# Patient Record
Sex: Female | Born: 1940 | Race: Black or African American | Hispanic: No | Smoking: Former smoker
Health system: Southern US, Community
[De-identification: ages and names within clinical notes are randomized; demographics above are authoritative.]

## PROBLEM LIST (undated history)

## (undated) DIAGNOSIS — I10 Essential (primary) hypertension: Secondary | ICD-10-CM

## (undated) DIAGNOSIS — E785 Hyperlipidemia, unspecified: Secondary | ICD-10-CM

## (undated) DIAGNOSIS — I951 Orthostatic hypotension: Secondary | ICD-10-CM

## (undated) DIAGNOSIS — F0391 Unspecified dementia with behavioral disturbance: Secondary | ICD-10-CM

## (undated) DIAGNOSIS — R634 Abnormal weight loss: Secondary | ICD-10-CM

## (undated) DIAGNOSIS — E049 Nontoxic goiter, unspecified: Secondary | ICD-10-CM

## (undated) DIAGNOSIS — M719 Bursopathy, unspecified: Secondary | ICD-10-CM

## (undated) DIAGNOSIS — E039 Hypothyroidism, unspecified: Secondary | ICD-10-CM

## (undated) DIAGNOSIS — R5381 Other malaise: Secondary | ICD-10-CM

## (undated) DIAGNOSIS — N3941 Urge incontinence: Secondary | ICD-10-CM

## (undated) DIAGNOSIS — E538 Deficiency of other specified B group vitamins: Secondary | ICD-10-CM

## (undated) DIAGNOSIS — M67919 Unspecified disorder of synovium and tendon, unspecified shoulder: Secondary | ICD-10-CM

## (undated) DIAGNOSIS — L602 Onychogryphosis: Secondary | ICD-10-CM

## (undated) DIAGNOSIS — K5710 Diverticulosis of small intestine without perforation or abscess without bleeding: Secondary | ICD-10-CM

## (undated) DIAGNOSIS — I700 Atherosclerosis of aorta: Secondary | ICD-10-CM

## (undated) DIAGNOSIS — L989 Disorder of the skin and subcutaneous tissue, unspecified: Secondary | ICD-10-CM

## (undated) DIAGNOSIS — H5200 Hypermetropia, unspecified eye: Secondary | ICD-10-CM

## (undated) DIAGNOSIS — R413 Other amnesia: Secondary | ICD-10-CM

## (undated) DIAGNOSIS — D500 Iron deficiency anemia secondary to blood loss (chronic): Secondary | ICD-10-CM

## (undated) DIAGNOSIS — K219 Gastro-esophageal reflux disease without esophagitis: Secondary | ICD-10-CM

## (undated) DIAGNOSIS — Z8719 Personal history of other diseases of the digestive system: Secondary | ICD-10-CM

## (undated) HISTORY — DX: Onychogryphosis: L60.2

## (undated) HISTORY — DX: Atherosclerosis of aorta: I70.0

## (undated) HISTORY — DX: Personal history of other diseases of the digestive system: Z87.19

## (undated) HISTORY — DX: Other malaise: R53.81

## (undated) HISTORY — DX: Abnormal weight loss: R63.4

## (undated) HISTORY — DX: Hyperlipidemia, unspecified: E78.5

## (undated) HISTORY — DX: Bursopathy, unspecified: M71.9

## (undated) HISTORY — DX: Hypothyroidism, unspecified: E03.9

## (undated) HISTORY — DX: Essential (primary) hypertension: I10

## (undated) HISTORY — DX: Orthostatic hypotension: I95.1

## (undated) HISTORY — DX: Gastro-esophageal reflux disease without esophagitis: K21.9

## (undated) HISTORY — DX: Urge incontinence: N39.41

## (undated) HISTORY — DX: Unspecified disorder of synovium and tendon, unspecified shoulder: M67.919

## (undated) HISTORY — DX: Deficiency of other specified B group vitamins: E53.8

## (undated) HISTORY — DX: Other amnesia: R41.3

## (undated) HISTORY — DX: Nontoxic goiter, unspecified: E04.9

## (undated) HISTORY — DX: Hypermetropia, unspecified eye: H52.00

## (undated) HISTORY — DX: Iron deficiency anemia secondary to blood loss (chronic): D50.0

## (undated) HISTORY — DX: Unspecified dementia with behavioral disturbance: F03.91

## (undated) HISTORY — DX: Disorder of the skin and subcutaneous tissue, unspecified: L98.9

---

## 1998-10-03 ENCOUNTER — Emergency Department (HOSPITAL_COMMUNITY): Admission: EM | Admit: 1998-10-03 | Discharge: 1998-10-03 | Payer: Self-pay | Admitting: Emergency Medicine

## 1998-10-04 ENCOUNTER — Encounter: Payer: Self-pay | Admitting: Emergency Medicine

## 1998-10-06 ENCOUNTER — Encounter: Admission: RE | Admit: 1998-10-06 | Discharge: 1998-10-06 | Payer: Self-pay | Admitting: Sports Medicine

## 1998-10-16 ENCOUNTER — Encounter: Admission: RE | Admit: 1998-10-16 | Discharge: 1999-01-14 | Payer: Self-pay | Admitting: Cardiology

## 1998-10-23 ENCOUNTER — Encounter: Admission: RE | Admit: 1998-10-23 | Discharge: 1998-10-23 | Payer: Self-pay | Admitting: Family Medicine

## 1998-10-26 ENCOUNTER — Encounter: Admission: RE | Admit: 1998-10-26 | Discharge: 1998-10-26 | Payer: Self-pay | Admitting: Family Medicine

## 1998-10-28 ENCOUNTER — Encounter: Admission: RE | Admit: 1998-10-28 | Discharge: 1998-10-28 | Payer: Self-pay | Admitting: Family Medicine

## 1998-11-11 ENCOUNTER — Encounter: Admission: RE | Admit: 1998-11-11 | Discharge: 1998-11-11 | Payer: Self-pay | Admitting: Sports Medicine

## 1998-11-24 ENCOUNTER — Encounter: Admission: RE | Admit: 1998-11-24 | Discharge: 1998-11-24 | Payer: Self-pay | Admitting: Sports Medicine

## 1998-12-08 ENCOUNTER — Encounter: Admission: RE | Admit: 1998-12-08 | Discharge: 1998-12-08 | Payer: Self-pay | Admitting: Sports Medicine

## 1998-12-15 ENCOUNTER — Encounter: Admission: RE | Admit: 1998-12-15 | Discharge: 1998-12-15 | Payer: Self-pay | Admitting: Family Medicine

## 1999-02-12 ENCOUNTER — Encounter: Admission: RE | Admit: 1999-02-12 | Discharge: 1999-02-12 | Payer: Self-pay | Admitting: Family Medicine

## 1999-03-03 ENCOUNTER — Encounter: Admission: RE | Admit: 1999-03-03 | Discharge: 1999-03-03 | Payer: Self-pay | Admitting: Family Medicine

## 1999-03-08 ENCOUNTER — Inpatient Hospital Stay (HOSPITAL_COMMUNITY): Admission: AD | Admit: 1999-03-08 | Discharge: 1999-03-09 | Payer: Self-pay | Admitting: Family Medicine

## 1999-03-08 ENCOUNTER — Encounter: Admission: RE | Admit: 1999-03-08 | Discharge: 1999-03-08 | Payer: Self-pay | Admitting: Family Medicine

## 1999-03-22 ENCOUNTER — Encounter: Admission: RE | Admit: 1999-03-22 | Discharge: 1999-03-22 | Payer: Self-pay | Admitting: Family Medicine

## 1999-04-14 ENCOUNTER — Encounter: Admission: RE | Admit: 1999-04-14 | Discharge: 1999-04-14 | Payer: Self-pay | Admitting: Family Medicine

## 1999-04-23 ENCOUNTER — Encounter: Admission: RE | Admit: 1999-04-23 | Discharge: 1999-04-23 | Payer: Self-pay | Admitting: Family Medicine

## 1999-06-28 ENCOUNTER — Encounter: Admission: RE | Admit: 1999-06-28 | Discharge: 1999-06-28 | Payer: Self-pay | Admitting: Family Medicine

## 1999-07-06 ENCOUNTER — Encounter: Admission: RE | Admit: 1999-07-06 | Discharge: 1999-07-06 | Payer: Self-pay | Admitting: Sports Medicine

## 1999-07-07 ENCOUNTER — Inpatient Hospital Stay (HOSPITAL_COMMUNITY): Admission: EM | Admit: 1999-07-07 | Discharge: 1999-07-19 | Payer: Self-pay | Admitting: Emergency Medicine

## 1999-07-07 ENCOUNTER — Encounter: Payer: Self-pay | Admitting: Emergency Medicine

## 1999-07-08 ENCOUNTER — Encounter: Payer: Self-pay | Admitting: *Deleted

## 1999-07-09 ENCOUNTER — Encounter: Payer: Self-pay | Admitting: Family Medicine

## 1999-07-11 ENCOUNTER — Encounter: Payer: Self-pay | Admitting: Family Medicine

## 1999-07-12 ENCOUNTER — Encounter: Payer: Self-pay | Admitting: Family Medicine

## 1999-07-17 ENCOUNTER — Encounter: Payer: Self-pay | Admitting: Family Medicine

## 1999-07-28 ENCOUNTER — Encounter: Admission: RE | Admit: 1999-07-28 | Discharge: 1999-07-28 | Payer: Self-pay | Admitting: Family Medicine

## 1999-09-08 ENCOUNTER — Encounter: Admission: RE | Admit: 1999-09-08 | Discharge: 1999-09-08 | Payer: Self-pay | Admitting: Family Medicine

## 1999-11-23 ENCOUNTER — Encounter: Admission: RE | Admit: 1999-11-23 | Discharge: 1999-11-23 | Payer: Self-pay | Admitting: Sports Medicine

## 2000-02-04 ENCOUNTER — Encounter: Admission: RE | Admit: 2000-02-04 | Discharge: 2000-02-04 | Payer: Self-pay | Admitting: Family Medicine

## 2000-02-04 ENCOUNTER — Encounter: Admission: RE | Admit: 2000-02-04 | Discharge: 2000-02-04 | Payer: Self-pay | Admitting: *Deleted

## 2000-03-03 ENCOUNTER — Encounter: Admission: RE | Admit: 2000-03-03 | Discharge: 2000-03-03 | Payer: Self-pay | Admitting: Family Medicine

## 2000-05-16 HISTORY — PX: COLOSTOMY TAKEDOWN: SHX5258

## 2000-09-11 ENCOUNTER — Emergency Department (HOSPITAL_COMMUNITY): Admission: EM | Admit: 2000-09-11 | Discharge: 2000-09-12 | Payer: Self-pay | Admitting: Emergency Medicine

## 2000-09-15 ENCOUNTER — Encounter: Admission: RE | Admit: 2000-09-15 | Discharge: 2000-09-15 | Payer: Self-pay | Admitting: Family Medicine

## 2000-09-20 ENCOUNTER — Encounter: Admission: RE | Admit: 2000-09-20 | Discharge: 2000-09-20 | Payer: Self-pay | Admitting: Family Medicine

## 2000-09-20 ENCOUNTER — Encounter: Admission: RE | Admit: 2000-09-20 | Discharge: 2000-09-20 | Payer: Self-pay | Admitting: Sports Medicine

## 2000-09-20 ENCOUNTER — Encounter: Payer: Self-pay | Admitting: Sports Medicine

## 2000-09-22 HISTORY — PX: APPENDECTOMY: SHX54

## 2000-09-22 HISTORY — PX: COLON SURGERY: SHX602

## 2000-09-24 ENCOUNTER — Encounter (INDEPENDENT_AMBULATORY_CARE_PROVIDER_SITE_OTHER): Payer: Self-pay | Admitting: Specialist

## 2000-09-24 ENCOUNTER — Encounter: Payer: Self-pay | Admitting: Emergency Medicine

## 2000-09-24 ENCOUNTER — Inpatient Hospital Stay (HOSPITAL_COMMUNITY): Admission: EM | Admit: 2000-09-24 | Discharge: 2000-10-11 | Payer: Self-pay | Admitting: Emergency Medicine

## 2000-09-25 ENCOUNTER — Encounter: Payer: Self-pay | Admitting: Family Medicine

## 2000-09-26 ENCOUNTER — Encounter: Payer: Self-pay | Admitting: Family Medicine

## 2000-09-27 ENCOUNTER — Encounter: Payer: Self-pay | Admitting: Family Medicine

## 2000-09-29 ENCOUNTER — Encounter: Payer: Self-pay | Admitting: Family Medicine

## 2000-12-22 ENCOUNTER — Ambulatory Visit (HOSPITAL_COMMUNITY): Admission: RE | Admit: 2000-12-22 | Discharge: 2000-12-22 | Payer: Self-pay | Admitting: Surgery

## 2000-12-22 ENCOUNTER — Encounter: Payer: Self-pay | Admitting: Surgery

## 2001-01-11 ENCOUNTER — Inpatient Hospital Stay (HOSPITAL_COMMUNITY): Admission: RE | Admit: 2001-01-11 | Discharge: 2001-01-18 | Payer: Self-pay | Admitting: Surgery

## 2001-01-11 ENCOUNTER — Encounter (INDEPENDENT_AMBULATORY_CARE_PROVIDER_SITE_OTHER): Payer: Self-pay | Admitting: Specialist

## 2001-02-13 ENCOUNTER — Encounter: Admission: RE | Admit: 2001-02-13 | Discharge: 2001-02-13 | Payer: Self-pay | Admitting: Sports Medicine

## 2001-02-16 ENCOUNTER — Encounter: Payer: Self-pay | Admitting: Sports Medicine

## 2001-02-16 ENCOUNTER — Encounter: Admission: RE | Admit: 2001-02-16 | Discharge: 2001-02-16 | Payer: Self-pay | Admitting: Sports Medicine

## 2001-06-27 ENCOUNTER — Encounter: Admission: RE | Admit: 2001-06-27 | Discharge: 2001-06-27 | Payer: Self-pay | Admitting: Family Medicine

## 2001-07-10 ENCOUNTER — Encounter: Admission: RE | Admit: 2001-07-10 | Discharge: 2001-07-10 | Payer: Self-pay | Admitting: Sports Medicine

## 2002-03-05 ENCOUNTER — Encounter: Admission: RE | Admit: 2002-03-05 | Discharge: 2002-03-05 | Payer: Self-pay | Admitting: Family Medicine

## 2002-11-08 ENCOUNTER — Emergency Department (HOSPITAL_COMMUNITY): Admission: EM | Admit: 2002-11-08 | Discharge: 2002-11-08 | Payer: Self-pay

## 2002-11-26 ENCOUNTER — Encounter: Admission: RE | Admit: 2002-11-26 | Discharge: 2002-11-26 | Payer: Self-pay | Admitting: Family Medicine

## 2002-12-09 ENCOUNTER — Encounter: Admission: RE | Admit: 2002-12-09 | Discharge: 2002-12-09 | Payer: Self-pay | Admitting: Orthopedic Surgery

## 2002-12-09 ENCOUNTER — Encounter: Payer: Self-pay | Admitting: Orthopedic Surgery

## 2002-12-23 ENCOUNTER — Encounter: Admission: RE | Admit: 2002-12-23 | Discharge: 2002-12-23 | Payer: Self-pay | Admitting: Sports Medicine

## 2002-12-30 ENCOUNTER — Encounter: Admission: RE | Admit: 2002-12-30 | Discharge: 2002-12-30 | Payer: Self-pay | Admitting: Family Medicine

## 2003-01-15 ENCOUNTER — Encounter: Payer: Self-pay | Admitting: *Deleted

## 2003-01-15 ENCOUNTER — Encounter: Admission: RE | Admit: 2003-01-15 | Discharge: 2003-01-15 | Payer: Self-pay | Admitting: *Deleted

## 2003-02-05 ENCOUNTER — Encounter: Admission: RE | Admit: 2003-02-05 | Discharge: 2003-02-05 | Payer: Self-pay | Admitting: Family Medicine

## 2003-03-07 ENCOUNTER — Encounter: Admission: RE | Admit: 2003-03-07 | Discharge: 2003-03-07 | Payer: Self-pay | Admitting: Family Medicine

## 2003-04-03 ENCOUNTER — Encounter: Admission: RE | Admit: 2003-04-03 | Discharge: 2003-04-03 | Payer: Self-pay | Admitting: Family Medicine

## 2003-05-26 ENCOUNTER — Encounter: Admission: RE | Admit: 2003-05-26 | Discharge: 2003-05-26 | Payer: Self-pay | Admitting: Sports Medicine

## 2003-06-27 ENCOUNTER — Encounter: Admission: RE | Admit: 2003-06-27 | Discharge: 2003-06-27 | Payer: Self-pay | Admitting: Family Medicine

## 2003-07-28 ENCOUNTER — Encounter: Admission: RE | Admit: 2003-07-28 | Discharge: 2003-07-28 | Payer: Self-pay | Admitting: Family Medicine

## 2003-10-06 ENCOUNTER — Encounter: Admission: RE | Admit: 2003-10-06 | Discharge: 2003-10-06 | Payer: Self-pay | Admitting: Family Medicine

## 2003-10-27 ENCOUNTER — Encounter: Admission: RE | Admit: 2003-10-27 | Discharge: 2003-10-27 | Payer: Self-pay | Admitting: Family Medicine

## 2004-05-05 ENCOUNTER — Ambulatory Visit: Payer: Self-pay | Admitting: Sports Medicine

## 2004-05-19 ENCOUNTER — Ambulatory Visit: Payer: Self-pay | Admitting: Sports Medicine

## 2004-06-09 ENCOUNTER — Ambulatory Visit: Payer: Self-pay | Admitting: Family Medicine

## 2004-07-12 ENCOUNTER — Ambulatory Visit: Payer: Self-pay | Admitting: Family Medicine

## 2004-08-12 ENCOUNTER — Ambulatory Visit: Payer: Self-pay | Admitting: Family Medicine

## 2004-08-27 ENCOUNTER — Ambulatory Visit: Payer: Self-pay | Admitting: Family Medicine

## 2004-09-01 ENCOUNTER — Encounter: Admission: RE | Admit: 2004-09-01 | Discharge: 2004-09-01 | Payer: Self-pay | Admitting: Sports Medicine

## 2004-09-02 ENCOUNTER — Encounter: Admission: RE | Admit: 2004-09-02 | Discharge: 2004-09-02 | Payer: Self-pay | Admitting: Sports Medicine

## 2004-12-14 ENCOUNTER — Ambulatory Visit: Payer: Self-pay | Admitting: Sports Medicine

## 2004-12-20 ENCOUNTER — Ambulatory Visit: Payer: Self-pay | Admitting: Family Medicine

## 2004-12-29 ENCOUNTER — Ambulatory Visit: Payer: Self-pay | Admitting: Family Medicine

## 2005-01-12 ENCOUNTER — Ambulatory Visit: Payer: Self-pay | Admitting: Family Medicine

## 2005-02-10 ENCOUNTER — Ambulatory Visit: Payer: Self-pay | Admitting: Family Medicine

## 2005-03-09 ENCOUNTER — Ambulatory Visit: Payer: Self-pay | Admitting: Family Medicine

## 2005-04-14 ENCOUNTER — Ambulatory Visit: Payer: Self-pay | Admitting: Sports Medicine

## 2005-05-13 ENCOUNTER — Ambulatory Visit: Payer: Self-pay | Admitting: Family Medicine

## 2005-08-09 ENCOUNTER — Ambulatory Visit: Payer: Self-pay | Admitting: Sports Medicine

## 2005-08-10 ENCOUNTER — Ambulatory Visit: Admission: RE | Admit: 2005-08-10 | Discharge: 2005-08-10 | Payer: Self-pay | Admitting: Family Medicine

## 2006-04-15 ENCOUNTER — Encounter (INDEPENDENT_AMBULATORY_CARE_PROVIDER_SITE_OTHER): Payer: Self-pay | Admitting: *Deleted

## 2006-04-15 LAB — CONVERTED CEMR LAB

## 2006-04-19 ENCOUNTER — Ambulatory Visit: Payer: Self-pay | Admitting: Family Medicine

## 2006-04-28 ENCOUNTER — Ambulatory Visit: Payer: Self-pay | Admitting: Sports Medicine

## 2006-05-03 ENCOUNTER — Ambulatory Visit (HOSPITAL_COMMUNITY): Admission: RE | Admit: 2006-05-03 | Discharge: 2006-05-03 | Payer: Self-pay | Admitting: Family Medicine

## 2006-05-03 ENCOUNTER — Ambulatory Visit: Payer: Self-pay | Admitting: Family Medicine

## 2006-07-13 DIAGNOSIS — E039 Hypothyroidism, unspecified: Secondary | ICD-10-CM | POA: Insufficient documentation

## 2006-07-13 DIAGNOSIS — E785 Hyperlipidemia, unspecified: Secondary | ICD-10-CM

## 2006-07-13 DIAGNOSIS — I10 Essential (primary) hypertension: Secondary | ICD-10-CM

## 2006-07-13 HISTORY — DX: Hyperlipidemia, unspecified: E78.5

## 2006-07-13 HISTORY — DX: Hypothyroidism, unspecified: E03.9

## 2006-07-13 HISTORY — DX: Essential (primary) hypertension: I10

## 2006-07-14 ENCOUNTER — Encounter (INDEPENDENT_AMBULATORY_CARE_PROVIDER_SITE_OTHER): Payer: Self-pay | Admitting: *Deleted

## 2006-10-02 ENCOUNTER — Ambulatory Visit: Payer: Self-pay | Admitting: Family Medicine

## 2006-10-02 ENCOUNTER — Encounter (INDEPENDENT_AMBULATORY_CARE_PROVIDER_SITE_OTHER): Payer: Self-pay | Admitting: Family Medicine

## 2006-10-06 ENCOUNTER — Encounter (INDEPENDENT_AMBULATORY_CARE_PROVIDER_SITE_OTHER): Payer: Self-pay | Admitting: Family Medicine

## 2006-10-06 LAB — CONVERTED CEMR LAB
ALT: 10 units/L (ref 0–35)
AST: 16 units/L (ref 0–37)
Alkaline Phosphatase: 42 units/L (ref 39–117)
Calcium: 9.3 mg/dL (ref 8.4–10.5)
Creatinine, Ser: 1.07 mg/dL (ref 0.40–1.20)
Potassium: 4.3 meq/L (ref 3.5–5.3)
Sodium: 143 meq/L (ref 135–145)
TSH: 15.379 microintl units/mL — ABNORMAL HIGH (ref 0.350–5.50)
Total Protein: 7.2 g/dL (ref 6.0–8.3)

## 2006-12-01 ENCOUNTER — Telehealth (INDEPENDENT_AMBULATORY_CARE_PROVIDER_SITE_OTHER): Payer: Self-pay | Admitting: Family Medicine

## 2007-02-14 ENCOUNTER — Ambulatory Visit: Payer: Self-pay | Admitting: Family Medicine

## 2007-02-14 ENCOUNTER — Encounter (INDEPENDENT_AMBULATORY_CARE_PROVIDER_SITE_OTHER): Payer: Self-pay | Admitting: Family Medicine

## 2007-02-14 ENCOUNTER — Telehealth (INDEPENDENT_AMBULATORY_CARE_PROVIDER_SITE_OTHER): Payer: Self-pay | Admitting: Family Medicine

## 2007-02-16 ENCOUNTER — Telehealth (INDEPENDENT_AMBULATORY_CARE_PROVIDER_SITE_OTHER): Payer: Self-pay | Admitting: Family Medicine

## 2007-02-16 DIAGNOSIS — N19 Unspecified kidney failure: Secondary | ICD-10-CM | POA: Insufficient documentation

## 2007-02-16 LAB — CONVERTED CEMR LAB
Chloride: 107 meq/L (ref 96–112)
Cholesterol: 247 mg/dL — ABNORMAL HIGH (ref 0–200)
Free T4: 0.9 ng/dL (ref 0.89–1.80)
Glucose, Bld: 83 mg/dL (ref 70–99)
HDL: 63 mg/dL (ref >39)
Potassium: 4.6 meq/L (ref 3.5–5.3)
TSH: 11.795 microintl units/mL — ABNORMAL HIGH (ref 0.350–5.50)

## 2007-02-20 ENCOUNTER — Ambulatory Visit (HOSPITAL_COMMUNITY): Admission: RE | Admit: 2007-02-20 | Discharge: 2007-02-20 | Payer: Self-pay | Admitting: Family Medicine

## 2007-02-20 ENCOUNTER — Encounter: Payer: Self-pay | Admitting: Family Medicine

## 2007-02-21 ENCOUNTER — Ambulatory Visit: Payer: Self-pay | Admitting: Family Medicine

## 2007-02-21 ENCOUNTER — Encounter (INDEPENDENT_AMBULATORY_CARE_PROVIDER_SITE_OTHER): Payer: Self-pay | Admitting: Family Medicine

## 2007-02-21 LAB — CONVERTED CEMR LAB
Albumin, U: DETECTED %
Alpha-1-Globulin: 4.1 % (ref 2.9–4.9)
BUN: 19 mg/dL (ref 6–23)
CO2: 26 meq/L (ref 19–32)
Calcium: 9.7 mg/dL (ref 8.4–10.5)
Chloride: 107 meq/L (ref 96–112)
Creatinine, Ser: 1.1 mg/dL (ref 0.40–1.20)
Free Lambda Lt Chains,Ur: 0.85 mg/dL (ref 0.08–1.01)
Gamma Globulin, Urine: DETECTED % — AB
Sodium: 141 meq/L (ref 135–145)
Total Protein, Serum Electrophoresis: 7.5 g/dL (ref 6.0–8.3)

## 2007-02-22 ENCOUNTER — Encounter (INDEPENDENT_AMBULATORY_CARE_PROVIDER_SITE_OTHER): Payer: Self-pay | Admitting: Family Medicine

## 2007-02-23 ENCOUNTER — Ambulatory Visit: Payer: Self-pay | Admitting: Family Medicine

## 2007-02-23 ENCOUNTER — Encounter (INDEPENDENT_AMBULATORY_CARE_PROVIDER_SITE_OTHER): Payer: Self-pay | Admitting: Family Medicine

## 2007-02-23 LAB — CONVERTED CEMR LAB
Collection Interval-CRCL: 24 hr
Creatinine 24 HR UR: 836 mg/24hr (ref 700–1800)
Creatinine Clearance: 53 mL/min — ABNORMAL LOW (ref 75–115)
Creatinine, Urine: 55.8 mg/dL

## 2007-03-20 ENCOUNTER — Encounter: Admission: RE | Admit: 2007-03-20 | Discharge: 2007-03-20 | Payer: Self-pay | Admitting: Family Medicine

## 2007-03-21 ENCOUNTER — Encounter (INDEPENDENT_AMBULATORY_CARE_PROVIDER_SITE_OTHER): Payer: Self-pay | Admitting: Family Medicine

## 2007-03-28 ENCOUNTER — Encounter (INDEPENDENT_AMBULATORY_CARE_PROVIDER_SITE_OTHER): Payer: Self-pay | Admitting: Family Medicine

## 2007-03-28 ENCOUNTER — Ambulatory Visit: Payer: Self-pay | Admitting: Family Medicine

## 2007-03-28 LAB — CONVERTED CEMR LAB
CO2: 28 meq/L (ref 19–32)
Creatinine, Ser: 1.42 mg/dL — ABNORMAL HIGH (ref 0.40–1.20)
Glucose, Bld: 62 mg/dL — ABNORMAL LOW (ref 70–99)
Potassium: 4.3 meq/L (ref 3.5–5.3)

## 2007-03-29 ENCOUNTER — Encounter (INDEPENDENT_AMBULATORY_CARE_PROVIDER_SITE_OTHER): Payer: Self-pay | Admitting: Family Medicine

## 2007-04-03 ENCOUNTER — Encounter: Payer: Self-pay | Admitting: Sports Medicine

## 2007-04-26 ENCOUNTER — Encounter (INDEPENDENT_AMBULATORY_CARE_PROVIDER_SITE_OTHER): Payer: Self-pay | Admitting: Family Medicine

## 2007-04-27 ENCOUNTER — Telehealth: Payer: Self-pay | Admitting: *Deleted

## 2007-04-28 ENCOUNTER — Emergency Department (HOSPITAL_COMMUNITY): Admission: EM | Admit: 2007-04-28 | Discharge: 2007-04-28 | Payer: Self-pay | Admitting: Emergency Medicine

## 2007-04-28 ENCOUNTER — Emergency Department (HOSPITAL_COMMUNITY): Admission: EM | Admit: 2007-04-28 | Discharge: 2007-04-28 | Payer: Self-pay | Admitting: *Deleted

## 2007-05-04 ENCOUNTER — Ambulatory Visit: Payer: Self-pay | Admitting: Family Medicine

## 2007-05-04 ENCOUNTER — Encounter (INDEPENDENT_AMBULATORY_CARE_PROVIDER_SITE_OTHER): Payer: Self-pay | Admitting: Family Medicine

## 2007-05-07 LAB — CONVERTED CEMR LAB
MCHC: 32.2 g/dL (ref 30.0–36.0)
Platelets: 346 10*3/uL (ref 150–400)
RBC: 2.81 M/uL — ABNORMAL LOW (ref 3.87–5.11)
RDW: 14.9 % (ref 11.5–15.5)
WBC: 6.3 10*3/uL (ref 4.0–10.5)

## 2007-05-16 ENCOUNTER — Ambulatory Visit: Payer: Self-pay | Admitting: Family Medicine

## 2007-05-16 ENCOUNTER — Encounter: Admission: RE | Admit: 2007-05-16 | Discharge: 2007-05-16 | Payer: Self-pay | Admitting: Family Medicine

## 2007-05-16 ENCOUNTER — Encounter (INDEPENDENT_AMBULATORY_CARE_PROVIDER_SITE_OTHER): Payer: Self-pay | Admitting: Family Medicine

## 2007-05-16 DIAGNOSIS — D649 Anemia, unspecified: Secondary | ICD-10-CM

## 2007-05-16 LAB — CONVERTED CEMR LAB
CO2: 24 meq/L (ref 19–32)
Calcium: 9.7 mg/dL (ref 8.4–10.5)
Chloride: 104 meq/L (ref 96–112)
T3, Free: 2.1 pg/mL — ABNORMAL LOW (ref 2.3–4.2)
TSH: 5.108 microintl units/mL (ref 0.350–5.50)

## 2007-05-21 ENCOUNTER — Encounter (INDEPENDENT_AMBULATORY_CARE_PROVIDER_SITE_OTHER): Payer: Self-pay | Admitting: Family Medicine

## 2007-06-06 ENCOUNTER — Ambulatory Visit: Payer: Self-pay | Admitting: Family Medicine

## 2007-09-12 ENCOUNTER — Encounter (INDEPENDENT_AMBULATORY_CARE_PROVIDER_SITE_OTHER): Payer: Self-pay | Admitting: Family Medicine

## 2007-09-12 ENCOUNTER — Ambulatory Visit: Payer: Self-pay | Admitting: Family Medicine

## 2007-09-19 LAB — CONVERTED CEMR LAB
AST: 16 units/L (ref 0–37)
Alkaline Phosphatase: 42 units/L (ref 39–117)
BUN: 28 mg/dL — ABNORMAL HIGH (ref 6–23)
Calcium: 9.6 mg/dL (ref 8.4–10.5)
Direct LDL: 157 mg/dL — ABNORMAL HIGH
Sodium: 140 meq/L (ref 135–145)
TSH: 17.717 microintl units/mL — ABNORMAL HIGH (ref 0.350–5.50)
Total Protein: 7.6 g/dL (ref 6.0–8.3)

## 2007-10-31 ENCOUNTER — Encounter (INDEPENDENT_AMBULATORY_CARE_PROVIDER_SITE_OTHER): Payer: Self-pay | Admitting: Family Medicine

## 2007-10-31 ENCOUNTER — Ambulatory Visit: Payer: Self-pay | Admitting: Family Medicine

## 2007-11-21 ENCOUNTER — Ambulatory Visit: Payer: Self-pay | Admitting: Family Medicine

## 2007-11-22 ENCOUNTER — Encounter: Payer: Self-pay | Admitting: Family Medicine

## 2007-11-22 ENCOUNTER — Ambulatory Visit: Payer: Self-pay | Admitting: Family Medicine

## 2007-11-22 LAB — CONVERTED CEMR LAB
Platelets: 311 10*3/uL (ref 150–400)
RBC: 3.86 M/uL — ABNORMAL LOW (ref 3.87–5.11)

## 2007-11-23 ENCOUNTER — Encounter: Payer: Self-pay | Admitting: Family Medicine

## 2008-01-30 ENCOUNTER — Encounter: Payer: Self-pay | Admitting: *Deleted

## 2008-02-08 ENCOUNTER — Telehealth: Payer: Self-pay | Admitting: Family Medicine

## 2008-02-08 ENCOUNTER — Ambulatory Visit: Payer: Self-pay | Admitting: Family Medicine

## 2008-02-08 ENCOUNTER — Encounter: Payer: Self-pay | Admitting: Family Medicine

## 2008-02-08 LAB — CONVERTED CEMR LAB
Glucose, Urine, Semiquant: NEGATIVE
Pap Smear: NORMAL
Pap Smear: NORMAL
Protein, U semiquant: NEGATIVE
Urobilinogen, UA: 0.2
pH: 5.5

## 2008-02-09 ENCOUNTER — Encounter: Payer: Self-pay | Admitting: Family Medicine

## 2008-02-14 ENCOUNTER — Encounter: Payer: Self-pay | Admitting: Family Medicine

## 2008-03-20 ENCOUNTER — Encounter: Admission: RE | Admit: 2008-03-20 | Discharge: 2008-03-20 | Payer: Self-pay | Admitting: Family Medicine

## 2008-03-24 ENCOUNTER — Telehealth: Payer: Self-pay | Admitting: *Deleted

## 2008-04-24 ENCOUNTER — Ambulatory Visit: Payer: Self-pay | Admitting: Family Medicine

## 2008-04-24 ENCOUNTER — Telehealth: Payer: Self-pay | Admitting: *Deleted

## 2008-04-24 DIAGNOSIS — M25519 Pain in unspecified shoulder: Secondary | ICD-10-CM | POA: Insufficient documentation

## 2008-06-04 ENCOUNTER — Encounter: Payer: Self-pay | Admitting: Family Medicine

## 2008-07-25 ENCOUNTER — Emergency Department (HOSPITAL_COMMUNITY): Admission: EM | Admit: 2008-07-25 | Discharge: 2008-07-25 | Payer: Self-pay | Admitting: Emergency Medicine

## 2008-08-18 ENCOUNTER — Ambulatory Visit: Payer: Self-pay | Admitting: Family Medicine

## 2008-11-20 ENCOUNTER — Telehealth: Payer: Self-pay | Admitting: Family Medicine

## 2008-11-21 ENCOUNTER — Ambulatory Visit: Payer: Self-pay | Admitting: Family Medicine

## 2009-02-25 ENCOUNTER — Encounter: Payer: Self-pay | Admitting: Family Medicine

## 2009-03-04 ENCOUNTER — Ambulatory Visit: Payer: Self-pay | Admitting: Family Medicine

## 2009-03-04 ENCOUNTER — Encounter: Payer: Self-pay | Admitting: Family Medicine

## 2009-03-04 DIAGNOSIS — K219 Gastro-esophageal reflux disease without esophagitis: Secondary | ICD-10-CM

## 2009-03-04 HISTORY — DX: Gastro-esophageal reflux disease without esophagitis: K21.9

## 2009-03-05 ENCOUNTER — Encounter: Payer: Self-pay | Admitting: Family Medicine

## 2009-03-05 ENCOUNTER — Telehealth: Payer: Self-pay | Admitting: Family Medicine

## 2009-03-05 LAB — CONVERTED CEMR LAB
BUN: 29 mg/dL — ABNORMAL HIGH (ref 6–23)
Chloride: 105 meq/L (ref 96–112)
Cholesterol: 229 mg/dL — ABNORMAL HIGH (ref 0–200)
Creatinine, Ser: 1.11 mg/dL (ref 0.40–1.20)
LDL Cholesterol: 135 mg/dL — ABNORMAL HIGH (ref 0–99)
TSH: 12.913 microintl units/mL — ABNORMAL HIGH (ref 0.350–4.500)
Total CHOL/HDL Ratio: 3.2
Triglycerides: 112 mg/dL (ref <150)
VLDL: 22 mg/dL (ref 0–40)

## 2009-03-23 ENCOUNTER — Telehealth: Payer: Self-pay | Admitting: Family Medicine

## 2009-03-23 ENCOUNTER — Ambulatory Visit: Payer: Self-pay | Admitting: Family Medicine

## 2009-03-23 DIAGNOSIS — H53429 Scotoma of blind spot area, unspecified eye: Secondary | ICD-10-CM

## 2009-03-31 ENCOUNTER — Encounter: Admission: RE | Admit: 2009-03-31 | Discharge: 2009-03-31 | Payer: Self-pay | Admitting: Family Medicine

## 2009-04-07 ENCOUNTER — Encounter: Payer: Self-pay | Admitting: Family Medicine

## 2009-04-21 ENCOUNTER — Encounter: Payer: Self-pay | Admitting: Family Medicine

## 2009-04-21 ENCOUNTER — Ambulatory Visit: Payer: Self-pay | Admitting: Family Medicine

## 2009-04-22 ENCOUNTER — Encounter: Payer: Self-pay | Admitting: Family Medicine

## 2009-09-21 ENCOUNTER — Telehealth: Payer: Self-pay | Admitting: Family Medicine

## 2009-09-22 ENCOUNTER — Encounter: Payer: Self-pay | Admitting: Family Medicine

## 2009-09-22 ENCOUNTER — Ambulatory Visit: Payer: Self-pay | Admitting: Family Medicine

## 2009-10-16 ENCOUNTER — Ambulatory Visit: Payer: Self-pay | Admitting: Family Medicine

## 2009-10-16 DIAGNOSIS — M719 Bursopathy, unspecified: Secondary | ICD-10-CM

## 2009-10-16 DIAGNOSIS — M67919 Unspecified disorder of synovium and tendon, unspecified shoulder: Secondary | ICD-10-CM

## 2009-10-16 HISTORY — DX: Unspecified disorder of synovium and tendon, unspecified shoulder: M67.919

## 2009-12-07 ENCOUNTER — Ambulatory Visit: Payer: Self-pay | Admitting: Family Medicine

## 2009-12-07 ENCOUNTER — Encounter: Payer: Self-pay | Admitting: Family Medicine

## 2009-12-07 DIAGNOSIS — R1032 Left lower quadrant pain: Secondary | ICD-10-CM

## 2009-12-07 LAB — CONVERTED CEMR LAB
ALT: 12 units/L (ref 0–35)
AST: 19 units/L (ref 0–37)
Alkaline Phosphatase: 42 units/L (ref 39–117)
BUN: 25 mg/dL — ABNORMAL HIGH (ref 6–23)
Calcium: 9.8 mg/dL (ref 8.4–10.5)
Chloride: 106 meq/L (ref 96–112)
Creatinine, Ser: 1.3 mg/dL — ABNORMAL HIGH (ref 0.40–1.20)
Eosinophils Absolute: 0.1 10*3/uL (ref 0.0–0.7)
Eosinophils Relative: 3 % (ref 0–5)
HCT: 35.3 % — ABNORMAL LOW (ref 36.0–46.0)
Lymphocytes Relative: 34 % (ref 12–46)
Lymphs Abs: 1.6 10*3/uL (ref 0.7–4.0)
Neutro Abs: 2.3 10*3/uL (ref 1.7–7.7)
Neutrophils Relative %: 50 % (ref 43–77)
RBC: 3.74 M/uL — ABNORMAL LOW (ref 3.87–5.11)
RDW: 16.1 % — ABNORMAL HIGH (ref 11.5–15.5)
Total Protein: 6.9 g/dL (ref 6.0–8.3)

## 2009-12-11 ENCOUNTER — Telehealth: Payer: Self-pay | Admitting: Family Medicine

## 2010-05-13 ENCOUNTER — Ambulatory Visit: Payer: Self-pay | Admitting: Family Medicine

## 2010-06-10 ENCOUNTER — Encounter (INDEPENDENT_AMBULATORY_CARE_PROVIDER_SITE_OTHER): Payer: Self-pay | Admitting: *Deleted
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
## 2010-06-15 NOTE — Assessment & Plan Note (Signed)
Summary: Beth Harper   Vital Signs:  Patient profile:   70 year old female BP sitting:   132 / 89  Vitals Entered By: Lillia Pauls CMA (October 16, 2009 9:49 AM)  Primary Care Provider:  Romero Belling MD   History of Present Illness: Pt presents with bilateral shoulder pain that has been present for the past two months but getting worse over the past 2-3 weeks. She has had shoulder problems for years and has had steroid injections in the past which have bene helpful. She is right handed and has more pain in her right shoulder. She has tried both Naproxen and tramadol. The tramadol was not as helpful and the Naproxen made her drowsy per her report. She has used a muscle rub which has been helpful. She has difficulty doing anything over her head and behind her back. No recent injuries.   Allergies: 1)  ! * Seafood  Physical Exam  General:  alert and well-developed.   Head:  normocephalic and atraumatic.   Ears:  Normal hearing Mouth:  MMM Neck:  supple.   Lungs:  normal respiratory effort.   Msk:  Bilateral Shoulders: Normal inspection without bruising or edema Forward flexion to 115 degrees on the left actively but to 130 passively with resistance Abduction to 95 degrees actively bilaterally and passively to 125 degrees + Neer's, Empty Can, Hawkin's, Cross over and Speed's bilaterally 4+/5 strength with resisted internal and external rotation + TTP over bilateral biceps tendon and some over her right AC joint Able to put her hands behind her back to L4 bilaterally   Impression & Recommendations:  Problem # 1:  SHOULDER PAIN, BILATERAL (ICD-719.41)  Consistent with frozen shoulders bilaterally and rotator cuff tendinitis 1. Consented patient for bilateral shoulder injections.  Consent obtained and verified. Sterile betadine prep. Furthur cleansed with alcohol. Topical analgesic spray: Ethyl chloride. Joint: Bilateral Shoulders Approached in typical fashion with:  Posteriorly bilaterally Completed without difficulty Meds: In each injection: 40mg  of Kenalog and 6 cc of 1% Lidocaine Needle: 25 guage Aftercare instructions and Red flags advised.  2. Patient would prefer to stop Naproxen  The following medications were removed from the medication list:    Naproxen 500 Mg Tabs (Naproxen) .Marland Kitchen... 1 by mouth two times a day as needed pain. Her updated medication list for this problem includes:    Ecotrin Low Strength 81 Mg Tbec (Aspirin) .Marland Kitchen... Take 1 tablet by mouth every morning    Ultram 50 Mg Tabs (Tramadol hcl) .Marland Kitchen... 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 Given a handout with simple ROM exercises including Codman's and wall walking 2. Consider theraband exercises at next office visit 3. Return in 4-6 weeks for follow-up  Orders: Joint Aspirate / Injection, Large (20610) Triamcinolone (Kenalog) 10mg  (J3301)  Complete Medication List: 1)  Ecotrin Low Strength 81 Mg Tbec (Aspirin) .... Take 1 tablet by mouth every morning 2)  Hydrochlorothiazide 25 Mg Tabs (Hydrochlorothiazide) .... Take 1 tab by mouth every morning 3)  Simvastatin 40 Mg Tabs (Simvastatin) .Marland Kitchen.. 1 tab by mouth daily for cholesterol 4)  Lisinopril 40 Mg Tabs (Lisinopril) .Marland Kitchen.. 1 by mouth once daily 5)  Ultram 50 Mg Tabs (Tramadol hcl) .... Take 1-2 tabs by mouth at night for pain 6)  Ranitidine Hcl 150 Mg Tabs (Ranitidine hcl) .Marland Kitchen.. 1 tab by mouth two times a day as needed  for gerd 7)  Levothyroxine Sodium 50 Mcg Tabs (Levothyroxine sodium) .Marland Kitchen.. 1 by mouth once daily for thyroid.
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
## 2010-06-15 NOTE — Progress Notes (Signed)
Summary: Rx Req  Phone Note Refill Request Call back at Home Phone [phone] Message from:  Patient  Refills Requested: Medication #1:  LISINOPRIL 40 MG TABS 1 by mouth once daily  Medication #2:  ULTRAM 50 MG TABS take 1-2 tabs by mouth at night for pain  Medication #3:  SIMVASTATIN 40 MG TABS 1 tab by mouth daily for cholesterol  Medication #4:  RANITIDINE HCL 150 MG TABS 1 tab by mouth two times a day as needed for [facility]  Initial call taken by: Clydell Hakim,  Sep 21, 2009 9:39 AM  Follow-up for Phone Call        to pcp Follow-up by: Theresia Lo RN,  Sep 21, 2009 10:26 AM    Prescriptions: RANITIDINE HCL 150 MG TABS (RANITIDINE HCL) 1 tab by mouth two times a day as needed for GERD  #60 x 0   Entered by:   Asher Muir MD   Authorized by:   Romero Belling MD   Signed by:   Asher Muir MD on 09/22/2009   Method used:   Electronically to        [facility] [address] #[id number]* (retail)       [address] [place], [place]  [address]       Ph: [phone]       Fax: [phone]   RxID:   [id number] ULTRAM 50 MG TABS (TRAMADOL HCL) take 1-2 tabs by mouth at night for pain  #30 x 1   Entered by:   Asher Muir MD   Authorized by:   Romero Belling MD   Signed by:   Asher Muir MD on 09/22/2009   Method used:   Electronically to        [facility] #[id number]* (retail)       [address] [place], [place]  [address]       Ph: [phone]       Fax: [phone]   RxID:   [id number] LISINOPRIL 40 MG TABS (LISINOPRIL) 1 by mouth once daily  #30 x 1   Entered by:   Asher Muir MD   Authorized by:   Romero Belling MD   Signed by:   Asher Muir MD on 09/22/2009   Method used:   Electronically to        [facility] #[id number]* (retail)       [address] [place], [place]  [address]       Ph: [phone]       Fax: [phone]   RxID:   [id number] SIMVASTATIN 40 MG TABS  (SIMVASTATIN) 1 tab by mouth daily for cholesterol  #30 x 1   Entered by:   Asher Muir MD   Authorized by:   Romero Belling MD   Signed by:   Asher Muir MD on 09/22/2009   Method used:   Electronically to        [facility] #[id number]* (retail)       [address] [place], [place]  [address]       Ph: [phone]       Fax: [phone]   RxID:   [id number]

## 2010-06-15 NOTE — Progress Notes (Signed)
Summary: results  Phone Note Call from Patient Call back at Home Phone [phone]   Caller: Patient Summary of Call: wants results of labs - still in pain Initial call taken by: De Nurse,  December 11, 2009 9:41 AM  Follow-up for Phone Call        told her there were a few numbers that were over the limit but nothing excessive.   states she still hurts. told her to take meds as ordered & it will get better with time. has appt at [facility] for f/u. she agreed with plan Follow-up by: Golden Circle RN,  December 11, 2009 9:43 AM

## 2010-06-15 NOTE — Letter (Signed)
Summary: Generic Letter  [facility]  [address]   [place], [place] [address]   Phone: [phone]  Fax: [phone]    03/29/2007  NICOLENA SCHURMAN [address] [place], [place]  [address]  Dear Ms. Sigmund,  Your kidney function is where I expected it to be.  Continue on the current dosage of LISINOPRIL 20mg  plus LISINOPRIL 20/HCTZ 25.  Also, remember to take your THYROID (levothyroxine) medication.  We will see you back in Early January 09 to follow up labwork.  Below is a copy of your results.    Sodium                    142 mEq/L                   135-145   Potassium                 4.3 mEq/L                   3.5-5.3   Chloride                  103 mEq/L                   96-112   CO2                       28 mEq/L                    19-32   Glucose                   62 mg/dL                    57-84   BUN                       25 mg/dL                    6-96   **Creatinine           [H]  1.42 mg/dL                  0.40-1.20   Calcium                   10.2 mg/dL                  2.9-52.8  **this is where I expected your kidney function to raise a little bit, but it is in the exceptable range.  I expect this number to come back down over a few months to about 1.1 or 1.2.     Sincerely,   Shahidah Nesbitt  MD [facility]  Appended Document: Generic Letter MAILED LETTER TO PT

## 2010-06-17 NOTE — Assessment & Plan Note (Signed)
Summary: f/u,df   Vital Signs:  Patient profile:   70 year old female Height:      62 inches Weight:      133.1 pounds BMI:     24.43 Temp:     98.3 degrees F oral Pulse rate:   80 / minute BP sitting:   147 / 80  (left arm) Cuff size:   regular  Vitals Entered By: Garen Grams LPN (May 13, 2010 3:01 PM) CC: f/u bp, shoulderpain, refills Is Patient Diabetic? No Pain Assessment Patient in pain? yes     Location: shoulders   Primary Beth Harper:  Beth Phi MD  CC:  f/u bp, shoulderpain, and refills.  History of Present Illness: Pt here to discuss shoulder pain and to obtain refills on medications.   Shoulder pain- pt has been diagnosed with rotator cuff syndrome. She has had injections in the past that have helped the pain for a short amount of time. She states that  she has persistent shoulder pain, dull pain,  b/l, but worse in L shoulder,  worse at night, and worse when lying on her L side. The pain does limit functionality. She denies fever, chills, weightloss, rashes.   HTN- Pt BP well controlled currently. She denies CP, SOB, cough, She takes her medicatiosn as prescribed.   Screening./Prevention- last Colonoscopy in 2008-nml. Mammogram 2010-nml. Last pneumovax 2001 @ age 4. Flu shot @ [facility].   Allergies: 1)  ! * Seafood  Social History: Lives and has custody of with granddaughter [name] Powel).  Nonsmoker, stopped in 2004 after 30 years smoking.  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  Review of Systems       As per HPI  Physical Exam  General:  Vital signs reviewed.  alert and well-developed.   Lungs:  normal respiratory effort.   Heart:  Normal rate and regular rhythm. S1 and S2 normal without gallop, murmur, click, rub or other extra sounds.   Shoulder/Elbow Exam  General:    Well-developed, well-nourished, normal body habitus; no deformities, normal grooming.    Skin:    Intact, no scars,  lesions, rashes, cafe au lait spots or bruising.    Inspection:    Inspection is normal.    Palpation:    Non-tender to palpation bilaterally.   Limited passive ROM to 90 degrees.   Vascular:    Radial, ulnar, brachial, and axillary pulses 2+ and symmetric; capillary refill less than 2 seconds; no evidence of ischemia, clubbing, or cyanosis.     Impression & Recommendations:  Problem # 1:  SHOULDER PAIN, LEFT (ICD-719.41) Assessment Unchanged She pt instructions for treatment plan.  The following medications were removed from the medication list:    Naproxen 500 Mg Tabs (Naproxen) .Marland Kitchen... 1 by mouth two times a day for 2 weeks then as needed for shoulder pain Her updated medication list for this problem includes:    Ecotrin Low Strength 81 Mg Tbec (Aspirin) .Marland Kitchen... Take 1 tablet by mouth every morning    Ultram 50 Mg Tabs (Tramadol hcl) .Marland Kitchen... Take 1-2 tabs by mouth at night for pain    Aminofen 500 Mg Tabs (Acetaminophen) ..... One tab every 6 hrs  Orders: [facility]- Est  Level 4 ([address])  Problem # 2:  HYPERTENSION, BENIGN SYSTEMIC (ICD-401.1) Assessment: Improved Well controlled. Check  CMET for Cr. Her updated medication list for this problem includes:    Hydrochlorothiazide 25 Mg Tabs (Hydrochlorothiazide) .Marland Kitchen... Take 1 tab  by mouth every morning    Lisinopril 40 Mg Tabs (Lisinopril) .Marland Kitchen... 1 by mouth once daily  Orders: [facility]- Est  Level 4 ([address])Future Orders: Comp Met-[facility] ([id number]) ... 04/16/2011  Problem # 3:  HYPOTHYROIDISM, UNSPECIFIED (ICD-244.9) Assessment: Comment Only Check TSH Her updated medication list for this problem includes:    Levothyroxine Sodium 50 Mcg Tabs (Levothyroxine sodium) .Marland Kitchen... 1 by mouth once daily for thyroid.  Orders: [facility]- Est  Level 4 ([address])Future Orders: TSH-[facility] ([id number]) ... 04/16/2011  Problem # 4:  HYPERLIPIDEMIA (ICD-272.4) Check FLP Her updated medication list for this problem includes:    Simvastatin 40 Mg Tabs (Simvastatin)  .Marland Kitchen... 1 tab by mouth daily for cholesterol  Orders: [facility]- Est  Level 4 ([address])Future Orders: Lipid-[facility] ([id number]) ... 04/16/2011  Problem # 5:  Preventive Health Care (ICD-V70.0) Mammogram referral Pneumovax give today H. Zoster script/info given today  Complete Medication List: 1)  Ecotrin Low Strength 81 Mg Tbec (Aspirin) .... Take 1 tablet by mouth every morning 2)  Hydrochlorothiazide 25 Mg Tabs (Hydrochlorothiazide) .... Take 1 tab by mouth every morning 3)  Simvastatin 40 Mg Tabs (Simvastatin) .Marland Kitchen.. 1 tab by mouth daily for cholesterol 4)  Lisinopril 40 Mg Tabs (Lisinopril) .Marland Kitchen.. 1 by mouth once daily 5)  Ultram 50 Mg Tabs (Tramadol hcl) .... Take 1-2 tabs by mouth at night for pain 6)  Ranitidine Hcl 150 Mg Tabs (Ranitidine hcl) .Marland Kitchen.. 1 tab by mouth two times a day as needed for gerd 7)  Levothyroxine Sodium 50 Mcg Tabs (Levothyroxine sodium) .Marland Kitchen.. 1 by mouth once daily for thyroid. 8)  Zostavax 57846 Unt/0.3ml Solr (Zoster vaccine live) .... One injection im 9)  Aminofen 500 Mg Tabs (Acetaminophen) .... One tab every 6 hrs  Other Orders: Mammogram (Screening) (Mammo) Pneumococcal Vaccine (96295) Admin 1st Vaccine (28413)  Patient Instructions: 1)  Beth Harper, 2)  It was a pleasure to meet you. 3)  I will send a referral for a mammogram. 4)  For your shoulder- take the tylenol every 6 hrs schedule. Continue tramadol at night. Continue the topical ointments. Continue your exercise.  5)  Please come to the lab one day when you are fasting for your blood work.  6)  Take the prescription for the H. Zoster vaccine to the pharmacies listed.  7)  f/u in 2-3  weeks for lab work and R shoulder injection.  8)  I will let you know how your lab work turns out.  9)  -Dr. Armen Pickup  Prescriptions: ZOSTAVAX 24401 UNT/0.65ML SOLR (ZOSTER VACCINE LIVE) one injection IM  #1 x 0   Entered and Authorized by:   Beth Phi MD   Signed by:   Beth Phi MD on 05/13/2010   Method used:    Print then Give to Patient   RxID:   [id number] AMINOFEN 500 MG TABS (ACETAMINOPHEN) one tab every 6 hrs  #90 x 3   Entered and Authorized by:   Beth Phi MD   Signed by:   Beth Phi MD on 05/13/2010   Method used:   Electronically to        [facility] [address] [id number]* (retail)       [address] [place], [place]  [address]       Ph: [phone]       Fax: [phone]   RxID:   [id number] LEVOTHYROXINE SODIUM 50 MCG TABS (LEVOTHYROXINE SODIUM) 1 by mouth once daily for thyroid.  #30 x 3  Entered and Authorized by:   Beth Phi MD   Signed by:   Beth Phi MD on 05/13/2010   Method used:   Electronically to        [id number]* (retail)       [address] [place], [place]  [address]       Ph: [phone]       Fax: [phone]   RxID:   [id number] RANITIDINE HCL 150 MG TABS (RANITIDINE HCL) 1 tab by mouth two times a day as needed for GERD  #60 x 0   Entered and Authorized by:   Beth Phi MD   Signed by:   Beth Phi MD on 05/13/2010   Method used:   Electronically to        [id number]* (retail)       [address] [place], [place]  [address]       Ph: [phone]       Fax: [phone]   RxID:   [id number] ULTRAM 50 MG TABS (TRAMADOL HCL) take 1-2 tabs by mouth at night for pain  #30 x 1   Entered and Authorized by:   Beth Phi MD   Signed by:   Beth Phi MD on 05/13/2010   Method used:   Electronically to        [facility]* (retail)       [address] [place], [place]  [address]       Ph: [phone]       Fax: [phone]   RxID:   [id number] LISINOPRIL 40 MG TABS (LISINOPRIL) 1 by mouth once daily  #30 x 1   Entered and Authorized by:   Beth Phi MD   Signed by:   Beth Phi MD on 05/13/2010   Method used:   Electronically to        [id number]* (retail)       [address] [place], [place]  [address]       Ph: [phone]       Fax: [phone]   RxID:   [id number] SIMVASTATIN 40 MG TABS (SIMVASTATIN) 1 tab by mouth daily for cholesterol  #30 x 1   Entered and Authorized by:   Beth Phi MD   Signed by:   Beth Phi MD on 05/13/2010   Method used:   Electronically to        [facility] #[id number]* (retail)       [address] [place], [place]  [address]       Ph: [phone]       Fax: [phone]   RxID:   [id number] HYDROCHLOROTHIAZIDE 25 MG  TABS (HYDROCHLOROTHIAZIDE) Take 1 tab by mouth every morning  #30 x 11   Entered and Authorized by:   Beth Phi MD   Signed by:   Beth Phi MD on 05/13/2010   Method used:   Electronically to        [facility]* (retail)       [address] [place], [place]  [address]       Ph: [phone]       Fax: [phone]   RxID:   [id number] ECOTRIN LOW  STRENGTH 81 MG TBEC (ASPIRIN) Take 1 tablet by mouth every morning  #90 x 3   Entered and Authorized by:   Beth Phi MD   Signed by:   Beth Phi MD on 05/13/2010   Method used:   Electronically to        [facility]* (retail)       [address] [place], [place]  [address]       Ph: [phone]       Fax: [phone]   RxID:   [id number] AMINOFEN 500 MG TABS (ACETAMINOPHEN) one tab every 6 hrs  #90 x 3   Entered and Authorized by:   Beth Phi MD   Signed by:   Beth Phi MD on 05/13/2010   Method used:   Print then Give to Patient   RxID:   [id number] ZOSTAVAX 19400 UNT/0.65ML SOLR (ZOSTER VACCINE LIVE) one injection IM  #1 x 0   Entered and Authorized by:   Beth Phi MD   Signed by:   Beth Phi MD on 05/13/2010   Method used:   Print then Give to Patient   RxID:   [id number]    Orders Added: 1)  Mammogram (Screening) [Mammo] 2)  Pneumococcal Vaccine [90732] 3)  Admin 1st Vaccine [90471] 4)  [facility]- Est  Level 4  [[address]] 5)  Lipid-[facility] [[address]61-[address]30] 6)  Comp Met-[facility] [29518-84166] 7)  TSH-[facility] [[address]-60109]   Immunizations Administered:  Pneumonia Vaccine:    Vaccine Type: Pneumovax    Site: right deltoid    Mfr: Merck    Dose: 0.5 ml    Route: IM    Given by: Loralee Pacas CMA    Exp. Date: 10/08/2011    Lot #: [id number]    VIS given: 04/20/09 version given May 13, 2010.   Immunizations Administered:  Pneumonia Vaccine:    Vaccine Type: Pneumovax    Site: right deltoid    Mfr: Merck    Dose: 0.5 ml    Route: IM    Given by: Loralee Pacas CMA    Exp. Date: 10/08/2011    Lot #: [id number]    VIS given: 04/20/09 version given May 13, 2010.   Prevention & Chronic Care Immunizations   Influenza vaccine: Fluvax 3+  (03/04/2009)   Influenza vaccine due: 02/07/2009    Tetanus booster: 03/04/2009: Td   Tetanus booster due: 03/16/2009    Pneumococcal vaccine: Pneumovax  (05/13/2010)   Pneumococcal vaccine due: None    H. zoster vaccine: Not documented  Colorectal Screening   Hemoccult: colonoscopy  (02/14/2008)   Hemoccult due: 02/13/2009    Colonoscopy: normal  (04/03/2007)   Colonoscopy due: 04/02/2017  Other Screening   Pap smear: normal  (02/08/2008)   Pap smear due: 02/07/2009    Mammogram: ASSESSMENT: Negative - BI-RADS 1^MM DIGITAL SCREENING  (03/31/2009)   Mammogram due: 03/20/2009    DXA bone density scan: Done.  (08/19/2004)   DXA scan due: None    Smoking status: never  (12/07/2009)  Lipids   Total Cholesterol: 229  (03/04/2009)   LDL: 135  (03/04/2009)   LDL Direct: 157  (09/12/2007)   HDL: 72  (03/04/2009)   Triglycerides: 112  (03/04/2009)    SGOT (AST): 19  (12/07/2009)   SGPT (ALT): 12  (12/07/2009) CMP ordered    Alkaline phosphatase: 42  (12/07/2009)   Total bilirubin: 0.7  (12/07/2009)  Hypertension   Last Blood Pressure: 147 / 80  (05/13/2010)  Serum creatinine: 1.30  (12/07/2009)   Serum potassium 3.9  (12/07/2009) CMP ordered    Self-Management Support :    Hypertension self-management support: Not documented    Lipid self-management support: Not documented

## 2010-06-17 NOTE — Letter (Signed)
Summary: Generic Letter  [facility]  [address]   [place], [place] [address]   Phone: [phone]  Fax: [phone]    06/10/2010  [address] [place], [place]  [address]  Dear Ms. Roskelley,  We are happy to let you know that since you are covered under Medicare you are able to have a FREE visit at the [facility] to discuss your HEALTH. This is a new benefit for Medicare.  There will be no co-payment.  At this visit you will meet with Arlys John an expert in wellness and the health coach at our clinic.  At this visit we will discuss ways to keep you healthy and feeling well.  This visit will not replace your regular doctor visit and we cannot refill medications.     You will need to plan to be here at least one hour to talk about your medical history, your current status, review all of your medications, and discuss your future plans for your health.  This information will be entered into your record for your doctor to have and review.  If you are interested in staying healthy, this type of visit can help.  Please call the office at: [phone], to schedule a "Medicare Wellness Visit".  The day of the visit you should bring in all of your medications, including any vitamins, herbs, over the counter products you take.  Make a list of all the other doctors that you see, so we know who they are. If you have any other health documents please bring them.  We look forward to helping you stay healthy.  Sincerely,   [facility]  iAWV

## 2010-07-08 ENCOUNTER — Telehealth: Payer: Self-pay | Admitting: Family Medicine

## 2010-07-08 NOTE — Telephone Encounter (Signed)
Pt would like to know what she can take otc for a cold

## 2010-07-08 NOTE — Telephone Encounter (Signed)
Spoke with patient and the symptoms she has are chest congestion  with some green sputum. occasional cough. Hoarseness.  States she has no fever. Consulted with Dr.Breen .  Advised she may take Mucinex OTC.  Tylenol for discomfort. If symptoms not improving or worsening or fever advised to call back.

## 2010-07-19 ENCOUNTER — Ambulatory Visit (INDEPENDENT_AMBULATORY_CARE_PROVIDER_SITE_OTHER): Payer: BC Managed Care – PPO | Admitting: Family Medicine

## 2010-07-19 ENCOUNTER — Encounter: Payer: Self-pay | Admitting: Family Medicine

## 2010-07-19 DIAGNOSIS — H6123 Impacted cerumen, bilateral: Secondary | ICD-10-CM

## 2010-07-19 DIAGNOSIS — I10 Essential (primary) hypertension: Secondary | ICD-10-CM

## 2010-07-19 DIAGNOSIS — D649 Anemia, unspecified: Secondary | ICD-10-CM

## 2010-07-19 DIAGNOSIS — E039 Hypothyroidism, unspecified: Secondary | ICD-10-CM

## 2010-07-19 DIAGNOSIS — H6120 Impacted cerumen, unspecified ear: Secondary | ICD-10-CM

## 2010-07-19 DIAGNOSIS — E785 Hyperlipidemia, unspecified: Secondary | ICD-10-CM

## 2010-07-19 NOTE — Assessment & Plan Note (Signed)
Pt is due for Lipid panel.  Taking zocor.  Has improved diet/exercise so expect improvement

## 2010-07-19 NOTE — Patient Instructions (Signed)
It was very nice to meet you today Please make a morning lab appt for your blood work No food or drink except your coffee after midnight. Thanks for coming in today!

## 2010-07-19 NOTE — Assessment & Plan Note (Signed)
Pt taking medication, due for recheck of TSH, so will order for future labs

## 2010-07-19 NOTE — Progress Notes (Signed)
  Subjective:    Patient ID: Beth Harper, female    DOB: 02/10/1941, 70 y.o.   MRN: [id number]  HPI  HTN/HL- does not check at home.  Has been taking cough/cold medications for cold x 1 week.  No HA, palpiations, CP Avoiding fried foods and salt.  Walking several times per week without leg pain or CP.    Thyroid- not sure why she si taking medication, does not feel any fatigue, has had some weight loss, denies constipation, diarrhea.  Review of Systems    see above Objective:   Physical Exam    Vital signs reviewed General appearance - alert, well appearing, and in no distress and oriented to person, place, and time Heart - normal rate, regular rhythm, normal S1, S2, no murmurs, rubs, clicks or gallops Eyes - pupils equal and reactive, extraocular eye movements intact, sclera anicteric Ears - bilateral TM's and external ear canals normal, however both ears with significant wax buildup Nose - normal and patent, no erythema, discharge or polyps Chest - clear to auscultation, no wheezes, rales or rhonchi, symmetric air entry, no tachypnea, retractions or cyanosis     Assessment & Plan:

## 2010-07-19 NOTE — Assessment & Plan Note (Signed)
Taking meds as perscribed.  Elevated today BP: 148/88 mmHg  Will continue current regime, check CMET at future labs

## 2010-07-20 ENCOUNTER — Other Ambulatory Visit: Payer: Medicare Other

## 2010-07-20 DIAGNOSIS — E785 Hyperlipidemia, unspecified: Secondary | ICD-10-CM

## 2010-07-20 DIAGNOSIS — I10 Essential (primary) hypertension: Secondary | ICD-10-CM

## 2010-07-20 DIAGNOSIS — E039 Hypothyroidism, unspecified: Secondary | ICD-10-CM

## 2010-07-20 DIAGNOSIS — D649 Anemia, unspecified: Secondary | ICD-10-CM

## 2010-07-20 LAB — CONVERTED CEMR LAB
AST: 15 units/L (ref 0–37)
Alkaline Phosphatase: 41 units/L (ref 39–117)
BUN: 24 mg/dL — ABNORMAL HIGH (ref 6–23)
Creatinine, Ser: 1.14 mg/dL (ref 0.40–1.20)
HCT: 31.9 % — ABNORMAL LOW (ref 36.0–46.0)
HDL: 64 mg/dL (ref >39)
Hemoglobin: 10.3 g/dL — ABNORMAL LOW (ref 12.0–15.0)
LDL Cholesterol: 131 mg/dL — ABNORMAL HIGH (ref 0–99)
MCHC: 32.3 g/dL (ref 30.0–36.0)
Potassium: 4.4 meq/L (ref 3.5–5.3)
RDW: 16.2 % — ABNORMAL HIGH (ref 11.5–15.5)
TSH: 23.259 microintl units/mL — ABNORMAL HIGH (ref 0.350–4.500)
Total Bilirubin: 0.4 mg/dL (ref 0.3–1.2)
Total CHOL/HDL Ratio: 3.3
VLDL: 18 mg/dL (ref 0–40)

## 2010-07-20 LAB — LIPID PANEL
HDL: 64 mg/dL (ref >39)
LDL Cholesterol: 131 mg/dL — ABNORMAL HIGH (ref 0–99)
Total CHOL/HDL Ratio: 3.3 Ratio
Triglycerides: 90 mg/dL (ref <150)
VLDL: 18 mg/dL (ref 0–40)

## 2010-07-20 LAB — COMPREHENSIVE METABOLIC PANEL
AST: 15 U/L (ref 0–37)
Albumin: 4.1 g/dL (ref 3.5–5.2)
Alkaline Phosphatase: 41 U/L (ref 39–117)
BUN: 24 mg/dL — ABNORMAL HIGH (ref 6–23)
Potassium: 4.4 mEq/L (ref 3.5–5.3)

## 2010-07-20 LAB — CBC
HCT: 31.9 % — ABNORMAL LOW (ref 36.0–46.0)
MCHC: 32.3 g/dL (ref 30.0–36.0)
RDW: 16.2 % — ABNORMAL HIGH (ref 11.5–15.5)

## 2010-07-22 ENCOUNTER — Telehealth: Payer: Self-pay | Admitting: Family Medicine

## 2010-07-22 MED ORDER — LEVOTHYROXINE SODIUM 100 MCG PO TABS: 100.0000 ug | ORAL_TABLET | Freq: Every day | ORAL | Status: DC

## 2010-07-22 NOTE — Telephone Encounter (Signed)
All three numbers do not reach pt.  Will send letter.  Pt should increase synthroid to 100 and recheck TSH in 6 weeks.
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
## 2010-10-01 NOTE — Discharge Summary (Signed)
. [facility]  Patient:    Beth Harper, Beth Harper                       MRN: [id number] Adm. Date:  [id number] Disc. Date: [id number] Attending:  Abigail Miyamoto A                           Discharge Summary  HISTORY OF PRESENT ILLNESS:  Beth Harper is a 70 year old female who presented with two to three months of intermittent abdominal pain with worsening pain over the last three days.  She complained of fullness and inability to have bowel movements.  PAST MEDICAL HISTORY:  Significant for hospitalization for diverticulitis with an ileus approximately one year prior to this admission.  HOSPITAL COURSE:  She was admitted to the medical teaching service and x-rays showed possibility of a small bowel obstruction versus ileus.  At this point the patient was admitted and made NPO with NG suctioning and IV hydration. Surgical consultation followed this.  At this point it became obvious that the obstruction was in the patients colon and could be possibly secondary to diverticular disease versus cancer.  CAT scan of the abdomen and pelvis showed the area of concern, but again could not rule out cancer.  Because of her persistent distention and inability to perform bowel prep, the decision was made after several days of unsuccessful bowel rest with NG suctioning to take the patient to the operating room.  During the hospitalization, the patient was followed by the teaching service for her renal standpoint as well as her cardiovascular standpoint.  The patient was taken to the operating room on May 17, where she underwent a left hemicolectomy with colostomy and Hartmans pouch as well as takedown of the splenic flexure.  She also underwent an incidental appendectomy.  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  Postoperatively, the patient was inoculated with the appropriate vaccinations after the splenectomy.  Postoperatively  she was taken back to the regular surgical floor.  On postoperative day #1, her hemoglobin was 10.2 with a potassium of 6.4.  This was felt to be secondary to volume depletion and her IV potassium was changed.  Over the next several days, she slowly improved and her NG tube was removed on postoperative day #3. Her hemoglobin still continued to slowly drift and was 8.9 by postoperative day #3.  Final pathology on the colon specimen was available by postoperative day #4 and revealed diverticulitis with extensive fibrosis, but no carcinoma identified.  She began tolerating liquids on postoperative day #4 and her diet was slowly advanced.  Her hemoglobin, however, continued to drift and by postoperative day #5 was 7.9, so the patient was transfused two units of packed red blood cells.  Again the anemia was thought to be secondary to blood loss from surgery.  Her potassium became lower and was hypokalemic at 3.1, therefore, potassium was replaced as well.  After this, the patient began to slowly improve.  She did have some drainage from her lower incision which was opened and minimal purulence was identified.  Wound care was thus begun.  She began ambulating well and began having bowel movements after this.  During the hospitalization, the ostomy wound care nurses followed Ms. Pal well and she began to be quite adequate with care of her colostomy.  By postoperative day #10 her incision continued to remain clean and the decision  was made on postoperative day #10 to discharge Ms. Pal to home.  DISCHARGE DIAGNOSIS:  Diverticulitis with stricture and colon obstruction. She is status post left hemicolectomy with colostomy and Hartmans pouch, takedown of splenic flexure with splenectomy and appendectomy.  DIET:  Regular.  ACTIVITY:  She is to do no heavy lifting.  She will be undergoing wet to dry dressing changes to her abdominal incision b.i.d. as well as ostomy training by Home Health  Nursing.  DISCHARGE MEDICATIONS:  She will take Vicodin for pain.  She will resume her home medications.  FOLLOW-UP:  Being arranged with her medical follow-up postoperative as well. She will follow up with my office in one week postdischarge. DD:  11/01/00 TD:  11/01/00 Job: [id number]

## 2010-10-01 NOTE — Op Note (Signed)
[facility]  Patient:    Beth Harper Visit Number: [id number] MRN: [id number]          Service Type: SUR Location: 4W 0452 01 Attending Physician:  Shelly Rubenstein Proc. Date: 01/11/01 Admit Date:  01/11/2001                             Operative Report  PREOPERATIVE DIAGNOSIS:  Colostomy.  POSTOPERATIVE DIAGNOSIS:  Colostomy.  PROCEDURES: 1. Laparoscopic converted to open colostomy take-down and colon reanastomosis. 2. Small bowel resection. 3. Repair of enterotomy.  SURGEON:  Douglas A. Magnus Ivan, M.D.  ASSISTANT:  Catalina Lunger, M.D.  ANESTHESIA: General endotracheal.  INDICATIONS:  Beth Harper is a 70 year old female, who had a complete large bowel obstruction from a diverticular stricture.  She underwent a resection with a colostomy and now is here for colostomy take-down.  FINDINGS:  The patient was found on laparoscopy to have multiple dense adhesions of the small bowel to the abdominal wall.  During the dissection, two separate enterotomies were made.  Once necessitated a small bowel resection.  PROCEDURE IN DETAIL:  The patient was brought to the operating room and identified as Beth Harper.  She was placed supine on the operating table, and general anesthesia was induced.  Next, the patient was placed in the lithotomy position.  Her stoma was then closed with a running interlocked silk suture.  Her abdomen was then prepped and draped in the usual sterile fashion. Using a #15 blade, a small transverse incision was made in the patients right flank.  The incision was carried down through the fascia with electrocautery. The peritoneum was then opened.  The Hasson port was then placed through the opening after an 0 Vicryl pursestring suture was placed around this, and insufflation of the abdomen was begun.  Upon insertion of the camera, the patient was found to have multiple thick dense adhesions with the small  bowel tethered to the abdominal wall in multiple places.  Given this, the decision was made to convert to an open procedure.  The Hasson port was thus removed. The midline incision was then opened with a #10 scalpel, removing the scar. The midline was then opened down to the fascia with the electrocautery.  The fascial peritoneum was then opened.  Upon entering the abdomen, a small enterotomy was created and was repaired with interrupted 3-0 silk sutures. The patient was indeed found to have dense adhesions of the small bowel throughout the abdomen, especially in the left upper quadrant where a previous splenectomy had been performed.  During take-down of these adhesions, again, another large enterotomy was created.  Further mobilization finally revealed the area of the transverse colostomy.  The rectal stump was also identified, having had a Prolene suture placed in it at the previous laparotomy.  The rectal stump was mobilized circumferentially with the electrocautery.  Once all of the small bowel adhesions were taken down, an elliptical incision was made on the colostomy at the site of the skin, and this was mobilized further down to the fascia.  The colostomy was then placed back into the abdominal cavity.  All further adhesions were taken down with both the cautery and Metzenbaum scissors.  Next a side-to-side small bowel anastomosis was created after resecting the area of the enterotomy with GIA 55 stapler.  The anastomosis was performed in a side-to-side fashion with the GIA 55 as well as the TA 30 stapler.  The mesenteric defect was closed with silk sutures as well.  Next the proximal end at the colostomy was transected with the electrocautery.  A Prolene pursestring suture was then placed around this. The anvil from a size 31 EEA staple was then brought onto the field and placed into the proximal colon.  The Prolene pursestring suture was then tied in place around the anvil.  The  proximal end of the staple was then placed through the anus and then manipulated up the rectal stump.  An end-to-side colorectal anastomosis was then performed with the EEA stapler, bringing the two ends of stapler together.  A large anastomosis appeared to be created. The donuts were examined and found to be intact.  Next, proctoscopy was performed in the area and with visualization, the anastomosis appeared to be intact.  The abdomen was then copiously irrigated with normal saline.  The fascia at the ostomy site as well as the camera port site were closed with interrupted and figure-of-eight #1 Novofil sutures in two layers.  The midline fascia was then closed with a running #1 Prolene suture.  The final incision was then irrigated with saline and then closed with skin staples.  The patient tolerated the procedure well.  All sponge, needle, and instrument counts were correct at the end of the procedure.  The patient was then extubated in the operating room and taken in stable condition to the recovery room. Attending Physician:  Shelly Rubenstein DD:  01/11/01 TD:  01/11/01 Job: [id number]

## 2010-10-01 NOTE — Discharge Summary (Signed)
[place]  Patient:    EULETA, BELSON Visit Number: [id number] MRN: [id number]          Service Type: SUR Location: 4W 2952 01 Attending Physician:  Shelly Rubenstein Dictated by:   Abigail Miyamoto, M.D. Admit Date:  01/11/2001 Discharge Date: 01/18/2001                             Discharge Summary  HISTORY OF PRESENT ILLNESS:  The patient is a very pleasant, 70 year old female who underwent a colon resection and colostomy for a obstructing diverticular disease.  She is now presenting for a colostomy takedown.  HOSPITAL COURSE:  The patient was admitted and taken to the operating room on January 11, 2001, where she underwent a laparoscopic converted to open colostomy takedown with a small bowel resection as well as her _______________.  She tolerated the procedure well and was taken in stable condition to a regular surgical floor.  On postoperative day #1, she was doing well.  At this time, her white blood count was 4.2 with a hemoglobin of 9.9 and potassium of 4.4.  She was maintained on an n.p.o. status until postoperative day #2 where she was started on sips.  On postoperative day #3, she continued to do well, and her diet was slowly advanced to liquids.  She continued to do well with this and was slowly advanced along.  By postoperative day #5, she was tolerating a full liquid diet and was advanced to a regular diet.  By postoperative day #6, she was having flatus and was ambulating well.  Her ileus was resolving.  Her incisions were all healing well.  By postoperative day #7, she was having bowel movements, was tolerating a regular diet, and oral pain medications.  Therefore, the decision was made to discharge the patient to home.  DISCHARGE DIAGNOSES: 1. Colostomy status post colostomy takedown. 2. Colon reanastomosis. 3. Small bowel resection.  DISCHARGE DIET:  Regular.  DISCHARGE ACTIVITY:  She is to do no heavy  lifting for six weeks.  She may shower.  DISCHARGE MEDICATIONS:  She will resume her home medications.  She will take Vicodin, Advil, and Tylenol for pain.  She will follow up in my office in one week post discharge. Dictated by:   Abigail Miyamoto, M.D. Attending Physician:  Shelly Rubenstein DD:  02/05/01 TD:  02/05/01 Job: [id number]

## 2010-10-01 NOTE — Discharge Summary (Signed)
[place]  Patient:    Beth Harper, Beth Harper                       MRN: [id number] Adm. Date:  [id number] Disc. Date: [id number] Attending:  Willow Ora Dictator:   Lyndee Leo. Janey Greaser, M.D.                           Discharge Summary  PRIMARY M.D.:  Dr. Rodman Key.  HISTORY OF PRESENT ILLNESS:  The history and physical have been dictated. Briefly, the patient is a 70 year old black female who was admitted on July 07, 1999, with a chief complaint of abdominal pain, and was found to have diverticulitis ith resulting ileus.  The patient was started on Flagyl, Ultram, and Cipro at admission.  HOSPITAL COURSE:  The patients initial laboratories revealed a basic metabolic panel within normal limits.  AST and ALT were within normal limits.  Lipase was 27. H&H was 14.5 and 42.8.  Chest x-ray was negative, except for cardiomegaly. Abdominal series was performed and revealed partial small-bowel obstruction. The patient was started n.p.o. and bowel rest, put on antibiotics, and given Dulcolax. The patient slowly was titrated up on p.o., started on clear liquids, and slowly advanced from there.  The patient was seen by mobility team.  Two days after admission, the patient had increasing abdominal pain and an episode of nausea. She was then placed back on n.p.o., and continued with antibiotics, however, changed to IV.  On July 10, 1999, GI was consulted, and felt a CT was in order. CT revealed extensive ______ diverticulitis.  At this point, surgery was consulted to further assess her small-bowel obstruction and diverticulitis.  The patient was  also started on an NG tube to help with her drainage.  Surgery saw the patient n July 11, 1999, and felt like this was an ileus secondary to diverticulitis.  Had repeat abdominal series, which saw contrast from her previous CT now in the  colon, so ruled out for complete bowel obstruction.  In light  of this, felt like continuing with n.p.o. and bowel rest and slowly need to slowly titrate her back up with her p.o.  GI was very doubtful that any kind of cancer of the colon was going on, given that the patient had a prior colonoscopy done in 1997, which was negative.  On July 13, 1999, the patient started having flatus, and was started back on clear liquids and did well with this.  The plan at this time was if the  patient started having more problems to repeat a CT.  She was advanced to a soft diet and then, before full diet was instituted, the patient started throwing up and having increasing abdominal pain, which occurred on July 15, 1999.  A repeat CT was performed at this time.  The CT revealed thickening and mild inflammatory changes of the proximal sigmoid colon.  At this point, the patient was still assessed with having sigmoid diverticulitis.  However, since she has had three previous hospitalizations for diverticular bleed and was having considerable problems with ileus secondary to diverticulitis, surgery felt that an elective partial colectomy done on an outpatient basis would benefit her.  GI also was following, and agreed. On July 18, 1999, the patient improved considerably and continued advancing diet. On July 19, 1999, the patient was discharged on a full diet.  Planned for the patient to follow up  with her primary doctor, Dr. Marina Goodell, next week on July 28, 1999, at 11:20.  The patient also is to call and make an appointment with Dr. Magnus Ivan in three weeks to be seen in the office and have scheduled for her  partial colectomy for diverticulitis.  DISCHARGE DIAGNOSIS:  Diverticulitis with secondary ileus.  PLAN:  The patient will be continued on antibiotics of Cipro and Flagyl for seven days.  Will be continued on her docusate sodium.  Was given Ultram p.r.n. for pain 50 mg 1 p.o. q.4-6h. p.r.n., and restart on her hydrochlorothiazide.  DISCHARGE  MEDICATIONS: 1. Hydrochlorothiazide 12.5 mg p.o. q.d. 2. Cipro 500 mg p.o. b.i.d. x 7 days. 3. Flagyl 500 mg b.i.d. x 7 days. 4. Docusate sodium. 5. Ultram 50 mg 1 p.o. q.4-6h. p.r.n. DD:  07/19/99 TD:  07/20/99 Job: [id number]

## 2010-10-01 NOTE — H&P (Signed)
[place]. [facility]  Patient:    Beth Harper, Beth Harper                       MRN: [id number] Attending:  Asencion Partridge, M.D. Dictator:   Carey Bullocks, M.D. CC:         Cheree Ditto, M.D., [facility]                         History and Physical  CHIEF COMPLAINT:  Abdominal pain.  HISTORY OF PRESENT ILLNESS:  [age]-year-old African-American female who complains of a one-week history of midline upper abdominal pain.  Seen at the [facility] on June 28, 1999, diagnosed with diverticulitis and started on a 10-day course of Flagyl and Cipro, with Ultram p.r.n. for pain. She was seen again yesterday, as her pain was no better, and she continued to have vomiting off and on since the 12th, usually after p.o. intake.  She reports not  being able to keep down many of the doses of her antibiotic.  Pain is described as a recurrent gurgling throughout the upper abdomen, worse midline, with in between episodes of sharp stabbing pain and otherwise a residual soreness.  Pain is at worst a 10/10 and is usually a 2-3/10 at best.  She has been having decreased p.o. intake, vomiting twice today and once this morning.  Her last bowel movement was June 28, 1999, usually having daily bowel movements.  She is also feeling bloated and reports having tactile fever, chills and night sweats.  PROBLEM LIST:  Anemia, tobacco abuse, hypertension, diverticulosis, DVT in 1995 and three hospitalizations for diverticular bleeds in 1997, 1998 and 2000.  She has not had prior abdominal surgery.  MEDICATIONS: 1. Centrum 1 p.o. q.d. 2. Hydrochlorothiazide 12.5 mg p.o. q.d. 3. Flagyl 500 mg 1 p.o. b.i.d. x 10 days (started June 28, 1999). 4. Cipro 500 mg 1 p.o. b.i.d. x 10 days, started June 28, 1999. 5. Ultram 50 mg 1 to 2 p.o. q.4h. p.r.n. pain. 6. Docusate sodium p.r.n.  ALLERGIES:  No known drug allergies.  SURGERIES:  None.  SOCIAL  HISTORY:  Patient is unmarried and lives with her grandson.  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  FAMILY HISTORY:  Her mother also had diverticulosis, hypertension and thyroid disease; her father, there is some question as to whether he had an aneurysm.  REVIEW OF SYSTEMS:  Positive fever, chills and night sweats.  Positive weight loss of 12 pounds in the last two to four weeks.  No chest pain, shortness of breath or diaphoresis, though she is having palpitations with bouts of pain.  She denies kin rash.  No dysuria, though she had one episode of urinary incontinence during her emesis this morning (never before).  OBJECTIVE:  VITAL SIGNS:  Temperature 98.2, pulse 130, respirations 20, blood pressure 108/62. O2 saturation 98% on room air.  GENERAL:  No acute distress, with bouts of severe pain in between, speaking with a quiet voice in complete sentences, alert and oriented x 3.  HEENT:  TMs clear bilaterally.  No conjunctival injection.  PERRL.  Pharynx clear with upper and lower dentures.  No lymphadenopathy.  No thyromegaly.  NECK:  Supple.  LUNGS:  CTA bilaterally with good air exchange and effort.  CARDIOVASCULAR:  RRR.  Tachycardic in the 110 to  120s.  No murmur.  ABDOMEN:  Absent bowel sounds.  Moderately distended.  Minimal tenderness to palpation in the lower quadrants with moderate-to-severe tenderness in the upper bilateral quadrants, greater in the midline.  EXTREMITIES:  No clubbing, cyanosis, or edema.  LABORATORY AND X-RAY FINDINGS:  White blood cell count 6.0, hemoglobin 14.5. BUN 30, creatinine 1.4, lipase 27, total bilirubin 1.0, AST 27, ALT 12.  Chest x-ray:  Mild cardiomegaly.  Abdominal series demonstrates a partial small-bowel obstruction.  ASSESSMENT AND PLAN: 1. Partial small-bowel obstruction.  Admit for intravenous fluids, intravenous ain     medications and bowel rest.  Etiology is unclear, given patient has no prior    abdominal surgeries, though partially treated diverticulitis is a possibility.    As such, will start the patient on intravenous Zosyn for gram-negative anaerobic    and aerobic coverage and follow her course clinically, repeating a KUB in the    morning. 2. Hypertension.  Blood pressure is well-controlled and will hold    hydrochlorothiazide for now, given patient being n.p.o. 3. Dehydration:  Mild-to-moderately dehydrated, confirmed with BUN-to-creatinine    ratio.  One liter intravenous bolus and then maintenance fluids after that. DD:  07/07/99 TD:  07/07/99 Job: [id number]

## 2010-10-01 NOTE — Op Note (Signed)
[place]  Patient:    Beth Harper, Beth Harper                       MRN: [id number] Proc. Date: 09/29/00 Adm. Date:  [id number] Attending:  Doneta Public                           Operative Report  PREOPERATIVE DIAGNOSIS:  Large bowel obstruction.  POSTOPERATIVE DIAGNOSIS:  Large bowel obstruction.  PROCEDURES: 1. Left hemicolectomy. 2. Colostomy and Hartmanns pouch. 3. Takedown of splenic flexure. 4. Splenectomy. 5. Appendectomy.  SURGEON:  Abigail Miyamoto, M.D.  ASSISTANT:  Donnie Coffin. Samuella Cota, M.D.  ANESTHESIA:  General endotracheal anesthesia.  INDICATIONS:  Beth Harper is a 70 year old female who presented with a large bowel obstruction.  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  CT scan of the abdomen failed to show any evidence of carcinoma.  FINDINGS:  The patient was indeed found to have stricturing in the left colon just distal to the splenic flexure.  Gross evaluation by pathology revealed no evidence of carcinoma.  The spleen was incidentally injured during takedown of the splenic flexure and had to be removed secondary to the inability to achieve hemostasis.  An incidental appendectomy was performed.  DESCRIPTION OF PROCEDURE:  The patient was brought to the operating room, identified as Beth Harper.  She was placed supine on the operating room table.  Then general anesthesia was induced.  Her abdomen was then prepped and draped in the usual sterile fashion.  Using a #10 blade, a midline incision was then created.  The incision was carried down through the fascia with the electrocautery.  Fascia and peritoneum were then entered the entire length of the incision.  Upon entering the abdomen, the patient was found to have multiple loops of dilated small bowel.  Her ascending and transverse colon were moderately dilated, and the sigmoid colon and  descending colon were collapsed.  The area of stricture could be identified as a firm area.  Next, the left colon was mobilized along the white line of Toldt toward the rectum as well as toward the splenic flexure.  Takedown of the splenic flexure was then performed by taking down the adhesions with Kelly clamps as well as the electrocautery.  During this, a tear in the splenic capsule in the base of the spleen occurred.  This was packed.  The colon was continued to be mobilized around the splenic flexure toward the distal transverse colon.  The omentum was taken off of this as well.  Prior to starting this, an incidental appendectomy was performed.  The base of the appendix was tied off with two simple 2-0 Vicryl sutures.  After the colon was mobilized, the distal end was transected with a GIA-75 stapler at the rectum.  The mesentery was then scored with electrocautery and then taken down with Kelly clamps and 2-0 silk ties. An area of mid-transverse colon was also identified, transected with the GIA-75 stapler as well.  The entire specimen was then removed from the field and sent to pathology for identification.  Again, gross analysis revealed no evidence of carcinoma.  Once the colon was completely removed, the splenic bed was again examined.  Moderate bleeding was occurring at the base of the spleen.  This was packed with Surgicel and gauze.  Pressure was applied. Despite this, hemostasis  could still not be achieved and therefore the decision made to proceed with splenectomy.  The posterior attachments to the spleen were taken down with electrocautery.  The splenic vessels were then taken down with Kelly clamps and multiple 2-0 silk ties.  Again hemostasis here appeared to be achieved.  The proximal colon was mobilized slightly further for the colostomy.  Next, a lateral incision was made for the colostomy with the scalpel.  The incision was carried down to the rectus fascia, which was  then opened in a cruciate manner.  The muscle fibers were then split, and the peritoneum was opened.  The proximal colon was pulled out of this separate incision as the colostomy.  The abdomen was then copiously irrigated with normal saline.  Hemostasis appeared to be achieved.  The midline fascia was then closed with a running #1 PDS suture.  Skin was then irrigated and closed with skin staples.  Next, the end of colon coming out of the colostomy site was opened up, removing the staple line with the electrocautery.  The colostomy was then matured circumferentially with interrupted 3-0 Vicryl sutures.  An ostomy appliance was then applied.  The patient tolerated the procedure well.  All sponge, needle, and instrument counts were correct at the end of the procedure.  The patient then extubated in the operating room and taken in stable condition to the recovery room. DD:  09/29/00 TD:  10/02/00 Job: [id number]

## 2010-12-20 ENCOUNTER — Ambulatory Visit: Payer: Medicare Other | Admitting: Family Medicine

## 2010-12-21 ENCOUNTER — Encounter: Payer: Self-pay | Admitting: Family Medicine

## 2010-12-21 ENCOUNTER — Ambulatory Visit (INDEPENDENT_AMBULATORY_CARE_PROVIDER_SITE_OTHER): Payer: BC Managed Care – PPO | Admitting: Family Medicine

## 2010-12-21 ENCOUNTER — Ambulatory Visit: Payer: Medicare Other | Admitting: Family Medicine

## 2010-12-21 DIAGNOSIS — E039 Hypothyroidism, unspecified: Secondary | ICD-10-CM

## 2010-12-21 DIAGNOSIS — I10 Essential (primary) hypertension: Secondary | ICD-10-CM

## 2010-12-21 DIAGNOSIS — K219 Gastro-esophageal reflux disease without esophagitis: Secondary | ICD-10-CM

## 2010-12-21 NOTE — Patient Instructions (Signed)
Very nice to meet you I want you to continue your blood pressure medicine it is working You can stop your zantac and your ultram.  You should come back and see Dr. Armen Pickup or myself in 3 months

## 2010-12-21 NOTE — Assessment & Plan Note (Signed)
meds are controlling blood pressure, no need for labs, follow up in 3 months.

## 2010-12-21 NOTE — Progress Notes (Signed)
  Subjective:    Patient ID: Beth Harper, female    DOB: January 24, 1941, 70 y.o.   MRN: [id number]  HPI 1. Hypertension Blood pressure at home:not checking regularly.  Blood pressure today: 126/73 on recheck.  Taking Meds:yes Side effects:has had very mild headache if she misses meds but otherwise nothing.  ROS: Denies headache visual changes nausea, vomiting, chest pain or abdominal pain or shortness of breath.  Pt would like to cut down on medicine she is taking.  Understands she cannot take of the blood pressure medicine but wanted to know if she could stop other medications.  Gerd-  Pt states has not had heartburn for a very long time and has not notice any pain.     Thyroid-  Pt on synthroid, no hair loss good energy, no constipation, happy with  Dose, does not have time to have blood drawn today.   Review of Systems Denies fever, chills, nausea vomiting abdominal pain, dysuria, chest pain, shortness of breath dyspnea on exertion or numbness in extremities Past medical history, social, surgical and family history all reviewed.      Objective:   Physical Exam BP 185/72  Pulse 75  Temp(Src) 98 F (36.7 C) (Oral)  Wt 135 lb (61.236 kg) General appearance: alert and cooperative Throat: lips, mucosa, and tongue normal; teeth and gums normal Neck: no adenopathy, no JVD, supple, symmetrical, trachea midline and thyroid not enlarged, symmetric, no tenderness/mass/nodules Lungs: clear to auscultation bilaterally Heart: regular rate and rhythm, S1, S2 normal, no murmur, click, rub or gallop Abdomen: soft, non-tender; bowel sounds normal; no masses,  no organomegaly Extremities: extremities normal, atraumatic, no cyanosis or edema Pulses: 2+ and symmetric        Assessment & Plan:  GERD Pt appears to be doing well, pt wants to decrease meds so will stop rantidine, warned pt of what to look out for.   HYPERTENSION, BENIGN SYSTEMIC meds are controlling blood pressure, no need  for labs, follow up in 3 months.   HYPOTHYROIDISM, UNSPECIFIED Lab Results  Component Value Date   TSH 23.259* 07/20/2010   Pt feels good will get check at next appointment, did not have time today.

## 2010-12-21 NOTE — Assessment & Plan Note (Signed)
Pt appears to be doing well, pt wants to decrease meds so will stop rantidine, warned pt of what to look out for.

## 2010-12-21 NOTE — Assessment & Plan Note (Signed)
Lab Results  Component Value Date   TSH 23.259* 07/20/2010   Pt feels good will get check at next appointment, did not have time today.

## 2010-12-24 ENCOUNTER — Ambulatory Visit: Payer: Medicare Other | Admitting: Family Medicine

## 2010-12-28 ENCOUNTER — Ambulatory Visit: Payer: Medicare Other | Admitting: Family Medicine

## 2011-02-18 ENCOUNTER — Encounter: Payer: Self-pay | Admitting: Family Medicine

## 2011-02-18 ENCOUNTER — Ambulatory Visit (INDEPENDENT_AMBULATORY_CARE_PROVIDER_SITE_OTHER): Payer: BC Managed Care – PPO | Admitting: Family Medicine

## 2011-02-18 VITALS — BP 166/95 | HR 74 | Temp 97.7°F | Wt 133.0 lb

## 2011-02-18 DIAGNOSIS — I10 Essential (primary) hypertension: Secondary | ICD-10-CM

## 2011-02-18 DIAGNOSIS — J069 Acute upper respiratory infection, unspecified: Secondary | ICD-10-CM | POA: Insufficient documentation

## 2011-02-18 MED ORDER — HYDROCHLOROTHIAZIDE 25 MG PO TABS: 25.0000 mg | ORAL_TABLET | Freq: Every day | ORAL | Status: DC

## 2011-02-18 NOTE — Patient Instructions (Addendum)
Thank you for coming in today. I think you have a cold. These typically last 1-2 weeks.  Try some OTC nasal sprays like affrin for up to 3 days.  Avoid COLD formulations especially day time meds as this will increase your blood pressure.  You can use tylenol for that "Sick" feeling. Watch out for extra tylenol (acteiminephen) hiding in cold medicine. You should not take more than 1000mg  of tylenol every 6-8 hours.  Use a humidifier.   I am refilling your HCTZ blood pressure medicine.  I would like you to see your doctor in 1 month for a BP recheck as your pressure if not safe for long term.   You can schedule a "Physical"

## 2011-02-18 NOTE — Assessment & Plan Note (Signed)
Blood pressure elevated. Patient is not taking hydrochlorothiazide, as she has run out. Refill hydrochlorothiazide and encourage patient to resume taking it. She will followup in one month with her primary care provider for blood pressure recheck and basic metabolic panel

## 2011-02-18 NOTE — Progress Notes (Signed)
UPPER RESPIRATORY INFECTION  Onset: 2 weeks  Course: getting better but still there Better with: tried Aspirin, nyquil Meds tried: cold meds Sick contacts: Granddaughter with virus  Nasal discharge (color,laterality): Clear BL  Sinusitis Risk Factors Fever: no   Headache/face pain: no  Double sickening: no  Tooth pain: no   Allergy Risk Factors: Sneezing: yes  Itchy scratchy throat: no  Seasonal sx: no   Flu Risk Factors Headache: no  Muscle aches: no  Severe fatigue: no    Red Flags  Stiff neck: no  Dyspnea: no  Rash: no  Swallowing difficulty: no  General congestion.   HTN: Out of hydrochlorothiazide. No chest pain dyspnea palpitations shortness of breath or edema.  PMH reviewed.  ROS as above otherwise neg Medications reviewed.  Exam:  BP 166/95  Pulse 74  Temp(Src) 97.7 F (36.5 C) (Oral)  Wt 133 lb (60.328 kg) Gen: Well NAD HEENT: EOMI,  MMM, TM nl BL Lungs: CTABL Nl WOB Heart: RRR no MRG Abd: NABS, NT, ND Exts: Non edematous BL  LE, warm and well perfused.

## 2011-02-18 NOTE — Assessment & Plan Note (Signed)
Advised symptomatic treatment with Tylenol over-the-counter nasal spray and humidifier. Give red flag signs and symptoms. Patient expressed understanding

## 2011-02-21 LAB — CBC
HCT: 30.6 — ABNORMAL LOW
MCHC: 33.3
Platelets: 288
Platelets: 316
RBC: 3.2 — ABNORMAL LOW
RBC: 3.4 — ABNORMAL LOW
WBC: 7

## 2011-02-21 LAB — DIFFERENTIAL
Eosinophils Relative: 2
Lymphocytes Relative: 30
Lymphs Abs: 2.1

## 2011-02-21 LAB — PROTIME-INR
INR: 0.9
Prothrombin Time: 12.2

## 2011-03-17 NOTE — Progress Notes (Signed)
  Subjective:    Patient ID: Beth Harper, female    DOB: Jul 05, 1940, 70 y.o.   MRN: [id number]  HPI Subjective:     Objective:    Assessment:   Plan:     Review of Systems     Objective:   Physical Exam        Assessment & Plan:

## 2011-03-18 ENCOUNTER — Telehealth: Payer: Self-pay | Admitting: Family Medicine

## 2011-03-18 ENCOUNTER — Encounter: Payer: Self-pay | Admitting: Family Medicine

## 2011-03-18 ENCOUNTER — Ambulatory Visit (INDEPENDENT_AMBULATORY_CARE_PROVIDER_SITE_OTHER): Payer: BC Managed Care – PPO | Admitting: Family Medicine

## 2011-03-18 VITALS — BP 164/82 | HR 89 | Temp 97.9°F | Ht 62.0 in | Wt 132.6 lb

## 2011-03-18 DIAGNOSIS — I10 Essential (primary) hypertension: Secondary | ICD-10-CM

## 2011-03-18 LAB — BASIC METABOLIC PANEL
Glucose, Bld: 79 mg/dL (ref 70–99)
Potassium: 4 mEq/L (ref 3.5–5.3)
Sodium: 143 mEq/L (ref 135–145)

## 2011-03-18 MED ORDER — HYDROCHLOROTHIAZIDE 25 MG PO TABS: 25.0000 mg | ORAL_TABLET | Freq: Every day | ORAL | Status: DC

## 2011-03-18 MED ORDER — LISINOPRIL 40 MG PO TABS: 40.0000 mg | ORAL_TABLET | Freq: Every day | ORAL | Status: DC

## 2011-03-18 NOTE — Progress Notes (Signed)
S: Pt comes in today for f/u HTN.  HYPERTENSION BP: 16482 Meds: HCTZ, lisinopril Taking meds: Yes : only taking lisinopril (bottle is empty but pt states that's b/c pills are in pill box), NOT taking HCTZ: According to the patient, she was unable to afford this last month. She states that she should be able to afford it this month. She was made aware that it is on the four-hour list at [facility], and if she needs we can send in a prescription to [facility] rather than [facility]. Patient states she's unsure how expensive it is at [facility] because she never even called to ask last month.    # of doses missed/week: 2 Symptoms: Headache: No Dizziness: No Vision changes: No SOB:  No Chest pain: No LE swelling: No Tobacco use: No     ROS: Per HPI  History  Smoking status  . Never Smoker   Smokeless tobacco  . Never Used    O:  Filed Vitals:   03/18/11 0846  BP: 164/82  Pulse: 89  Temp: 97.9 F (36.6 C)    Gen: NAD, very pleasant  CV: RRR, no murmur Pulm: CTA bilat, no wheezes or crackles Abd: soft, NT Ext: Warm, no chronic skin changes, no edema, dry skin   A/P: 70 y.o. female p/w HTN -See problem list -f/u in 1 month with PCP

## 2011-03-18 NOTE — Assessment & Plan Note (Signed)
Pt continues not taking HCTZ.  States she is taking lisinopril, although bottle is empty.  Encouraged pt to fill HCTZ and start taking it. BMET checked.  F/u 1 month

## 2011-03-18 NOTE — Telephone Encounter (Signed)
Called pt on home phone (pt no longer at this # per receiver). Called pt on mobile #. Left VM informing pt that I am at a delivery, and id possible she should re-scheduled HTN f/u. I have an opening on 03/25/11. If she needs to be seen this AM, she may be able to see one of my partners. Asked pt to call clinic to re-schedule if she is able to do so.

## 2011-03-18 NOTE — Patient Instructions (Signed)
We are drawing one lab today.  You can expect a letter in the next few weeks with the results.  PLEASE PICK UP YOUR OTHER BLOOD PRESSURE MEDICINE! If it is expensive at [facility], we can send in a script to [facility] (HCTZ will only be $4 there) Try to check your blood pressures 1-2 times per week.  You can go to places such as [facility], [facility], [facility], etc. to do this.  Write these numbers down and bring them with you when you come to see Dr. Armen Pickup. Please see Dr. Armen Pickup in 1 month for a blood pressure check.

## 2011-03-20 ENCOUNTER — Encounter: Payer: Self-pay | Admitting: Family Medicine

## 2011-03-23 ENCOUNTER — Encounter: Payer: Self-pay | Admitting: Home Health Services

## 2011-04-21 ENCOUNTER — Ambulatory Visit (INDEPENDENT_AMBULATORY_CARE_PROVIDER_SITE_OTHER): Payer: BC Managed Care – PPO | Admitting: Family Medicine

## 2011-04-21 ENCOUNTER — Encounter: Payer: Self-pay | Admitting: Family Medicine

## 2011-04-21 DIAGNOSIS — I10 Essential (primary) hypertension: Secondary | ICD-10-CM

## 2011-04-21 DIAGNOSIS — J40 Bronchitis, not specified as acute or chronic: Secondary | ICD-10-CM

## 2011-04-21 DIAGNOSIS — E785 Hyperlipidemia, unspecified: Secondary | ICD-10-CM

## 2011-04-21 DIAGNOSIS — K219 Gastro-esophageal reflux disease without esophagitis: Secondary | ICD-10-CM

## 2011-04-21 MED ORDER — AZITHROMYCIN 250 MG PO TABS: ORAL_TABLET | ORAL | Status: AC

## 2011-04-21 NOTE — Patient Instructions (Signed)
It was very nice to meet you today! It looks like you have an infection called bronchitis. I am going to prescribe you with an antibiotic for it called azithromycin. Here is a bit of information about bronchitis:   Acute Bronchitis Bronchitis is when the organs and tissues involved in breathing get puffy (swollen) and can leak fluid. This makes it harder for air to get in and out of the lungs. You may cough a lot and produce thick spit (mucus). Acute means the illness started suddenly. HOME CARE  Rest.     Drink enough fluids to keep the pee (urine) clear or pale yellow.     Medicines may be given that will open up your airways to help you breathe better. Only take medicine as told by your doctor.     Use a cool mist vaporizer. This will help to thin any thick spit.     Do not smoke. Avoid secondhand smoke.  GET HELP RIGHT AWAY IF:    You have a temperature by mouth above 102 F (38.9 C), not controlled by medicine.     You have chills.     You develop severe shortness of breath or chest pain.     You have bloody spit mixed with mucus (sputum).     You throw up (vomit) often.     You lose too much body fluid (dehydrated).     You have a severe headache.     You feel faint.     You do not improve after 1 week of treatment.  MAKE SURE YOU:    Understand these instructions.     Will watch your condition.     Will get help right away if you are not doing well or get worse.  Document Released: 10/19/2007 Document Revised: 01/12/2011 Document Reviewed: 05/20/2009 [facility] Patient Information 2012 [place], [place].

## 2011-04-23 DIAGNOSIS — J40 Bronchitis, not specified as acute or chronic: Secondary | ICD-10-CM | POA: Insufficient documentation

## 2011-04-23 MED ORDER — LEVOTHYROXINE SODIUM 100 MCG PO TABS: 50.0000 ug | ORAL_TABLET | Freq: Every day | ORAL | Status: DC

## 2011-04-23 MED ORDER — LISINOPRIL 40 MG PO TABS: 40.0000 mg | ORAL_TABLET | Freq: Every day | ORAL | Status: DC

## 2011-04-23 MED ORDER — HYDROCHLOROTHIAZIDE 25 MG PO TABS: 25.0000 mg | ORAL_TABLET | Freq: Every day | ORAL | Status: DC

## 2011-04-23 MED ORDER — RANITIDINE HCL 150 MG PO TABS: 150.0000 mg | ORAL_TABLET | Freq: Two times a day (BID) | ORAL | Status: DC

## 2011-04-23 MED ORDER — SIMVASTATIN 40 MG PO TABS: 40.0000 mg | ORAL_TABLET | Freq: Every day | ORAL | Status: DC

## 2011-04-23 NOTE — Progress Notes (Signed)
  Subjective:    Patient ID: Beth Harper, female    DOB: 10/26/1940, 70 y.o.   MRN: [id number]  HPI 70 year old female who presents with 6 day history of yellow sputum productive cough and feeling of chest congestion. Denies any sore throat earache or shortness of breath. Reports having softer bowel movements than usual. Denies any sick contacts. Denies any objective fever. Denies any chills. Has not taken anything for it. Had a flu shot in November. Denies any nausea or vomiting. Denies any headache. Denies any smoking.   Review of Systems Negative except per history of present illness    Objective:   Physical Exam Filed Vitals:   04/21/11 0914  BP: 146/63  Pulse: 81  Temp: 98 F (36.7 C)  RR: 12. O2 sat: 96%  Physical Examination: General appearance - alert, well appearing, and in no distress, no toxic appearing Ears - bilateral TM's and external ear canals normal Nose - normal and patent, no erythema, discharge or polyps. No tenderness to palpation of sinuses. Mouth - mucous membranes moist, pharynx normal without lesions, non erythematous Neck - supple, no cervical lymphadenopathy Chest - coarse breath sounds in upper lung fields bilaterally. Good air movement throughout. No wheezes or evidence of consolidation.  Heart - normal rate, regular rhythm, normal S1, S2, no murmurs, rubs, clicks or gallops Abdomen - soft, nontender, nondistended, no masses or organomegaly       Assessment & Plan:

## 2011-04-23 NOTE — Assessment & Plan Note (Signed)
Blood pressure mildly elevated. Refilled lisinopril and hydrochlorothiazide per patient request. Patient will need to follow up with Dr. Armen Pickup.

## 2011-04-23 NOTE — Assessment & Plan Note (Addendum)
Given course breast sounds on exam as well as 7 day duration without significant improvement this could be bacterial bronchitis. Will prescribe Z-Pak. No evidence of dyspnea, hypoxemia or consolidation on lung exam making pneumonia less likely. Will treat as outpatient. Patient advised to return to clinic or ED if she were to have persistent fever, difficulty breathing or if symptoms worsened after treatment.

## 2011-04-23 NOTE — Assessment & Plan Note (Signed)
Refilled simvastatin given that patient was about to run out

## 2011-08-29 ENCOUNTER — Ambulatory Visit (INDEPENDENT_AMBULATORY_CARE_PROVIDER_SITE_OTHER): Payer: BC Managed Care – PPO | Admitting: Family Medicine

## 2011-08-29 ENCOUNTER — Encounter: Payer: Self-pay | Admitting: Family Medicine

## 2011-08-29 VITALS — BP 154/81 | HR 84 | Temp 98.2°F | Ht 62.0 in | Wt 121.0 lb

## 2011-08-29 DIAGNOSIS — K219 Gastro-esophageal reflux disease without esophagitis: Secondary | ICD-10-CM

## 2011-08-29 DIAGNOSIS — D649 Anemia, unspecified: Secondary | ICD-10-CM

## 2011-08-29 DIAGNOSIS — E039 Hypothyroidism, unspecified: Secondary | ICD-10-CM

## 2011-08-29 DIAGNOSIS — I10 Essential (primary) hypertension: Secondary | ICD-10-CM

## 2011-08-29 DIAGNOSIS — E785 Hyperlipidemia, unspecified: Secondary | ICD-10-CM

## 2011-08-29 DIAGNOSIS — R634 Abnormal weight loss: Secondary | ICD-10-CM

## 2011-08-29 HISTORY — DX: Abnormal weight loss: R63.4

## 2011-08-29 LAB — COMPREHENSIVE METABOLIC PANEL
ALT: 9 U/L (ref 0–35)
AST: 15 U/L (ref 0–37)
Alkaline Phosphatase: 40 U/L (ref 39–117)
Creat: 1.09 mg/dL (ref 0.50–1.10)
Sodium: 139 mEq/L (ref 135–145)
Total Bilirubin: 0.6 mg/dL (ref 0.3–1.2)
Total Protein: 7.1 g/dL (ref 6.0–8.3)

## 2011-08-29 LAB — LIPID PANEL
HDL: 74 mg/dL (ref >39)
LDL Cholesterol: 164 mg/dL — ABNORMAL HIGH (ref 0–99)
Total CHOL/HDL Ratio: 3.5 Ratio
Triglycerides: 103 mg/dL (ref <150)
VLDL: 21 mg/dL (ref 0–40)

## 2011-08-29 LAB — CBC
HCT: 34.5 % — ABNORMAL LOW (ref 36.0–46.0)
Hemoglobin: 10.8 g/dL — ABNORMAL LOW (ref 12.0–15.0)
MCV: 91.3 fL (ref 78.0–100.0)
RBC: 3.78 MIL/uL — ABNORMAL LOW (ref 3.87–5.11)
RDW: 16.8 % — ABNORMAL HIGH (ref 11.5–15.5)
WBC: 3.9 10*3/uL — ABNORMAL LOW (ref 4.0–10.5)

## 2011-08-29 MED ORDER — ZOSTER VACCINE LIVE 19400 UNT/0.65ML ~~LOC~~ SOLR: 0.6500 mL | Freq: Once | SUBCUTANEOUS | Status: AC

## 2011-08-29 MED ORDER — CALCIUM-VITAMIN D 500-200 MG-UNIT PO TABS: 1.0000 | ORAL_TABLET | Freq: Two times a day (BID) | ORAL | Status: AC

## 2011-08-29 NOTE — Assessment & Plan Note (Signed)
A: weight loss with normal BMI. Differential include depression, hyperthyroidism (from over treated hypothyroidism), malignancy and normal weight loss.  P: -check TSH -Check electrolytes and CBC -send for screening colonoscopy and mammogram as patient is overdue.  -f/u based on results. PHQ-9 at f/u appt.

## 2011-08-29 NOTE — Patient Instructions (Signed)
Mrs. Faeth,  Thank you very much for coming in to see me today.  Please set up your colonoscopy and mammogram.   I will call or send a letter with your blood work results. I will wait on refilling your medication until I have your blood work.  Please plan on seeing me again in 6mos- 1year or sooner if needed based on the labs and studies.   Dr. Conley Canal

## 2011-08-29 NOTE — Progress Notes (Signed)
Subjective:     Patient ID: Tyana Butzer, female   DOB: 02-Oct-1940, 71 y.o.   MRN: [id number]  HPI 71 yo F present to discuss the following:  1. Weight loss: reports puposeless weight loss over last year. This times last year she weight 134 lbs. Reports decreased appetite. Admits to eating all foods but avoiding coffee and spicy foods given history of diverticulitis. Admit to having access to food (on social security but adult son assist with bills and buying food). Denies fever, chills, myalgias or arthralgias. Works 2x per month as shift Merchandiser, retail at the [employer]. Denies fatigue. Admit to a few episodes of night sweats. Denies abdominal pain, loose stools, constipation and blood in the stool.   2. HTN: taking BP medication as prescribed. Denies CP, SOB, cough or LE edema.   3. Hypothyroidism: takes synthroid as prescribed. Denies palpitations, frequent headaches and additional ROS as per above.   4. Health maintenance: due to mammogram, due for colonoscopy has history of colonic resection and diverticulitis. Due for zostavax no history of chicken pox. No family history of osteoporosis does not take calcium or v it D supplement. No personal history of fractures of falls.   Review of Systems As per HPI     Objective:   Physical Exam BP 154/81  Pulse 84  Temp(Src) 98.2 F (36.8 C) (Oral)  Ht 5\' 2"  (1.575 m)  Wt 121 lb (54.885 kg)  BMI 22.13 kg/m2 Wt Readings from Last 3 Encounters:  08/29/11 121 lb (54.885 kg)  04/21/11 124 lb 11.2 oz (56.564 kg)  03/18/11 132 lb 9.6 oz (60.147 kg)   General appearance: alert, cooperative, appears stated age and no distress Neck: no adenopathy, no carotid bruit, no JVD, supple, symmetrical, trachea midline and thyroid not enlarged, symmetric, no tenderness/mass/nodules Lungs: clear to auscultation bilaterally Heart: regular rate and rhythm, S1, S2 normal, no murmur, click, rub or gallop Abdomen: soft, non-tender; bowel sounds normal; no  masses,  no organomegaly Extremities: extremities normal, atraumatic, no cyanosis or edema    Assessment and Plan:  See problem list

## 2011-08-29 NOTE — Assessment & Plan Note (Signed)
Lab Results  Component Value Date   TSH 23.259* 07/20/2010   Checking TSH. Patient may be over treated and clinically hyperthyroid which may be attributing to weight loss.

## 2011-08-29 NOTE — Assessment & Plan Note (Signed)
BP elevated but near goal given age. Will check electrolytes and continue current medications which patient is tolerating well.

## 2011-08-30 ENCOUNTER — Telehealth: Payer: Self-pay | Admitting: Family Medicine

## 2011-08-30 ENCOUNTER — Encounter: Payer: Self-pay | Admitting: Family Medicine

## 2011-08-30 MED ORDER — LISINOPRIL 40 MG PO TABS: 40.0000 mg | ORAL_TABLET | Freq: Every day | ORAL | Status: DC

## 2011-08-30 MED ORDER — HYDROCHLOROTHIAZIDE 25 MG PO TABS: 25.0000 mg | ORAL_TABLET | Freq: Every day | ORAL | Status: DC

## 2011-08-30 MED ORDER — PRAVASTATIN SODIUM 80 MG PO TABS: 80.0000 mg | ORAL_TABLET | Freq: Every day | ORAL | Status: DC

## 2011-08-30 MED ORDER — RANITIDINE HCL 150 MG PO TABS: 150.0000 mg | ORAL_TABLET | Freq: Every day | ORAL | Status: DC

## 2011-08-30 MED ORDER — LEVOTHYROXINE SODIUM 75 MCG PO TABS: 75.0000 ug | ORAL_TABLET | Freq: Every day | ORAL | Status: DC

## 2011-08-30 NOTE — Telephone Encounter (Signed)
Attempted to call patient with lab results and to discuss medication changes. Called twice and unable to leave voicemail. Letter has been sent to admin to be printed and mailed to patient.

## 2011-08-30 NOTE — Progress Notes (Signed)
Addended by: Dessa Phi on: 08/30/2011 08:53 AM   Modules accepted: Orders

## 2011-09-14 ENCOUNTER — Other Ambulatory Visit: Payer: Self-pay | Admitting: Family Medicine

## 2011-09-14 ENCOUNTER — Telehealth: Payer: Self-pay | Admitting: Family Medicine

## 2011-09-14 DIAGNOSIS — Z1231 Encounter for screening mammogram for malignant neoplasm of breast: Secondary | ICD-10-CM

## 2011-09-14 NOTE — Telephone Encounter (Signed)
Called the breast imaging center of [place]. Informed receptionist that the patient has been trying to call to schedule a screening mammogram and has not been able to per her report. Gave update phone # [phone]. Receptionist states that she will give the patient a call.

## 2011-09-26 ENCOUNTER — Ambulatory Visit
Admission: RE | Admit: 2011-09-26 | Discharge: 2011-09-26 | Disposition: A | Discharge status: Home / Self Care | Payer: Medicare Other | Source: Ambulatory Visit | Attending: Family Medicine | Admitting: Family Medicine

## 2011-09-26 DIAGNOSIS — Z1231 Encounter for screening mammogram for malignant neoplasm of breast: Secondary | ICD-10-CM

## 2012-02-28 ENCOUNTER — Encounter (HOSPITAL_COMMUNITY): Payer: Self-pay | Admitting: Emergency Medicine

## 2012-02-28 ENCOUNTER — Inpatient Hospital Stay (HOSPITAL_COMMUNITY)
Admission: EM | Admit: 2012-02-28 | Discharge: 2012-03-07 | DRG: 378 | Disposition: A | Discharge status: Home / Self Care | Payer: Medicare Other | Attending: Family Medicine | Admitting: Family Medicine

## 2012-02-28 DIAGNOSIS — R000 Tachycardia, unspecified: Secondary | ICD-10-CM | POA: Diagnosis present

## 2012-02-28 DIAGNOSIS — I9589 Other hypotension: Secondary | ICD-10-CM | POA: Diagnosis present

## 2012-02-28 DIAGNOSIS — Z7982 Long term (current) use of aspirin: Secondary | ICD-10-CM

## 2012-02-28 DIAGNOSIS — D649 Anemia, unspecified: Secondary | ICD-10-CM | POA: Diagnosis present

## 2012-02-28 DIAGNOSIS — K625 Hemorrhage of anus and rectum: Secondary | ICD-10-CM

## 2012-02-28 DIAGNOSIS — N289 Disorder of kidney and ureter, unspecified: Secondary | ICD-10-CM | POA: Diagnosis not present

## 2012-02-28 DIAGNOSIS — Z87891 Personal history of nicotine dependence: Secondary | ICD-10-CM

## 2012-02-28 DIAGNOSIS — E039 Hypothyroidism, unspecified: Secondary | ICD-10-CM | POA: Diagnosis present

## 2012-02-28 DIAGNOSIS — Z8719 Personal history of other diseases of the digestive system: Secondary | ICD-10-CM | POA: Diagnosis present

## 2012-02-28 DIAGNOSIS — Z79899 Other long term (current) drug therapy: Secondary | ICD-10-CM

## 2012-02-28 DIAGNOSIS — K922 Gastrointestinal hemorrhage, unspecified: Secondary | ICD-10-CM

## 2012-02-28 DIAGNOSIS — D62 Acute posthemorrhagic anemia: Secondary | ICD-10-CM | POA: Diagnosis present

## 2012-02-28 DIAGNOSIS — I959 Hypotension, unspecified: Secondary | ICD-10-CM | POA: Diagnosis present

## 2012-02-28 DIAGNOSIS — K219 Gastro-esophageal reflux disease without esophagitis: Secondary | ICD-10-CM | POA: Diagnosis present

## 2012-02-28 DIAGNOSIS — K5731 Diverticulosis of large intestine without perforation or abscess with bleeding: Principal | ICD-10-CM | POA: Diagnosis present

## 2012-02-28 DIAGNOSIS — E785 Hyperlipidemia, unspecified: Secondary | ICD-10-CM | POA: Diagnosis present

## 2012-02-28 DIAGNOSIS — I10 Essential (primary) hypertension: Secondary | ICD-10-CM | POA: Diagnosis present

## 2012-02-28 DIAGNOSIS — D689 Coagulation defect, unspecified: Secondary | ICD-10-CM

## 2012-02-28 DIAGNOSIS — D696 Thrombocytopenia, unspecified: Secondary | ICD-10-CM | POA: Diagnosis not present

## 2012-02-28 HISTORY — DX: Hyperlipidemia, unspecified: E78.5

## 2012-02-28 HISTORY — DX: Hypothyroidism, unspecified: E03.9

## 2012-02-28 HISTORY — DX: Diverticulosis of small intestine without perforation or abscess without bleeding: K57.10

## 2012-02-28 HISTORY — DX: Essential (primary) hypertension: I10

## 2012-02-28 LAB — COMPREHENSIVE METABOLIC PANEL
AST: 14 U/L (ref 0–37)
Albumin: 3.6 g/dL (ref 3.5–5.2)
BUN: 20 mg/dL (ref 6–23)
CO2: 25 mEq/L (ref 19–32)
Calcium: 9.3 mg/dL (ref 8.4–10.5)
Chloride: 109 mEq/L (ref 96–112)
Creatinine, Ser: 1.1 mg/dL (ref 0.50–1.10)
GFR calc non Af Amer: 49 mL/min — ABNORMAL LOW (ref >90)
Total Bilirubin: 0.5 mg/dL (ref 0.3–1.2)

## 2012-02-28 LAB — CBC WITH DIFFERENTIAL/PLATELET
Basophils Absolute: 0.1 10*3/uL (ref 0.0–0.1)
Basophils Relative: 1 % (ref 0–1)
Eosinophils Relative: 2 % (ref 0–5)
HCT: 30.3 % — ABNORMAL LOW (ref 36.0–46.0)
Hemoglobin: 10 g/dL — ABNORMAL LOW (ref 12.0–15.0)
MCHC: 33 g/dL (ref 30.0–36.0)
MCV: 89.4 fL (ref 78.0–100.0)
Monocytes Absolute: 0.3 10*3/uL (ref 0.1–1.0)
Monocytes Relative: 6 % (ref 3–12)
Neutro Abs: 2.9 10*3/uL (ref 1.7–7.7)
RDW: 14.7 % (ref 11.5–15.5)

## 2012-02-28 LAB — CBC
HCT: 23.8 % — ABNORMAL LOW (ref 36.0–46.0)
Platelets: 246 10*3/uL (ref 150–400)
RBC: 2.65 MIL/uL — ABNORMAL LOW (ref 3.87–5.11)
RDW: 14.8 % (ref 11.5–15.5)
WBC: 5.9 10*3/uL (ref 4.0–10.5)

## 2012-02-28 LAB — PROTIME-INR
INR: 0.97 (ref 0.00–1.49)
Prothrombin Time: 12.8 seconds (ref 11.6–15.2)

## 2012-02-28 LAB — ABO/RH: ABO/RH(D): AB POS

## 2012-02-28 LAB — APTT: aPTT: 21 seconds — ABNORMAL LOW (ref 24–37)

## 2012-02-28 LAB — OCCULT BLOOD, POC DEVICE: Fecal Occult Bld: POSITIVE

## 2012-02-28 MED ORDER — SODIUM CHLORIDE 0.9 % IJ SOLN
3.0000 mL | Freq: Two times a day (BID) | INTRAMUSCULAR | Status: DC
  Administered 2012-02-28 – 2012-03-01 (×3): 3 mL via INTRAVENOUS
  Administered 2012-03-02 – 2012-03-03 (×2): via INTRAVENOUS
  Administered 2012-03-04: 3 mL via INTRAVENOUS
  Administered 2012-03-04: 23:00:00 via INTRAVENOUS
  Administered 2012-03-05 – 2012-03-06 (×3): 3 mL via INTRAVENOUS

## 2012-02-28 MED ORDER — SODIUM CHLORIDE 0.9 % IV SOLN: Freq: Once | INTRAVENOUS | Status: DC

## 2012-02-28 MED ORDER — ACETAMINOPHEN 325 MG PO TABS
650.0000 mg | ORAL_TABLET | Freq: Four times a day (QID) | ORAL | Status: DC | PRN
  Administered 2012-02-29 – 2012-03-03 (×4): 650 mg via ORAL
  Filled 2012-02-28 (×4): qty 2

## 2012-02-28 MED ORDER — LEVOTHYROXINE SODIUM 75 MCG PO TABS
75.0000 ug | ORAL_TABLET | Freq: Every day | ORAL | Status: DC
  Administered 2012-02-28 – 2012-03-02 (×4): 75 ug via ORAL
  Filled 2012-02-28 (×6): qty 1

## 2012-02-28 MED ORDER — ACETAMINOPHEN 650 MG RE SUPP: 650.0000 mg | Freq: Four times a day (QID) | RECTAL | Status: DC | PRN

## 2012-02-28 MED ORDER — SODIUM CHLORIDE 0.9 % IV BOLUS (SEPSIS)
1000.0000 mL | Freq: Once | INTRAVENOUS | Status: AC
  Administered 2012-02-28: 1000 mL via INTRAVENOUS

## 2012-02-28 MED ORDER — ONDANSETRON HCL 4 MG PO TABS: 4.0000 mg | ORAL_TABLET | Freq: Four times a day (QID) | ORAL | Status: DC | PRN

## 2012-02-28 MED ORDER — FAMOTIDINE 20 MG PO TABS
20.0000 mg | ORAL_TABLET | Freq: Every day | ORAL | Status: DC
  Administered 2012-02-28 – 2012-03-02 (×3): 20 mg via ORAL
  Filled 2012-02-28 (×6): qty 1

## 2012-02-28 MED ORDER — ONDANSETRON HCL 4 MG/2ML IJ SOLN
4.0000 mg | Freq: Four times a day (QID) | INTRAMUSCULAR | Status: DC | PRN
  Administered 2012-03-01: 4 mg via INTRAVENOUS
  Filled 2012-02-28 (×2): qty 2

## 2012-02-28 MED ORDER — SODIUM CHLORIDE 0.9 % IV SOLN
INTRAVENOUS | Status: DC
  Administered 2012-02-28 – 2012-03-03 (×4): via INTRAVENOUS

## 2012-02-28 NOTE — ED Notes (Signed)
Myself and Aten, RN chaperoned Beth Fleeting, Md with rectal examination and collection of feces sample

## 2012-02-28 NOTE — H&P (Signed)
Family Medicine Teaching Service History and Physical  Chief Complaint: Bright red blood in stool  HPI: Beth Harper is a 71 year old female who presented to the ER this morning after two episodes of bloody stools.  The first occurrence was about 5:30 in the morning, and there was blood mixed with stool and red blood, she and her son describe a large amount.  She felt OK after that episode, so she told her son to go ahead and go to work.  Then, around 6:45 am she had another bloody bowel movement, and this time there were also blood clots in the toilet bowl.  She felt light headed at that time, and her son brought her to the ER.  She was initially hypotensive with a blood pressure of 82/64, which improved.  She has not had another bowel movement since getting to the hospital, but was heme-occult positive.   She denies any fevers, chills, nausea, vomiting, or abdominal pain.  She has a history of diverticulitis causing a small bowel obstruction in 2001.  At that time she had a hemicolectomy and ostomy- but the ostomy has been reversed. The patient states she has otherwise been in her usual state of health.  She takes only an aspirin, no other anticoagulation.   Past Medical History  Diagnosis Date  . Hypertension   . Diverticula, small intestine    Surgical History: See above.   Family History: Non-contributory.  Social History:  reports that she has quit smoking. She has never used smokeless tobacco. She reports that she drinks alcohol. Her drug history not on file.  Allergies: No Known Allergies No current facility-administered medications on file prior to encounter.   Current Outpatient Prescriptions on File Prior to Encounter  Medication Sig Dispense Refill  . acetaminophen (TYLENOL) 500 MG tablet Take 500 mg by mouth every 6 (six) hours as needed. For pain      . aspirin 81 MG EC tablet Take 81 mg by mouth daily as needed. For pain/daily maintenance  When she remembers      . Calcium  Carbonate-Vitamin D (CALCIUM-VITAMIN D) 500-200 MG-UNIT per tablet Take 1 tablet by mouth 2 (two) times daily with a meal.  60 tablet  11  . hydrochlorothiazide (HYDRODIURIL) 25 MG tablet Take 1 tablet (25 mg total) by mouth daily.  90 tablet  3  . levothyroxine (SYNTHROID, LEVOTHROID) 75 MCG tablet Take 1 tablet (75 mcg total) by mouth daily.  90 tablet  1  . lisinopril (PRINIVIL,ZESTRIL) 40 MG tablet Take 1 tablet (40 mg total) by mouth daily.  90 tablet  3  . pravastatin (PRAVACHOL) 80 MG tablet Take 1 tablet (80 mg total) by mouth daily.  90 tablet  1  . ranitidine (ZANTAC) 150 MG tablet Take 1 tablet (150 mg total) by mouth at bedtime.  90 tablet  3     Results for orders placed during the hospital encounter of 02/28/12 (from the past 48 hour(s))  OCCULT BLOOD, POC DEVICE     Status: Normal   Collection Time   02/28/12 10:01 AM      Component Value Range Comment   Fecal Occult Bld POSITIVE     CBC WITH DIFFERENTIAL     Status: Abnormal   Collection Time   02/28/12 10:14 AM      Component Value Range Comment   WBC 4.5  4.0 - 10.5 K/uL    RBC 3.39 (*) 3.87 - 5.11 MIL/uL    Hemoglobin 10.0 (*)  12.0 - 15.0 g/dL    HCT 16.1 (*) 09.6 - 46.0 %    MCV 89.4  78.0 - 100.0 fL    MCH 29.5  26.0 - 34.0 pg    MCHC 33.0  30.0 - 36.0 g/dL    RDW 04.5  40.9 - 81.1 %    Platelets 269  150 - 400 K/uL    Neutrophils Relative 65  43 - 77 %    Neutro Abs 2.9  1.7 - 7.7 K/uL    Lymphocytes Relative 26  12 - 46 %    Lymphs Abs 1.2  0.7 - 4.0 K/uL    Monocytes Relative 6  3 - 12 %    Monocytes Absolute 0.3  0.1 - 1.0 K/uL    Eosinophils Relative 2  0 - 5 %    Eosinophils Absolute 0.1  0.0 - 0.7 K/uL    Basophils Relative 1  0 - 1 %    Basophils Absolute 0.1  0.0 - 0.1 K/uL   COMPREHENSIVE METABOLIC PANEL     Status: Abnormal   Collection Time   02/28/12 10:14 AM      Component Value Range Comment   Sodium 141  135 - 145 mEq/L    Potassium 4.4  3.5 - 5.1 mEq/L    Chloride 109  96 - 112 mEq/L      CO2 25  19 - 32 mEq/L    Glucose, Bld 96  70 - 99 mg/dL    BUN 20  6 - 23 mg/dL    Creatinine, Ser 9.14  0.50 - 1.10 mg/dL    Calcium 9.3  8.4 - 78.2 mg/dL    Total Protein 6.7  6.0 - 8.3 g/dL    Albumin 3.6  3.5 - 5.2 g/dL    AST 14  0 - 37 U/L    ALT 7  0 - 35 U/L    Alkaline Phosphatase 31 (*) 39 - 117 U/L    Total Bilirubin 0.5  0.3 - 1.2 mg/dL    GFR calc non Af Amer 49 (*) >90 mL/min    GFR calc Af Amer 56 (*) >90 mL/min   APTT     Status: Abnormal   Collection Time   02/28/12 10:14 AM      Component Value Range Comment   aPTT 21 (*) 24 - 37 seconds   PROTIME-INR     Status: Normal   Collection Time   02/28/12 10:14 AM      Component Value Range Comment   Prothrombin Time 12.8  11.6 - 15.2 seconds    INR 0.97  0.00 - 1.49   TYPE AND SCREEN     Status: Normal   Collection Time   02/28/12 10:15 AM      Component Value Range Comment   ABO/RH(D) AB POS      Antibody Screen NEG      Sample Expiration 03/02/2012     LACTIC ACID, PLASMA     Status: Normal   Collection Time   02/28/12 10:15 AM      Component Value Range Comment   Lactic Acid, Venous 1.5  0.5 - 2.2 mmol/L    No results found.  ROS 12-Point ROS negative except stated in HPI  Blood pressure 115/72, pulse 65, temperature 98.3 F (36.8 C), resp. rate 16, SpO2 100.00%. Physical Exam  BP 115/72  Pulse 65  Temp 98.3 F (36.8 C)  Resp 16  SpO2 100% General appearance: alert,  cooperative and no distress Neck: no JVD and supple, symmetrical, trachea midline Lungs: clear to auscultation bilaterally Heart: regular rate and rhythm, S1, S2 normal, no murmur, click, rub or gallop Abdomen: soft, non-tender; bowel sounds normal; no masses,  no organomegaly Extremities: extremities normal, atraumatic, no cyanosis or edema Pulses: 2+ and symmetric  Assessment/Plan 71 year old female with PMHx of Diverticulitis s/p hemicolectomy who presents with bright red blood per rectum:  1) GI bleeding- New onset,  initially hypotensive in ER.  Has not had an episode since 6:45 am, hemoglobin at pt's baseline at 10.   - Will admit for observation and monitor vitals and hemoglobin - Hold aspirin - I have consulted [facility] (pt sees Dr. Matthias Hughs) for evaluation.  If bleeding resolves she may be appropriate for outpatient work up for source of bleeding.   2) Hypotension- improved in ER, will gently hydrate and monitor vitals.  Will hold all home BP medications for now.  3) Hypothyroidism- continue home synthroid.  4) FEN- NS @ 75 cc/hr for gentle hydration, NPO except ice chips for right now, monitor electrolytes.   5) DVT PPX- SCD's (no anticoagulation due to bleeding) 6) Disposition- pending clinical improvement, resolution of bleeding.   Chevonne Bostrom 02/28/2012, 11:49 AM Service Pager: [phone]

## 2012-02-28 NOTE — ED Provider Notes (Signed)
History     CSN: [id number]  Arrival date & time 02/28/12  0865   First MD Initiated Contact with Patient 02/28/12 0944      No chief complaint on file.   (Consider location/radiation/quality/duration/timing/severity/associated sxs/prior treatment) Patient is a 71 y.o. female presenting with hematochezia. The history is provided by the patient and a relative.  Rectal Bleeding   She has had 2 bowel movements today, both of which had large amounts of blood with clots. Blood is both bright red and dark red. She has felt dizzy and lightheaded and had a near syncopal episode following the second bowel movement. She denies chest pain, heaviness, tightness, pressure. She denies nausea or vomiting. She has had mild abdominal cramping just prior to each bowel movement, but no true abdominal pain. She does have a history of diverticulitis. She's not on any anticoagulants other than low-dose aspirin.  Past Medical History  Diagnosis Date  . Hypertension   . Diverticula, small intestine     No past surgical history on file.  No family history on file.  History  Substance Use Topics  . Smoking status: Former Games developer  . Smokeless tobacco: Never Used  . Alcohol Use: Yes    OB History    Grav Para Term Preterm Abortions TAB SAB Ect Mult Living                  Review of Systems  Gastrointestinal: Positive for hematochezia.  All other systems reviewed and are negative.    Allergies  Review of patient's allergies indicates no known allergies.  Home Medications   Current Outpatient Rx  Name Route Sig Dispense Refill  . ACETAMINOPHEN 500 MG PO TABS Oral Take 500 mg by mouth every 6 (six) hours as needed. For pain    . ASPIRIN 81 MG PO TBEC Oral Take 81 mg by mouth daily as needed. For pain/daily maintenance  When she remembers    . CALCIUM-VITAMIN D 500-200 MG-UNIT PO TABS Oral Take 1 tablet by mouth 2 (two) times daily with a meal. 60 tablet 11  . HYDROCHLOROTHIAZIDE 25 MG PO  TABS Oral Take 1 tablet (25 mg total) by mouth daily. 90 tablet 3  . LEVOTHYROXINE SODIUM 75 MCG PO TABS Oral Take 1 tablet (75 mcg total) by mouth daily. 90 tablet 1  . LISINOPRIL 40 MG PO TABS Oral Take 1 tablet (40 mg total) by mouth daily. 90 tablet 3  . PRAVASTATIN SODIUM 80 MG PO TABS Oral Take 1 tablet (80 mg total) by mouth daily. 90 tablet 1  . RANITIDINE HCL 150 MG PO TABS Oral Take 1 tablet (150 mg total) by mouth at bedtime. 90 tablet 3    BP 115/72  Pulse 65  Temp 98.3 F (36.8 C)  Resp 16  SpO2 100%  Physical Exam  Nursing note and vitals reviewed. 71 year old female, resting comfortably and in no acute distress. Vital signs are significant for hypertension with blood pressure 82/64. Oxygen saturation is 100%, which is normal. Head is normocephalic and atraumatic. PERRLA, EOMI. Oropharynx is clear. Neck is nontender and supple without adenopathy or JVD. Back is nontender and there is no CVA tenderness. Lungs are clear without rales, wheezes, or rhonchi. Chest is nontender. Heart has regular rate and rhythm without murmur. Abdomen is soft, flat, nontender without masses or hepatosplenomegaly and peristalsis is normoactive. Rectal: Normal sphincter tone, no masses, dark red blood with clots present. Extremities have no cyanosis or edema, full range of  motion is present. Skin is warm and dry without rash. Neurologic: Mental status is normal, cranial nerves are intact, there are no motor or sensory deficits.   ED Course  Procedures (including critical care time)  Results for orders placed during the hospital encounter of 02/28/12  CBC WITH DIFFERENTIAL      Component Value Range   WBC 4.5  4.0 - 10.5 K/uL   RBC 3.39 (*) 3.87 - 5.11 MIL/uL   Hemoglobin 10.0 (*) 12.0 - 15.0 g/dL   HCT 40.9 (*) 81.1 - 91.4 %   MCV 89.4  78.0 - 100.0 fL   MCH 29.5  26.0 - 34.0 pg   MCHC 33.0  30.0 - 36.0 g/dL   RDW 78.2  95.6 - 21.3 %   Platelets 269  150 - 400 K/uL   Neutrophils  Relative 65  43 - 77 %   Neutro Abs 2.9  1.7 - 7.7 K/uL   Lymphocytes Relative 26  12 - 46 %   Lymphs Abs 1.2  0.7 - 4.0 K/uL   Monocytes Relative 6  3 - 12 %   Monocytes Absolute 0.3  0.1 - 1.0 K/uL   Eosinophils Relative 2  0 - 5 %   Eosinophils Absolute 0.1  0.0 - 0.7 K/uL   Basophils Relative 1  0 - 1 %   Basophils Absolute 0.1  0.0 - 0.1 K/uL  COMPREHENSIVE METABOLIC PANEL      Component Value Range   Sodium 141  135 - 145 mEq/L   Potassium 4.4  3.5 - 5.1 mEq/L   Chloride 109  96 - 112 mEq/L   CO2 25  19 - 32 mEq/L   Glucose, Bld 96  70 - 99 mg/dL   BUN 20  6 - 23 mg/dL   Creatinine, Ser 0.86  0.50 - 1.10 mg/dL   Calcium 9.3  8.4 - 57.8 mg/dL   Total Protein 6.7  6.0 - 8.3 g/dL   Albumin 3.6  3.5 - 5.2 g/dL   AST 14  0 - 37 U/L   ALT 7  0 - 35 U/L   Alkaline Phosphatase 31 (*) 39 - 117 U/L   Total Bilirubin 0.5  0.3 - 1.2 mg/dL   GFR calc non Af Amer 49 (*) >90 mL/min   GFR calc Af Amer 56 (*) >90 mL/min  APTT      Component Value Range   aPTT 21 (*) 24 - 37 seconds  PROTIME-INR      Component Value Range   Prothrombin Time 12.8  11.6 - 15.2 seconds   INR 0.97  0.00 - 1.49  TYPE AND SCREEN      Component Value Range   ABO/RH(D) AB POS     Antibody Screen NEG     Sample Expiration 03/02/2012    LACTIC ACID, PLASMA      Component Value Range   Lactic Acid, Venous 1.5  0.5 - 2.2 mmol/L  OCCULT BLOOD, POC DEVICE      Component Value Range   Fecal Occult Bld POSITIVE    ABO/RH      Component Value Range   ABO/RH(D) AB POS      Date: 02/28/2012  Rate: 69  Rhythm: normal sinus rhythm  QRS Axis: normal  Intervals: normal  ST/T Wave abnormalities: normal  Conduction Disutrbances:none  Narrative Interpretation: Normal ECG. When compared with ECG of 05/03/2006, no significant changes are seen.  Old EKG Reviewed: unchanged    1. GERD (  gastroesophageal reflux disease)   2. Rectal bleeding     CRITICAL CARE Performed by: ZOXWR,UEAVW   Total critical care  time: 35 minutes  Critical care time was exclusive of separately billable procedures and treating other patients.  Critical care was necessary to treat or prevent imminent or life-threatening deterioration.  Critical care was time spent personally by me on the following activities: development of treatment plan with patient and/or surrogate as well as nursing, discussions with consultants, evaluation of patient's response to treatment, examination of patient, obtaining history from patient or surrogate, ordering and performing treatments and interventions, ordering and review of laboratory studies, ordering and review of radiographic studies, pulse oximetry and re-evaluation of patient's condition.   MDM  Rectal bleeding which is most likely due to diverticulosis. Blood pressure was repeated upon arrival in her room and has come up to 115 systolic. She will be given IV fluid bolus and laboratory workup initiated including a type and screen. Case is been discussed with Dr. Lula Olszewski of family practice Center who agrees to admit the patient.  Blood pressure has remained stable above 110. Electrolytes are normal. Patient has been seen by family practice resident and admission orders have been  entered.      Dione Booze, MD 02/28/12 [phone]

## 2012-02-28 NOTE — Consult Note (Signed)
Subjective:   HPI  The patient is a 71 year old female who we are asked to see in regards to gastrointestinal bleeding. She has a history of diverticulosis. She has also had a hemicolectomy in the past with an ostomy which was reversed apparently for diverticulitis. This was all back in 2001. She had an episode of rectal bleeding around 5:30 this morning and another one at 645 this morning. She described the blood as red and maroon in color. She has no complaints of abdominal pain. She denies hematemesis.  Review of Systems No chest pain or dyspnea  Past Medical History  Diagnosis Date  . Hypertension   . Diverticula, small intestine   . Hyperlipidemia    Past Surgical History  Procedure Date  . Colon surgery 09/22/2000  . Appendectomy 09/22/2000   History   Social History  . Marital Status: Widowed    Spouse Name: N/A    Number of Children: N/A  . Years of Education: N/A   Occupational History  . Not on file.   Social History Main Topics  . Smoking status: Former Games developer  . Smokeless tobacco: Never Used  . Alcohol Use: Yes  . Drug Use: Not on file  . Sexually Active: Not on file   Other Topics Concern  . Not on file   Social History Narrative  . No narrative on file   family history is not on file. Current facility-administered medications:0.9 %  sodium chloride infusion, , Intravenous, Once, Dione Booze, MD;  0.9 %  sodium chloride infusion, , Intravenous, Continuous, Ardyth Gal, MD;  acetaminophen (TYLENOL) suppository 650 mg, 650 mg, Rectal, Q6H PRN, Ardyth Gal, MD;  acetaminophen (TYLENOL) tablet 650 mg, 650 mg, Oral, Q6H PRN, Ardyth Gal, MD famotidine (PEPCID) tablet 20 mg, 20 mg, Oral, QHS, Ardyth Gal, MD;  levothyroxine (SYNTHROID, LEVOTHROID) tablet 75 mcg, 75 mcg, Oral, Daily, Ardyth Gal, MD;  ondansetron [facility]) injection 4 mg, 4 mg, Intravenous, Q6H PRN, Ardyth Gal, MD;  ondansetron [facility]) tablet 4 mg, 4 mg,  Oral, Q6H PRN, Ardyth Gal, MD sodium chloride 0.9 % bolus 1,000 mL, 1,000 mL, Intravenous, Once, Dione Booze, MD, 1,000 mL at 02/28/12 1143;  sodium chloride 0.9 % injection 3 mL, 3 mL, Intravenous, Q12H, Ardyth Gal, MD No Known Allergies   Objective:     BP 128/78  Pulse 60  Temp 98.3 F (36.8 C)  Resp 12  SpO2 100%  She is in no distress  Heart regular rhythm no murmurs  Lungs clear  Abdomen is soft and nontender  Laboratory No components found with this basename: d1      Assessment:     Lower gastrointestinal bleeding probably a diverticular origin. She appears clinically stable at this time.      Plan:     We will observe her tonight and watch for any signs of further bleeding. Consideration of inpatient versus outpatient colonoscopy will be made tomorrow.    Component Value Date/Time   WBC 4.5 02/28/2012 1014   HGB 10.0* 02/28/2012 1014   HCT 30.3* 02/28/2012 1014   PLT 269 02/28/2012 1014   ALT 7 02/28/2012 1014   AST 14 02/28/2012 1014   NA 141 02/28/2012 1014   K 4.4 02/28/2012 1014   CL 109 02/28/2012 1014   CREATININE 1.10 02/28/2012 1014   CREATININE 1.09 08/29/2011 1001   BUN 20 02/28/2012 1014   CO2 25 02/28/2012 1014   CALCIUM 9.3 02/28/2012 1014   ALKPHOS 31* 02/28/2012 1014

## 2012-02-28 NOTE — ED Notes (Signed)
States had bm this am and had blood in it now has gotten worse more blood and clots  Has hx of diverticultis

## 2012-02-28 NOTE — Progress Notes (Signed)
1430 Patient arrived to floor from ED. Placed on telemetry. Son at bedside. Reviewed safety video

## 2012-02-28 NOTE — ED Notes (Signed)
Pt undressed, in gown, on monitor, continuous pulse oximetry and blood pressure cuff; EKG performed 

## 2012-02-28 NOTE — H&P (Signed)
FMTS Attending Admit Note Patient seen and examined by me, discussed with admitting resident Dr Lula Olszewski and I agree with her assessment and plan.  Patient is comfortable and in no distress at the time of my exam in the ED.  She reports that she had felt dizzy and lightheaded following the second episode of blood per rectum (around 0645am) this morning.  She denies nausea or vomiting; says she has not had abdominal pain.  Does not take OTC medications or NSAIDs; only the meds prescribed to her by Dr Armen Pickup.   Assess/Plan: Patient admitted for acute lower GI bleed, appears hemodynamically stable at this time;  Her initial Hgb done in the ED 3-4 hours after the episode is 10.  Plan for serial H/H and repeat exams; GI consult to decide whether she requires colonoscopy as inpatient.  [name], MD

## 2012-02-28 NOTE — ED Notes (Signed)
Reports has had 2 bloody BM's onset this morning. Second much bloodier than first. Denies abd pain, n/v/d.

## 2012-02-29 DIAGNOSIS — Z8719 Personal history of other diseases of the digestive system: Secondary | ICD-10-CM

## 2012-02-29 HISTORY — DX: Personal history of other diseases of the digestive system: Z87.19

## 2012-02-29 LAB — CBC
HCT: 23 % — ABNORMAL LOW (ref 36.0–46.0)
HCT: 22.3 % — ABNORMAL LOW (ref 36.0–46.0)
Hemoglobin: 7.6 g/dL — ABNORMAL LOW (ref 12.0–15.0)
Hemoglobin: 7.7 g/dL — ABNORMAL LOW (ref 12.0–15.0)
Hemoglobin: 6.8 g/dL — CL (ref 12.0–15.0)
MCH: 31.4 pg (ref 26.0–34.0)
MCV: 90.2 fL (ref 78.0–100.0)
MCV: 91 fL (ref 78.0–100.0)
Platelets: 235 10*3/uL (ref 150–400)
RBC: 2.45 MIL/uL — ABNORMAL LOW (ref 3.87–5.11)
RBC: 2.31 MIL/uL — ABNORMAL LOW (ref 3.87–5.11)
WBC: 4.4 10*3/uL (ref 4.0–10.5)

## 2012-02-29 LAB — MRSA PCR SCREENING: MRSA by PCR: NEGATIVE

## 2012-02-29 LAB — PREPARE RBC (CROSSMATCH)

## 2012-02-29 LAB — BASIC METABOLIC PANEL
BUN: 17 mg/dL (ref 6–23)
CO2: 26 mEq/L (ref 19–32)
Chloride: 112 mEq/L (ref 96–112)
GFR calc Af Amer: 60 mL/min — ABNORMAL LOW (ref >90)
Potassium: 4 mEq/L (ref 3.5–5.1)

## 2012-02-29 MED ORDER — DIPHENHYDRAMINE HCL 25 MG PO CAPS
25.0000 mg | ORAL_CAPSULE | Freq: Once | ORAL | Status: AC
  Administered 2012-02-29: 25 mg via ORAL
  Filled 2012-02-29: qty 1

## 2012-02-29 MED ORDER — SODIUM CHLORIDE 0.9 % IV SOLN: INTRAVENOUS | Status: DC

## 2012-02-29 MED ORDER — PEG 3350-KCL-NA BICARB-NACL 420 G PO SOLR
4000.0000 mL | Freq: Once | ORAL | Status: AC
  Administered 2012-02-29: 4000 mL via ORAL
  Filled 2012-02-29: qty 4000

## 2012-02-29 NOTE — Progress Notes (Signed)
Pt to be transferred to unit 2900. Report called to nurse Belinda. Pt to be transferred with all of her belongs. VS are stable . Family at bedside. Pt's colonoscopy prep was sent along with her and explained to RN that pt needed to continue to drink prep. Aggie Cosier, RN

## 2012-02-29 NOTE — Progress Notes (Signed)
Pt to be transferred to unit 2900. Report called to nurse Belinda. Pt to be transferred with all of her belongs. VS are stable . Family at bedside.

## 2012-02-29 NOTE — Progress Notes (Signed)
FMTS Attending Note Patient seen and examined by me, discussed with resident team and I agree with Dr. Paulina Fusi' note for today.  Patient reports that she is not in pain, she feels relatively well. No further BMs since the bleeding episode that preceded admission.  She has not passed flatus. Hgb drop noted; has remained stable since yesterday evening.  Plan to continue to monitor her Hgb and her vital signs.  I see that GI service is planning a colonoscopy for her tomorrow.  [name], MD

## 2012-02-29 NOTE — Progress Notes (Signed)
No reports of further active bleeding. Hemoglobin has dropped to 7.7.  She is in no distress  Abdomen is soft and nontender  Impression lower GI bleeding  Plan: Proceed with colonoscopy tomorrow

## 2012-02-29 NOTE — Progress Notes (Signed)
1925 Patient had bloody stool x2. Alert and oriented x4. Patient started having tremors . B/P 157/84 P103  Resp. 18 temp 97.5. Dr Sondra Come made aware . 1940 Dr. Sondra Come at bedside to assess patient. Son at bedside.

## 2012-02-29 NOTE — Progress Notes (Signed)
Spoke to my attending physician.  Agreed to transfuse 2 PRBC now and monitor in Step Down.  Spoke to [facility] physician on call who will make Dr. Leanna Sato aware of recent drop in Hemoglobin and bloody stools after bowel prep.

## 2012-02-29 NOTE — Significant Event (Signed)
RN called to inform me that patient was complaining of tremors and RT shoulder/arm pain.  She has had shoulder pain in the past, but this more severe.  She denies any CP or SOB at this time.  Tremors resolved by the time I examined patient.  On physical exam, patient able to abduct and adduct RT upper extremity.  4/5 strength bilateral upper extremities.  Mild tenderness on palpation of RT deltoid, no skin changes or erythema.    RN also informed me that she has had 2 loose bloody stools status colonoscopy prep.   Plan: - Tylenol as needed for shoulder pain, likely MSK related - CBC STAT - Type and Screen - Transfer to Step Down Unit now since patient continues to have bloody stools and for closer monitoring  DE LA CRUZ,Kalinda Romaniello

## 2012-02-29 NOTE — Progress Notes (Signed)
CRITICAL VALUE ALERT  Critical value received: hgb 6.8 Date of notification: 02/29/12  Time of notification:  2130  Critical value read back: yes  Nurse who received alert: B. Evonnie Dawes  MD notified (1st page): Dr. Mauricio Po Time of first page:  2135  MD notified (2nd page):  Time of second page:  Responding MD:  Dr. Mauricio Po Time MD responded: 2149

## 2012-02-29 NOTE — Discharge Summary (Signed)
Physician Discharge Summary  Patient ID: Beuna Kennard MRN: [id number] DOB: 13-May-1941 Age: 71 y.o.  Admit date: 02/28/2012 Discharge date: 03/07/2012 Admitting Physician: Barbaraann Barthel, MD  PCP: Dessa Phi, MD  Consultants: Gastroenterology, Interventional Radiology, General Surgery, Critical Care Medicine      Discharge Diagnosis: Principal Problem:  *Acute blood loss anemia Active Problems:  HYPOTHYROIDISM, UNSPECIFIED  HYPERTENSION, BENIGN SYSTEMIC  GERD  GI bleed  Hypotension due to blood loss    Hospital Course 71 year old female with PMHx of Diverticulitis s/p hemicolectomy who presents with bright red blood per rectum.  1) Lower GI Bleed - Pt presented to the ED with hypotension and reported two episodes of bloody stool.  Her ASA was initially held upon presentation. She had a CBC performed in the ED showing a Hgb of 10, CMP (WNL), type and screen, FOBT which was positive, EKG which was NSR and was admitted to the floor for observation.  Her Hgb was monitored daily at first and her repeat CBC showed a Hgb of 7.7.  Repeat H & H were ordered q 4 hrs and her Hgb dropped to 6.1.  At this point, she was type and crossed and received 2 U pRBC, which brought her Hgb back to 9.5.   GI was consulted and they decided to perform a colonoscopy on her, which revealed multiple diverticula.  They could not find the source of bleed and recommended IR to perform a Tech-99 scan.  The scan revealed possible ascending colon bleed and embolization was performed to try and eliminate the source.  Pt clinically remained stable up to this point, as she was not tachycardic, BP remained above 100 systolic, no abdominal tenderness.  Her Hgb continued to be monitored and it dropped again below 7 after the embolization procedure, in which she received 2 more U pRBC's.  However, she became slightly agitated, tachycardic, and her BP dropped to around 80 systolic during the transfusion.  Due to her previous  unsuccessful embolization, general surgery was consulted and decided she would need a total colectomy if she was taken to the OR.  Upon discussion, it was decided if the pt continued to bleed, she would need a repeat embolization procedure per IR.  Also, when the pt became hemodynamically unstable, she was transferred to the ICU for closer management by CCM.  She was initially given 2 U FFP and 1 U of platelets upon arrival to the ICU and her coag studies were monitored closely.  She had serial CBC's done while in the ICU and was closely monitored.  Her Hgb remained around 9 and she was transferred back to the SDU.  She had two bowel movements without blood before d/c and her Hgb remained around 8.5 before d/c x 4 checks.   2) Hypotension - Pt was initially hypotensive upon admission to the hospital.  Her BP medications were held due to this reason.  She was given a 1L NS bolus and maintained on fluids during the beginning of her hospital stay.  She remained stable despite active GI bleed.  However, after transfusion of the second set of her 2 U of pRBC, pt did become tachycardic and hypotensive.  She was given a 500 cc NS bolus and transferred to the ICU for closer management.  She received a right IJ central cath for large bore access and possible fluid administration. Her BP responded and remained above 120 systolic over the remaineder of her stay.   3. Hypothyroidism - Stable, continued home  synthroid         Discharge PE   Filed Vitals:   03/07/12 1600  BP: 147/84  Pulse: 89  Temp: 98.4 F (36.9 C)  Resp: 18   General appearance: elderly female in NAD, very pleasant  Lungs: CTAB, no wheezes, normal work of breathing  Heart: RRR, S1, S2 normal, no murmur appreciated  Abdomen: soft, non-tender to palpation, not distended on inspection; BS+  Extremities: warm, well-perfused, no calf swelling or tenderness, distal pulses intact/symmetric   Labs/Imaging  CBC  Lab 03/07/12 0430 03/06/12 2000  03/06/12 1015  WBC 5.2 5.9 6.3  HGB 8.6* 8.3* 8.7*  HCT 25.7* 25.3* 26.3*  PLT 188 176 159   BMET  Lab 03/06/12 0256 03/05/12 0500 03/04/12 0305 03/03/12 1315  NA 140 137 140 --  K 4.3 3.6 3.6 --  CL 111 110 112 --  CO2 24 23 22  --  BUN 16 14 22  --  CREATININE 1.19* 1.09 1.25* --  CALCIUM 8.3* 8.1* 8.0* --  PROT -- -- -- 4.1*  BILITOT -- -- -- 0.4  ALKPHOS -- -- -- 28*  ALT -- -- -- 10  AST -- -- -- 20  GLUCOSE 99 100* 80 --   NM GI angiogram 03/01/12 IMPRESSION:  Evidence of active bleeding centered in the right side of the  abdomen. Favored to be within the ascending colon with minimal  retrograde movement into the cecum and antegrade movement into the  transverse colon. A distal small bowel source could look similar.  Colonoscopy 03/01/12 Greater than 100 diverticula present diffusely through the colon   Patient condition at time of discharge/disposition: stable  Disposition-home   Follow up issues: 1. GI bleed - Consider recheck CBC.  Also may need to f/u with surgery in outpatient setting.    Discharge follow up:    Discharge Orders    Future Appointments: Provider: Department: Dept Phone: Center:   03/09/2012 10:30 AM Dessa Phi, MD [facility] [phone] [facility]     Future Orders Please Complete By Expires   Discharge instructions      Comments:   If you develop bleeding from rectum or bloody stools, please see your doctor or return to ER. If you develop chest pain, shortness of breath, abdominal pain, or worsening fatigue, please see your doctor or return to ER. Stop taking Aspirin for now until you see your primary care doctor. Avoid any NSAID medications (like Aleve, Motrin/Ibuprofen, Aspirin) - this can worsen gastrointestinal bleeding. Increase FIBER in your diet and drink plenty of fluids. You have a follow up appointment scheduled with Dr. Armen Pickup - 03/09/12 @ 10:30 AM.   Discharge patient           Discharge Instructions:  Please refer to Patient Instructions section of EMR for full details.  Patient was counseled important signs and symptoms that should prompt return to medical care, changes in medications, dietary instructions, activity restrictions, and follow up appointments.  Significant instructions noted below:    Discharge Medications   Medication List     As of 03/07/2012  9:03 PM    START taking these medications         famotidine 20 MG tablet   Commonly known as: PEPCID   Take 1 tablet (20 mg total) by mouth 2 (two) times daily.      CONTINUE taking these medications         acetaminophen 500 MG tablet   Commonly known as: TYLENOL  calcium-vitamin D 500-200 MG-UNIT per tablet   Take 1 tablet by mouth 2 (two) times daily with a meal.      hydrochlorothiazide 25 MG tablet   Commonly known as: HYDRODIURIL   Take 1 tablet (25 mg total) by mouth daily.      levothyroxine 75 MCG tablet   Commonly known as: SYNTHROID, LEVOTHROID   Take 1 tablet (75 mcg total) by mouth daily.      lisinopril 40 MG tablet   Commonly known as: PRINIVIL,ZESTRIL   Take 1 tablet (40 mg total) by mouth daily.      pravastatin 80 MG tablet   Commonly known as: PRAVACHOL   Take 1 tablet (80 mg total) by mouth daily.      ranitidine 150 MG tablet   Commonly known as: ZANTAC   Take 1 tablet (150 mg total) by mouth at bedtime.      STOP taking these medications         aspirin 81 MG EC tablet          Where to get your medications    These are the prescriptions that you need to pick up. We sent them to a specific pharmacy, so you will need to go there to get them.   [id number] - [place] ([place]), [place] - [address]    [address] [place] ([place]) [place] [address]    Phone: [phone]        famotidine 20 MG tablet                Gildardo Cranker, DO of [facility] 03/07/2012 9:03 PM

## 2012-02-29 NOTE — Progress Notes (Signed)
PGY-1 Daily Progress Note Family Medicine Teaching Service [place] R. Laila Myhre, DO Service Pager: [phone]   Subjective: Pt did well overnight.  She feels good right now and not having SOB, CP, increased fatigue, BRBPR, melena, or abdominal pain.   Objective:  VITALS Temp:  [97.9 F (36.6 C)-99 F (37.2 C)] 97.9 F (36.6 C) (10/16 1355) Pulse Rate:  [62-79] 79  (10/16 1355) Resp:  [16-20] 16  (10/16 1355) BP: (104-124)/(66-79) 124/74 mmHg (10/16 1355) SpO2:  [100 %] 100 % (10/16 1355) Weight:  [111 lb 15.9 oz (50.8 kg)-117 lb 8.1 oz (53.3 kg)] 117 lb 8.1 oz (53.3 kg) (10/16 0544)  In/Out  Intake/Output Summary (Last 24 hours) at 02/29/12 1435 Last data filed at 02/29/12 1415  Gross per 24 hour  Intake   1990 ml  Output    353 ml  Net   1637 ml    Physical Exam: Physical Exam  BP 115/72  Pulse 65  Temp 98.3 F (36.8 C)  Resp 16  SpO2 100%  General appearance: alert, cooperative and no distress  Neck: no JVD and supple, symmetrical, trachea midline  Lungs: clear to auscultation bilaterally  Heart: regular rate and rhythm, S1, S2 normal, no murmur, click, rub or gallop  Abdomen: soft, non-tender; bowel sounds normal; no masses, no organomegaly  Extremities: extremities normal, atraumatic, no cyanosis or edema  Pulses: 2+ and symmetric   MEDS Scheduled Meds:    . sodium chloride   Intravenous Once  . famotidine  20 mg Oral QHS  . levothyroxine  75 mcg Oral Daily  . sodium chloride  3 mL Intravenous Q12H   Continuous Infusions:    . sodium chloride 75 mL/hr at 02/29/12 0500   PRN Meds:.acetaminophen, acetaminophen, ondansetron (ZOFRAN) IV, ondansetron  Labs and imaging:   CBC  Lab 02/29/12 1325 02/29/12 0645 02/28/12 1816  WBC 4.1 4.4 5.9  HGB 7.7* 7.6* 7.9*  HCT 22.3* 23.0* 23.8*  PLT 231 237 246   BMET/CMET  Lab 02/29/12 0645 02/28/12 1014  NA 141 141  K 4.0 4.4  CL 112 109  CO2 26 25  BUN 17 20  CREATININE 1.05 1.10  CALCIUM 8.5 9.3  PROT --  6.7  BILITOT -- 0.5  ALKPHOS -- 31*  ALT -- 7  AST -- 14  GLUCOSE 88 96   Results for orders placed during the hospital encounter of 02/28/12 (from the past 24 hour(s))  CBC     Status: Abnormal   Collection Time   02/28/12  6:16 PM      Component Value Range   WBC 5.9  4.0 - 10.5 K/uL   RBC 2.65 (*) 3.87 - 5.11 MIL/uL   Hemoglobin 7.9 (*) 12.0 - 15.0 g/dL   HCT 45.4 (*) 09.8 - 11.9 %   MCV 89.8  78.0 - 100.0 fL   MCH 29.8  26.0 - 34.0 pg   MCHC 33.2  30.0 - 36.0 g/dL   RDW 14.7  82.9 - 56.2 %   Platelets 246  150 - 400 K/uL  BASIC METABOLIC PANEL     Status: Abnormal   Collection Time   02/29/12  6:45 AM      Component Value Range   Sodium 141  135 - 145 mEq/L   Potassium 4.0  3.5 - 5.1 mEq/L   Chloride 112  96 - 112 mEq/L   CO2 26  19 - 32 mEq/L   Glucose, Bld 88  70 - 99 mg/dL  BUN 17  6 - 23 mg/dL   Creatinine, Ser 1.61  0.50 - 1.10 mg/dL   Calcium 8.5  8.4 - 09.6 mg/dL   GFR calc non Af Amer 51 (*) >90 mL/min   GFR calc Af Amer 60 (*) >90 mL/min  CBC     Status: Abnormal   Collection Time   02/29/12  6:45 AM      Component Value Range   WBC 4.4  4.0 - 10.5 K/uL   RBC 2.55 (*) 3.87 - 5.11 MIL/uL   Hemoglobin 7.6 (*) 12.0 - 15.0 g/dL   HCT 04.5 (*) 40.9 - 81.1 %   MCV 90.2  78.0 - 100.0 fL   MCH 29.8  26.0 - 34.0 pg   MCHC 33.0  30.0 - 36.0 g/dL   RDW 91.4  78.2 - 95.6 %   Platelets 237  150 - 400 K/uL  CBC     Status: Abnormal   Collection Time   02/29/12  1:25 PM      Component Value Range   WBC 4.1  4.0 - 10.5 K/uL   RBC 2.45 (*) 3.87 - 5.11 MIL/uL   Hemoglobin 7.7 (*) 12.0 - 15.0 g/dL   HCT 21.3 (*) 08.6 - 57.8 %   MCV 91.0  78.0 - 100.0 fL   MCH 31.4  26.0 - 34.0 pg   MCHC 34.5  30.0 - 36.0 g/dL   RDW 46.9  62.9 - 52.8 %   Platelets 231  150 - 400 K/uL       Assessment  71 year old female with PMHx of Diverticulitis s/p hemicolectomy who presents with bright red blood per rectum:   1) GI bleeding- Pt reported 2 episodes of BRBPR yesterday  before evaluation in the ED.  1) CBC on admission was 10.0.  This AM was 7.6 and are not trending q 4 hrs.  Most recent was 7.7.   2) Pt clinically is comfortable and her vitals are stable.  No further management at this time.  Will continue to trend  3) Following GI recommendations on plan for colonoscopy inpt vs outpt.  At this point looks like they will prep patient today and take for colonoscopy tomorrow.  Could be from diverticular disease but will await results of exam.   4) Will consider Type and Screen due to acute drop in Hgb. However, she is receiving fluids at 75cc/hr and could in part be due to hemodilution.  5) Continue holding ASA.    6 ) Zofran for nausea and Pepcid for GI PPx.  2) Hypotension-Resolved.  1) Continue holding home BP meds.   3) Hypothyroidism- continue home synthroid.   4) FEN- NS @ 75 cc/hr for gentle hydration, NPO except ice chips for right now, monitor electrolytes.  5) DVT PPX- SCD's (no anticoagulation due to bleeding)  6) Disposition- pending clinical improvement, resolution of bleeding and GI recs.   Code Status: Full   Loki Wuthrich R. Paulina Fusi, DO of [facility] 02/29/2012, 2:35 PM

## 2012-03-01 ENCOUNTER — Observation Stay (HOSPITAL_COMMUNITY): Payer: Medicare Other

## 2012-03-01 ENCOUNTER — Encounter (HOSPITAL_COMMUNITY)
Admission: EM | Disposition: A | Discharge status: Home / Self Care | Payer: Self-pay | Source: Home / Self Care | Attending: Family Medicine

## 2012-03-01 ENCOUNTER — Encounter (HOSPITAL_COMMUNITY): Payer: Self-pay | Admitting: *Deleted

## 2012-03-01 HISTORY — PX: COLONOSCOPY: SHX5424

## 2012-03-01 LAB — CBC
Hemoglobin: 9.5 g/dL — ABNORMAL LOW (ref 12.0–15.0)
MCH: 30.2 pg (ref 26.0–34.0)
MCHC: 34.1 g/dL (ref 30.0–36.0)
MCV: 87 fL (ref 78.0–100.0)
Platelets: 157 10*3/uL (ref 150–400)
Platelets: 114 10*3/uL — ABNORMAL LOW (ref 150–400)
RBC: 3.15 MIL/uL — ABNORMAL LOW (ref 3.87–5.11)
RDW: 15.6 % — ABNORMAL HIGH (ref 11.5–15.5)
WBC: 5.4 10*3/uL (ref 4.0–10.5)

## 2012-03-01 LAB — BASIC METABOLIC PANEL
Calcium: 7.5 mg/dL — ABNORMAL LOW (ref 8.4–10.5)
Chloride: 114 mEq/L — ABNORMAL HIGH (ref 96–112)
Creatinine, Ser: 1.03 mg/dL (ref 0.50–1.10)
GFR calc non Af Amer: 53 mL/min — ABNORMAL LOW (ref >90)

## 2012-03-01 LAB — PREPARE RBC (CROSSMATCH)

## 2012-03-01 SURGERY — COLONOSCOPY
Anesthesia: Moderate Sedation

## 2012-03-01 MED ORDER — TECHNETIUM TC 99M-LABELED RED BLOOD CELLS IV KIT
25.0000 | PACK | Freq: Once | INTRAVENOUS | Status: AC | PRN
  Administered 2012-03-01: 25 via INTRAVENOUS

## 2012-03-01 MED ORDER — CEFAZOLIN SODIUM 1-5 GM-% IV SOLN
1.0000 g | Freq: Once | INTRAVENOUS | Status: AC
  Administered 2012-03-01: 1 g via INTRAVENOUS

## 2012-03-01 MED ORDER — IOHEXOL 300 MG/ML  SOLN
150.0000 mL | Freq: Once | INTRAMUSCULAR | Status: AC | PRN
  Administered 2012-03-01: 200 mL via INTRA_ARTERIAL

## 2012-03-01 MED ORDER — MIDAZOLAM HCL 5 MG/5ML IJ SOLN
INTRAMUSCULAR | Status: DC | PRN
  Administered 2012-03-01 (×3): 1 mg via INTRAVENOUS

## 2012-03-01 MED ORDER — SODIUM CHLORIDE 0.9 % IV SOLN
INTRAVENOUS | Status: DC
  Administered 2012-03-01: 12:00:00 via INTRAVENOUS

## 2012-03-01 MED ORDER — FENTANYL CITRATE 0.05 MG/ML IJ SOLN
INTRAMUSCULAR | Status: AC | PRN
  Administered 2012-03-01: 25 ug via INTRAVENOUS
  Administered 2012-03-01: 50 ug via INTRAVENOUS

## 2012-03-01 MED ORDER — MIDAZOLAM HCL 5 MG/ML IJ SOLN
INTRAMUSCULAR | Status: AC
  Filled 2012-03-01: qty 1

## 2012-03-01 MED ORDER — FENTANYL CITRATE 0.05 MG/ML IJ SOLN
INTRAMUSCULAR | Status: DC | PRN
  Administered 2012-03-01 (×3): 12.5 ug via INTRAVENOUS

## 2012-03-01 MED ORDER — FENTANYL CITRATE 0.05 MG/ML IJ SOLN
INTRAMUSCULAR | Status: AC
  Filled 2012-03-01: qty 2

## 2012-03-01 MED ORDER — MIDAZOLAM HCL 2 MG/2ML IJ SOLN
INTRAMUSCULAR | Status: AC | PRN
  Administered 2012-03-01 (×2): 1 mg via INTRAVENOUS

## 2012-03-01 NOTE — ED Notes (Signed)
 NTG given by DR. Hoss during procedure at 2215

## 2012-03-01 NOTE — Progress Notes (Signed)
CRITICAL VALUE ALERT  Critical value received:  hgb 6.1 Date of notification: 03/01/12  Time of notification:  2155 Critical value read back: yes  Nurse who received alert: b. Evonnie Dawes MD notified (1st page):  Dr.Breen Time of first page:  2200   MD notified (2nd page): Family Practice  Time of second page: 2215 Responding MD:  Dr. Durene Cal Time MD responded:  2220

## 2012-03-01 NOTE — Progress Notes (Signed)
Pt has orders for off tele for procedures; pt taken by 2 techs down for her colonoscopy.  Emotional support given

## 2012-03-01 NOTE — Progress Notes (Signed)
PGY-1 Daily Progress Note Family Medicine Teaching Service Beth R. Beth Penix, DO Service Pager: [phone]   Subjective: Pt did have multiple episodes of BRBPR last night and required 2 u PRBC.  Is currently stable and feeling well.  Ready for colonoscopy   Objective:  VITALS Temp:  [97.9 F (36.6 C)-98.7 F (37.1 C)] 98.6 F (37 C) (10/17 0800) Pulse Rate:  [63-83] 63  (10/17 0800) Resp:  [16-20] 20  (10/17 0145) BP: (105-150)/(50-75) 120/66 mmHg (10/17 0800) SpO2:  [96 %-100 %] 100 % (10/17 0800) Weight:  [114 lb 6.7 oz (51.9 kg)-118 lb 13.3 oz (53.9 kg)] 114 lb 6.7 oz (51.9 kg) (10/17 0500)  In/Out  Intake/Output Summary (Last 24 hours) at 03/01/12 0947 Last data filed at 03/01/12 4540  Gross per 24 hour  Intake 2188.75 ml  Output     18 ml  Net 2170.75 ml    Physical Exam: Physical Exam  BP 115/72  Pulse 65  Temp 98.3 F (36.8 C)  Resp 16  SpO2 100%  General appearance: alert, cooperative and no distress  Neck: no JVD and supple, symmetrical, trachea midline  Lungs: clear to auscultation bilaterally  Heart: regular rate and rhythm, S1, S2 normal, no murmur, click, rub or gallop  Abdomen: soft, non-tender; bowel sounds normal; no masses, no organomegaly  Extremities: extremities normal, atraumatic, no cyanosis or edema  Pulses: 2+ and symmetric   MEDS Scheduled Meds:    . sodium chloride   Intravenous Once  . diphenhydrAMINE  25 mg Oral Once  . famotidine  20 mg Oral QHS  . levothyroxine  75 mcg Oral Daily  . polyethylene glycol-electrolytes  4,000 mL Oral Once  . sodium chloride  3 mL Intravenous Q12H   Continuous Infusions:    . sodium chloride 100 mL/hr (03/01/12 0836)  . DISCONTD: sodium chloride 20 mL (02/29/12 1747)   PRN Meds:.acetaminophen, acetaminophen, ondansetron (ZOFRAN) IV, ondansetron  Labs and imaging:   CBC  Lab 03/01/12 0645 02/29/12 2053 02/29/12 1325  WBC 4.8 4.3 4.1  HGB 9.5* 6.8* 7.7*  HCT 27.9* 20.6* 22.3*  PLT 157 235 231     BMET/CMET  Lab 02/29/12 0645 02/28/12 1014  NA 141 141  K 4.0 4.4  CL 112 109  CO2 26 25  BUN 17 20  CREATININE 1.05 1.10  CALCIUM 8.5 9.3  PROT -- 6.7  BILITOT -- 0.5  ALKPHOS -- 31*  ALT -- 7  AST -- 14  GLUCOSE 88 96   Results for orders placed during the hospital encounter of 02/28/12 (from the past 24 hour(s))  CBC     Status: Abnormal   Collection Time   02/29/12  1:25 PM      Component Value Range   WBC 4.1  4.0 - 10.5 K/uL   RBC 2.45 (*) 3.87 - 5.11 MIL/uL   Hemoglobin 7.7 (*) 12.0 - 15.0 g/dL   HCT 98.1 (*) 19.1 - 47.8 %   MCV 91.0  78.0 - 100.0 fL   MCH 31.4  26.0 - 34.0 pg   MCHC 34.5  30.0 - 36.0 g/dL   RDW 29.5  62.1 - 30.8 %   Platelets 231  150 - 400 K/uL  CBC     Status: Abnormal   Collection Time   02/29/12  8:53 PM      Component Value Range   WBC 4.3  4.0 - 10.5 K/uL   RBC 2.31 (*) 3.87 - 5.11 MIL/uL   Hemoglobin 6.8 (*)  12.0 - 15.0 g/dL   HCT 16.1 (*) 09.6 - 04.5 %   MCV 89.2  78.0 - 100.0 fL   MCH 29.4  26.0 - 34.0 pg   MCHC 33.0  30.0 - 36.0 g/dL   RDW 40.9  81.1 - 91.4 %   Platelets 235  150 - 400 K/uL  MRSA PCR SCREENING     Status: Normal   Collection Time   02/29/12  9:23 PM      Component Value Range   MRSA by PCR NEGATIVE  NEGATIVE  PREPARE RBC (CROSSMATCH)     Status: Normal   Collection Time   02/29/12  9:46 PM      Component Value Range   Order Confirmation ORDER PROCESSED BY BLOOD BANK    CBC     Status: Abnormal   Collection Time   03/01/12  6:45 AM      Component Value Range   WBC 4.8  4.0 - 10.5 K/uL   RBC 3.15 (*) 3.87 - 5.11 MIL/uL   Hemoglobin 9.5 (*) 12.0 - 15.0 g/dL   HCT 78.2 (*) 95.6 - 21.3 %   MCV 88.6  78.0 - 100.0 fL   MCH 30.2  26.0 - 34.0 pg   MCHC 34.1  30.0 - 36.0 g/dL   RDW 08.6  57.8 - 46.9 %   Platelets 157  150 - 400 K/uL       Assessment  71 year old female with PMHx of Diverticulitis s/p hemicolectomy who presents with bright red blood per rectum:   1) GI bleeding- Pt reported 5  episodes of BRPBR last night.   1) CBC on admission was 10.0.  Yesterday, Hgb dropped to 7.7 and eventually to 6.8.  Was transfused 2 U pRBC and her Hgb was 9.5 today    2) Pt clinically is comfortable and her vitals are stable.  No tachycardia, diaphoresis, tachypnea   3) Following GI recommendations. Bowel prep yesterday for Colonoscopy today.  Will f/u after this test.   4) Continue holding ASA.    5) Zofran for nausea and Pepcid for GI PPx.  2) Hypotension-Resolved.  1) Continue holding home BP meds.   3) Hypothyroidism- continue home synthroid.   4) FEN- NS @ 75 cc/hr for gentle hydration, NPO except ice chips for right now, monitor electrolytes.  5) DVT PPX- SCD's (no anticoagulation due to bleeding)  6) Disposition- pending clinical improvement, resolution of bleeding and GI recs.   Code Status: Full   Beth Harper R. Beth Fusi, DO of [facility] 03/01/2012, 9:47 AM

## 2012-03-01 NOTE — Op Note (Signed)
[facility] [address] [place], [address]   COLONOSCOPY PROCEDURE REPORT  PATIENT: Beth Harper, Beth Harper  MR#: [id number] BIRTHDATE: [birth year] , 71  yrs. old GENDER: Female ENDOSCOPIST: Wandalee Ferdinand, MD REFERRED BY: PROCEDURE DATE:  03/01/2012 PROCEDURE: ASA CLASS:   3 INDICATIONS:lower GI bleed MEDICATIONS: fentanyl 62.5 mcg IV, Versed 3 mg IV  DESCRIPTION OF PROCEDURE:   After the risks benefits and alternatives of the procedure were thoroughly explained, informed consent was obtained.  rectal exam was without masses.        The Pentax Ped Colon [id number]  endoscope was introduced through the anus and advanced to the cecum     . No adverse events experienced. The quality of the prep was fair       The instrument was then slowly withdrawn as the colon was fully examined.    Findings: Universal diverticulosis was present from the cecum to the sigmoid. The only portion of the colon that diverticuli were not present more in the distal rectum. There was blood scattered throughout the colon along with clots. The colon was extensively washed and diverticuli were washed looking for a potential site of active bleeding, however no specific site of bleeding could be seen. There were probably over 100 diverticuli seen in this patient's colon.  I would recommend a nuclear medicine bleeding scan to see if we can determine a location of bleeding and then if the bleeding scan is positive proceed with arteriogram with embolization.    The time to cecum=  .  Withdrawal time=  .  The scope was withdrawn and the procedure completed. COMPLICATIONS: There were no complications.  ENDOSCOPIC IMPRESSION:see above  RECOMMENDATIONS:nuclear medicine bleeding scan as the next. If this is positive proceed with arteriogram.   eSigned:  Wandalee Ferdinand, MD 03/01/2012 1:55 PM   cc:   PATIENT NAME:  Asucena, Vancuren MR#: [id number]

## 2012-03-01 NOTE — Procedures (Signed)
Proc - Mesentaric Angio  Find - Positive bleeding from branch of R colic. This stopped during the exam. Nitro given to provoke bleeding without consequence.  Int -  No intervention  Comp - None

## 2012-03-01 NOTE — Progress Notes (Signed)
FMTS Attending Note Patient seen and examined by me, her overnight course discussed with on-call resident and 2900 nursing.  Patient has continued to pass red blood per rectum overnight.  Her morning CBC reflects nearly 3gm improvement in Hgb with the 2units PRBC transfusion.  She denies pain but does feel fatigued.  Of note, she has not become hemodynamically unstable (no tachycardia or hypotension).   Assess/Plan: Concern for continued active GI bleed; will place a second IV line in anticipation of possible decompensation and need for IV fluid resuscitation.  Plan for endoscopic evaluation today with Gastroenterology. [name], MD

## 2012-03-01 NOTE — ED Notes (Signed)
15 min VS since starting blood- BP 120/64, HR 67, Sat 100%, resp 12.

## 2012-03-01 NOTE — ED Notes (Signed)
Blood started at 50cc/hr- checked with 2nd RN [place].

## 2012-03-01 NOTE — Progress Notes (Signed)
FMTS Attending Note Patient seen and examined by me today, discussed with resident team and I agree with plan as per note.  Please see my separate progress note for more details.  [name], MD

## 2012-03-01 NOTE — Progress Notes (Signed)
Pt off unit to IR for scheduled procedure.

## 2012-03-01 NOTE — Progress Notes (Signed)
Pt back from radiology 

## 2012-03-01 NOTE — Progress Notes (Signed)
Pt returned from nuclear medicine without RN given me report or notice that pt was on way back to room.   Got pt settled and hooked up to monitor; pt's son at bedside;  Pt slightly confused (probably due to medication from today b/c this is new), pt states she wishes to go home; unfortunately pt's son had to give me "report"/update on events that happened today in nuclear medicine since I did not receive a report from a RN before pt returned; radiologist called and asked for status of pt and had he was going to come in and take her down to IR for angiogram & embolization; consent signed; pt & son aware; emotional support given.

## 2012-03-01 NOTE — ED Notes (Signed)
Rapid response RN called to help RN check blood for transfussion. Hgb 6.1. Pt will get 2 units PRBC's

## 2012-03-02 ENCOUNTER — Encounter (HOSPITAL_COMMUNITY): Payer: Self-pay | Admitting: Gastroenterology

## 2012-03-02 ENCOUNTER — Encounter (HOSPITAL_COMMUNITY): Payer: Self-pay

## 2012-03-02 LAB — CBC
Hemoglobin: 9.7 g/dL — ABNORMAL LOW (ref 12.0–15.0)
Hemoglobin: 7.7 g/dL — ABNORMAL LOW (ref 12.0–15.0)
MCH: 29.9 pg (ref 26.0–34.0)
MCH: 30.6 pg (ref 26.0–34.0)
MCV: 86.4 fL (ref 78.0–100.0)
RBC: 3.24 MIL/uL — ABNORMAL LOW (ref 3.87–5.11)
RBC: 2.52 MIL/uL — ABNORMAL LOW (ref 3.87–5.11)
WBC: 6.7 10*3/uL (ref 4.0–10.5)

## 2012-03-02 LAB — BASIC METABOLIC PANEL
CO2: 20 mEq/L (ref 19–32)
Calcium: 7.9 mg/dL — ABNORMAL LOW (ref 8.4–10.5)
Chloride: 113 mEq/L — ABNORMAL HIGH (ref 96–112)
GFR calc Af Amer: 56 mL/min — ABNORMAL LOW (ref >90)
Sodium: 142 mEq/L (ref 135–145)

## 2012-03-02 LAB — HEMOGLOBIN AND HEMATOCRIT, BLOOD
HCT: 26.3 % — ABNORMAL LOW (ref 36.0–46.0)
Hemoglobin: 9.6 g/dL — ABNORMAL LOW (ref 12.0–15.0)

## 2012-03-02 LAB — PREPARE RBC (CROSSMATCH)

## 2012-03-02 LAB — MAGNESIUM: Magnesium: 1.7 mg/dL (ref 1.5–2.5)

## 2012-03-02 MED ORDER — POTASSIUM CHLORIDE CRYS ER 20 MEQ PO TBCR
40.0000 meq | EXTENDED_RELEASE_TABLET | Freq: Once | ORAL | Status: AC
  Administered 2012-03-02: 40 meq via ORAL
  Filled 2012-03-02: qty 2

## 2012-03-02 MED FILL — Nitroglycerin IV Soln 100 MCG/ML in D5W: INTRA_ARTERIAL | Qty: 10 | Status: AC

## 2012-03-02 NOTE — Progress Notes (Signed)
[facility] Progress Note  Subjective: The patient is doing well this morning. After her colonoscopy yesterday she went for a nuclear medicine GI bleeding scan which showed active bleeding. She then went for an arteriogram which was negative and no intervention was done. Today she noted a little stool with some what sounds like old blood. She feels much better today. She was transfused 2 units of blood yesterday and feels better today.  Objective: Vital signs in last 24 hours: Temp:  [97.3 F (36.3 C)-98.5 F (36.9 C)] 98.4 F (36.9 C) (10/18 0816) Pulse Rate:  [46-96] 76  (10/18 0900) Resp:  [9-28] 16  (10/18 0415) BP: (85-159)/(46-102) 104/52 mmHg (10/18 0900) SpO2:  [96 %-100 %] 100 % (10/18 0900) Weight:  [54.5 kg (120 lb 2.4 oz)] 54.5 kg (120 lb 2.4 oz) (10/18 0500) Weight change: 0.6 kg (1 lb 5.2 oz)   PE  She is in no distress Lab Results: Results for orders placed during the hospital encounter of 02/28/12 (from the past 24 hour(s))  CBC     Status: Abnormal   Collection Time   03/01/12  9:18 PM      Component Value Range   WBC 5.4  4.0 - 10.5 K/uL   RBC 2.08 (*) 3.87 - 5.11 MIL/uL   Hemoglobin 6.1 (*) 12.0 - 15.0 g/dL   HCT 16.1 (*) 09.6 - 04.5 %   MCV 87.0  78.0 - 100.0 fL   MCH 29.3  26.0 - 34.0 pg   MCHC 33.7  30.0 - 36.0 g/dL   RDW 40.9 (*) 81.1 - 91.4 %   Platelets 114 (*) 150 - 400 K/uL  BASIC METABOLIC PANEL     Status: Abnormal   Collection Time   03/01/12  9:18 PM      Component Value Range   Sodium 140  135 - 145 mEq/L   Potassium 3.2 (*) 3.5 - 5.1 mEq/L   Chloride 114 (*) 96 - 112 mEq/L   CO2 22  19 - 32 mEq/L   Glucose, Bld 89  70 - 99 mg/dL   BUN 13  6 - 23 mg/dL   Creatinine, Ser 7.82  0.50 - 1.10 mg/dL   Calcium 7.5 (*) 8.4 - 10.5 mg/dL   GFR calc non Af Amer 53 (*) >90 mL/min   GFR calc Af Amer 61 (*) >90 mL/min  PREPARE RBC (CROSSMATCH)     Status: Normal   Collection Time   03/01/12 10:30 PM      Component Value Range   Order  Confirmation ORDER PROCESSED BY BLOOD BANK    PREPARE RBC (CROSSMATCH)     Status: Normal   Collection Time   03/01/12 10:30 PM      Component Value Range   Order Confirmation ORDER PROCESSED BY BLOOD BANK    CBC     Status: Abnormal   Collection Time   03/02/12  6:05 AM      Component Value Range   WBC 7.6  4.0 - 10.5 K/uL   RBC 3.24 (*) 3.87 - 5.11 MIL/uL   Hemoglobin 9.7 (*) 12.0 - 15.0 g/dL   HCT 95.6 (*) 21.3 - 08.6 %   MCV 86.4  78.0 - 100.0 fL   MCH 29.9  26.0 - 34.0 pg   MCHC 34.6  30.0 - 36.0 g/dL   RDW 57.8  46.9 - 62.9 %   Platelets 100 (*) 150 - 400 K/uL  HEMOGLOBIN AND HEMATOCRIT, BLOOD  Status: Abnormal   Collection Time   03/02/12  9:45 AM      Component Value Range   Hemoglobin 9.6 (*) 12.0 - 15.0 g/dL   HCT 16.1 (*) 09.6 - 04.5 %    Studies/Results: @RISRSLT24 @    Assessment: Lower GI bleed secondary to diverticulosis. Currently the situation appears stable. We need to continue to follow her clinically for further bleeding. If she has recurrence of bleeding I would have interventional radiology do another angiogram to try and find the source of bleeding and embolize it. But this was unsuccessful then she would more than likely need surgical intervention, but at the present time things appear stable  Plan: As above, follow clinically advance diet    Beth Harper F 03/02/2012, 11:12 AM

## 2012-03-02 NOTE — Progress Notes (Signed)
Pt hgb 7.7 on 2120 CBC, down from 8.9 at 1230, MD notified.  Orders received to transfuse 2 units PRBC

## 2012-03-02 NOTE — Progress Notes (Addendum)
PGY-1 Daily Progress Note Family Medicine Teaching Service [place] R. Hess, DO Service Pager: [phone]   Subjective: Pt did have multiple episodes of BRBPR last night and required 2 u PRBC.  Is currently stable and feeling well.  Ready for colonoscopy   Objective:  VITALS Temp:  [97.3 F (36.3 C)-98.5 F (36.9 C)] 98.4 F (36.9 C) (10/18 0816) Pulse Rate:  [46-96] 84  (10/18 0816) Resp:  [9-28] 16  (10/18 0415) BP: (85-159)/(46-102) 111/62 mmHg (10/18 0700) SpO2:  [99 %-100 %] 100 % (10/18 0700) Weight:  [120 lb 2.4 oz (54.5 kg)] 120 lb 2.4 oz (54.5 kg) (10/18 0500)  In/Out  Intake/Output Summary (Last 24 hours) at 03/02/12 0848 Last data filed at 03/02/12 0330  Gross per 24 hour  Intake    630 ml  Output    400 ml  Net    230 ml    Physical Exam: Physical Exam  BP 115/72  Pulse 65  Temp 98.3 F (36.8 C)  Resp 16  SpO2 100%  General appearance: alert, cooperative and no distress  Neck: no JVD and supple, symmetrical, trachea midline  Lungs: clear to auscultation bilaterally  Heart: regular rate and rhythm, S1, S2 normal, no murmur, click, rub or gallop  Abdomen: soft, non-tender; bowel sounds normal; no masses, no organomegaly  Extremities: extremities normal, atraumatic, no cyanosis or edema  Pulses: 2+ and symmetric   MEDS Scheduled Meds:    . sodium chloride   Intravenous Once  .  ceFAZolin (ANCEF) IV  1 g Intravenous Once  . famotidine  20 mg Oral QHS  . levothyroxine  75 mcg Oral Daily  . sodium chloride  3 mL Intravenous Q12H   Continuous Infusions:    . sodium chloride 100 mL/hr (03/01/12 0836)  . DISCONTD: sodium chloride 20 mL/hr at 03/01/12 1228   PRN Meds:.acetaminophen, acetaminophen, fentaNYL, iohexol, midazolam, ondansetron (ZOFRAN) IV, ondansetron, technetium labeled red blood cells, DISCONTD: fentaNYL, DISCONTD: midazolam  Labs and imaging:   CBC  Lab 03/02/12 0605 03/01/12 2118 03/01/12 0645  WBC 7.6 5.4 4.8  HGB 9.7* 6.1* 9.5*    HCT 28.0* 18.1* 27.9*  PLT 100* 114* 157   BMET/CMET  Lab 03/01/12 2118 02/29/12 0645 02/28/12 1014  NA 140 141 141  K 3.2* 4.0 4.4  CL 114* 112 109  CO2 22 26 25   BUN 13 17 20   CREATININE 1.03 1.05 1.10  CALCIUM 7.5* 8.5 9.3  PROT -- -- 6.7  BILITOT -- -- 0.5  ALKPHOS -- -- 31*  ALT -- -- 7  AST -- -- 14  GLUCOSE 89 88 96   Results for orders placed during the hospital encounter of 02/28/12 (from the past 24 hour(s))  CBC     Status: Abnormal   Collection Time   03/01/12  9:18 PM      Component Value Range   WBC 5.4  4.0 - 10.5 K/uL   RBC 2.08 (*) 3.87 - 5.11 MIL/uL   Hemoglobin 6.1 (*) 12.0 - 15.0 g/dL   HCT 45.4 (*) 09.8 - 11.9 %   MCV 87.0  78.0 - 100.0 fL   MCH 29.3  26.0 - 34.0 pg   MCHC 33.7  30.0 - 36.0 g/dL   RDW 14.7 (*) 82.9 - 56.2 %   Platelets 114 (*) 150 - 400 K/uL  BASIC METABOLIC PANEL     Status: Abnormal   Collection Time   03/01/12  9:18 PM      Component Value  Range   Sodium 140  135 - 145 mEq/L   Potassium 3.2 (*) 3.5 - 5.1 mEq/L   Chloride 114 (*) 96 - 112 mEq/L   CO2 22  19 - 32 mEq/L   Glucose, Bld 89  70 - 99 mg/dL   BUN 13  6 - 23 mg/dL   Creatinine, Ser 1.91  0.50 - 1.10 mg/dL   Calcium 7.5 (*) 8.4 - 10.5 mg/dL   GFR calc non Af Amer 53 (*) >90 mL/min   GFR calc Af Amer 61 (*) >90 mL/min  PREPARE RBC (CROSSMATCH)     Status: Normal   Collection Time   03/01/12 10:30 PM      Component Value Range   Order Confirmation ORDER PROCESSED BY BLOOD BANK    PREPARE RBC (CROSSMATCH)     Status: Normal   Collection Time   03/01/12 10:30 PM      Component Value Range   Order Confirmation ORDER PROCESSED BY BLOOD BANK    CBC     Status: Abnormal   Collection Time   03/02/12  6:05 AM      Component Value Range   WBC 7.6  4.0 - 10.5 K/uL   RBC 3.24 (*) 3.87 - 5.11 MIL/uL   Hemoglobin 9.7 (*) 12.0 - 15.0 g/dL   HCT 47.8 (*) 29.5 - 62.1 %   MCV 86.4  78.0 - 100.0 fL   MCH 29.9  26.0 - 34.0 pg   MCHC 34.6  30.0 - 36.0 g/dL   RDW 30.8   65.7 - 84.6 %   Platelets 100 (*) 150 - 400 K/uL    NM Angiogram of Visceral Abdomen: IMPRESSION:  Evidence of active bleeding centered in the right side of the  abdomen. Favored to be within the ascending colon with minimal  retrograde movement into the cecum and antegrade movement into the  transverse colon. A distal small bowel source could look similar.  Colonoscopy 03/01/12 Universal Diverticula with no evidence of likely source of bleed  Assessment  71 year old female with PMHx of Diverticulitis s/p hemicolectomy who presents with bright red blood per rectum:   1) GI bleeding- Pt taken for Colonoscopy yesterday.   1) Colonoscopy noted multiple diverticula, non perforated, but no likely source of bleed    2) Had a NM scan visceral angiogram that noted likely source of bleed in the R ascending colon.  Was taken for embolization   3) Hgb did drop from 9.5 to 6.1 yesterday.  Transfused 2 U pRBC and her Hgb this AM was 9.7  4) Continue holding ASA.    5) Zofran for nausea and Pepcid for GI PPx.  2) Hypotension-Resolved.  1) Continue holding home BP meds.   3) Hypothyroidism- continue home synthroid.   4) FEN- NS @ 100cc/hr for gentle hydration, Advance Diet as tolerated  5) DVT PPX- SCD's (no anticoagulation due to bleeding)  6) Disposition- pending clinical improvement, resolution of bleeding and GI recs.   Code Status: Full   Bryan R. Hess, DO of [facility] 03/02/2012, 8:48 AM   FMTS Attending Note Patient seen and examined by me, discussed with Dr Paulina Fusi and I agree with his assessment/plan.  Patient's course over the past 24 hours reviewed.  She feels fatigued but denies pain.  Plan to follow her Hgb levels closely and watch her vital signs. She should remain in SDU until it is clear that she is not having further blood loss.  The  remainder of the plan as outlined by gastroenterology. [name], MD

## 2012-03-03 ENCOUNTER — Inpatient Hospital Stay (HOSPITAL_COMMUNITY): Payer: Medicare Other

## 2012-03-03 DIAGNOSIS — D62 Acute posthemorrhagic anemia: Secondary | ICD-10-CM | POA: Diagnosis present

## 2012-03-03 DIAGNOSIS — K922 Gastrointestinal hemorrhage, unspecified: Secondary | ICD-10-CM

## 2012-03-03 DIAGNOSIS — D689 Coagulation defect, unspecified: Secondary | ICD-10-CM

## 2012-03-03 DIAGNOSIS — K5730 Diverticulosis of large intestine without perforation or abscess without bleeding: Secondary | ICD-10-CM

## 2012-03-03 LAB — TYPE AND SCREEN
Unit division: 0
Unit division: 0
Unit division: 0

## 2012-03-03 LAB — HEPATIC FUNCTION PANEL
ALT: 10 U/L (ref 0–35)
AST: 20 U/L (ref 0–37)
Albumin: 2.1 g/dL — ABNORMAL LOW (ref 3.5–5.2)
Alkaline Phosphatase: 28 U/L — ABNORMAL LOW (ref 39–117)
Bilirubin, Direct: 0.1 mg/dL (ref 0.0–0.3)
Total Bilirubin: 0.4 mg/dL (ref 0.3–1.2)

## 2012-03-03 LAB — CBC
HCT: 23 % — ABNORMAL LOW (ref 36.0–46.0)
HCT: 27.3 % — ABNORMAL LOW (ref 36.0–46.0)
MCHC: 34.4 g/dL (ref 30.0–36.0)
MCV: 86.9 fL (ref 78.0–100.0)
RDW: 14.4 % (ref 11.5–15.5)
RDW: 14.4 % (ref 11.5–15.5)
WBC: 6 10*3/uL (ref 4.0–10.5)
WBC: 15 10*3/uL — ABNORMAL HIGH (ref 4.0–10.5)

## 2012-03-03 LAB — PREPARE RBC (CROSSMATCH)

## 2012-03-03 MED ORDER — FENTANYL CITRATE 0.05 MG/ML IJ SOLN
INTRAMUSCULAR | Status: AC
  Filled 2012-03-03: qty 2

## 2012-03-03 MED ORDER — QUETIAPINE FUMARATE 25 MG PO TABS
25.0000 mg | ORAL_TABLET | Freq: Once | ORAL | Status: AC
  Administered 2012-03-03: 25 mg via ORAL
  Filled 2012-03-03: qty 1

## 2012-03-03 MED ORDER — LEVOTHYROXINE SODIUM 100 MCG IV SOLR
50.0000 ug | Freq: Every day | INTRAVENOUS | Status: DC
  Administered 2012-03-03 – 2012-03-06 (×4): 50 ug via INTRAVENOUS
  Filled 2012-03-03 (×5): qty 2.5

## 2012-03-03 MED ORDER — FAMOTIDINE IN NACL 20-0.9 MG/50ML-% IV SOLN
20.0000 mg | Freq: Two times a day (BID) | INTRAVENOUS | Status: DC
  Administered 2012-03-03 – 2012-03-06 (×8): 20 mg via INTRAVENOUS
  Filled 2012-03-03 (×13): qty 50

## 2012-03-03 MED ORDER — SODIUM CHLORIDE 0.9 % IV SOLN
Freq: Once | INTRAVENOUS | Status: AC
  Administered 2012-03-03: 09:00:00 via INTRAVENOUS

## 2012-03-03 MED ORDER — FENTANYL CITRATE 0.05 MG/ML IJ SOLN
50.0000 ug | INTRAMUSCULAR | Status: DC | PRN
  Filled 2012-03-03: qty 2

## 2012-03-03 MED ORDER — FENTANYL CITRATE 0.05 MG/ML IJ SOLN
25.0000 ug | INTRAMUSCULAR | Status: DC | PRN
  Administered 2012-03-03: 25 ug via INTRAVENOUS

## 2012-03-03 NOTE — Progress Notes (Signed)
PGY-1 Daily Progress Note Family Medicine Teaching Service [place] R. Soo Steelman, DO Service Pager: [phone]   Subjective: Pt continues to have bloody stool.  Also had her Hgb drop last night and received 2 U pRBC again.  Starting to become agitated   Objective:  VITALS Temp:  [98.2 F (36.8 C)-99.9 F (37.7 C)] 99.9 F (37.7 C) (10/19 0756) Pulse Rate:  [55-103] 85  (10/18 2235) Resp:  [16-22] 16  (10/19 0756) BP: (73-123)/(45-69) 96/58 mmHg (10/19 0756) SpO2:  [84 %-100 %] 100 % (10/19 0400) Weight:  [114 lb 10.2 oz (52 kg)] 114 lb 10.2 oz (52 kg) (10/19 0500)  In/Out  Intake/Output Summary (Last 24 hours) at 03/03/12 0857 Last data filed at 03/03/12 0130  Gross per 24 hour  Intake 1527.49 ml  Output      1 ml  Net 1526.49 ml    Physical Exam: Physical Exam  BP 115/72  Pulse 65  Temp 98.3 F (36.8 C)  Resp 16  SpO2 100%  General appearance: Slightly agitated Neck: no JVD and supple, symmetrical, trachea midline  Lungs: clear to auscultation bilaterally  Heart: Tachycardic, S1, S2 normal, no murmur, click, rub or gallop  Abdomen: soft, non-tender; bowel sounds normal; no masses, no organomegaly  Extremities: extremities normal, atraumatic, no cyanosis or edema  Pulses: 2+ and symmetric   MEDS Scheduled Meds:    . sodium chloride   Intravenous Once  . sodium chloride   Intravenous Once  . famotidine  20 mg Oral QHS  . levothyroxine  75 mcg Oral Daily  . potassium chloride  40 mEq Oral Once  . QUEtiapine  25 mg Oral Once  . sodium chloride  3 mL Intravenous Q12H   Continuous Infusions:    . sodium chloride Stopped (03/02/12 2235)   PRN Meds:.acetaminophen, acetaminophen, ondansetron (ZOFRAN) IV, ondansetron  Labs and imaging:   CBC  Lab 03/03/12 0525 03/02/12 2116 03/02/12 1236 03/02/12 0605  WBC 6.0 6.7 -- 7.6  HGB 7.9* 7.7* 8.9* --  HCT 23.0* 21.7* 26.3* --  PLT 80* 107* -- 100*   BMET/CMET  Lab 03/02/12 1236 03/01/12 2118 02/29/12 0645 02/28/12  1014  NA 142 140 141 --  K 3.8 3.2* 4.0 --  CL 113* 114* 112 --  CO2 20 22 26  --  BUN 15 13 17  --  CREATININE 1.11* 1.03 1.05 --  CALCIUM 7.9* 7.5* 8.5 --  PROT -- -- -- 6.7  BILITOT -- -- -- 0.5  ALKPHOS -- -- -- 31*  ALT -- -- -- 7  AST -- -- -- 14  GLUCOSE 84 89 88 --   Results for orders placed during the hospital encounter of 02/28/12 (from the past 24 hour(s))  HEMOGLOBIN AND HEMATOCRIT, BLOOD     Status: Abnormal   Collection Time   03/02/12  9:45 AM      Component Value Range   Hemoglobin 9.6 (*) 12.0 - 15.0 g/dL   HCT 62.1 (*) 30.8 - 65.7 %  HEMOGLOBIN AND HEMATOCRIT, BLOOD     Status: Abnormal   Collection Time   03/02/12 12:36 PM      Component Value Range   Hemoglobin 8.9 (*) 12.0 - 15.0 g/dL   HCT 84.6 (*) 96.2 - 95.2 %  BASIC METABOLIC PANEL     Status: Abnormal   Collection Time   03/02/12 12:36 PM      Component Value Range   Sodium 142  135 - 145 mEq/L   Potassium 3.8  3.5 - 5.1 mEq/L   Chloride 113 (*) 96 - 112 mEq/L   CO2 20  19 - 32 mEq/L   Glucose, Bld 84  70 - 99 mg/dL   BUN 15  6 - 23 mg/dL   Creatinine, Ser 1.61 (*) 0.50 - 1.10 mg/dL   Calcium 7.9 (*) 8.4 - 10.5 mg/dL   GFR calc non Af Amer 48 (*) >90 mL/min   GFR calc Af Amer 56 (*) >90 mL/min  MAGNESIUM     Status: Normal   Collection Time   03/02/12 12:36 PM      Component Value Range   Magnesium 1.7  1.5 - 2.5 mg/dL  CBC     Status: Abnormal   Collection Time   03/02/12  9:16 PM      Component Value Range   WBC 6.7  4.0 - 10.5 K/uL   RBC 2.52 (*) 3.87 - 5.11 MIL/uL   Hemoglobin 7.7 (*) 12.0 - 15.0 g/dL   HCT 09.6 (*) 04.5 - 40.9 %   MCV 86.1  78.0 - 100.0 fL   MCH 30.6  26.0 - 34.0 pg   MCHC 35.5  30.0 - 36.0 g/dL   RDW 81.1  91.4 - 78.2 %   Platelets 107 (*) 150 - 400 K/uL  PREPARE RBC (CROSSMATCH)     Status: Normal   Collection Time   03/02/12 10:30 PM      Component Value Range   Order Confirmation ORDER PROCESSED BY BLOOD BANK    TYPE AND SCREEN     Status: Normal  (Preliminary result)   Collection Time   03/03/12  3:00 AM      Component Value Range   ABO/RH(D) AB POS     Antibody Screen NEG     Sample Expiration 03/06/2012     Unit Number [id number]     Blood Component Type RED CELLS,LR     Unit division 00     Status of Unit ALLOCATED     Transfusion Status OK TO TRANSFUSE     Crossmatch Result Compatible    PREPARE RBC (CROSSMATCH)     Status: Normal   Collection Time   03/03/12  4:47 AM      Component Value Range   Order Confirmation ORDER PROCESSED BY BLOOD BANK    CBC     Status: Abnormal   Collection Time   03/03/12  5:25 AM      Component Value Range   WBC 6.0  4.0 - 10.5 K/uL   RBC 2.68 (*) 3.87 - 5.11 MIL/uL   Hemoglobin 7.9 (*) 12.0 - 15.0 g/dL   HCT 69.6 (*) 29.5 - 28.4 %   MCV 85.8  78.0 - 100.0 fL   MCH 29.5  26.0 - 34.0 pg   MCHC 34.3  30.0 - 36.0 g/dL   RDW 13.2  44.0 - 10.2 %   Platelets 80 (*) 150 - 400 K/uL       Assessment  71 year old female with PMHx of Diverticulitis s/p hemicolectomy who presents with bright red blood per rectum:   1) GI bleeding- Pt reported 5 episodes of BRPBR last night.   1) CBC yesterday dropped to 7.7.  Received another 2 u PRBC overnight.  Continue to monitor q 4 hrs H & H    2) Becoming hypotensive and tachycardic.  Consult to General Surg, IR, and CCM for further management.  IR does not believe they will be able to stop the  bleeding after exhausting embolization on Thursday night.  Recommend Gen Surg to take to the OR.  Will get CCM on board as well in case pt continues to deteriorate.   3) Continue holding ASA.    4) Zofran for nausea and Pepcid for GI PPx.  2) Hypotension-  1) Became hypotensive again overnight.  Received 2 u PRBC again overnight  2) NS bolus 500 cc. Consider repeating if continues to be hypotensive.   3) Continue holding home BP meds.   3) Hypothyroidism- continue home synthroid.   4) FEN- NS @ 100 cc/hr.  Bolus NS depending on pressure.  5) DVT PPX-  SCD's (no anticoagulation due to bleeding)  6) Disposition- Transfer to ICU today for closer management.   Code Status: Full   Rickiya Picariello R. Paulina Fusi, DO of [facility] 03/03/2012, 8:57 AM

## 2012-03-03 NOTE — Consult Note (Addendum)
Name: Beth Harper MRN: [id number] DOB: 1941-02-07    LOS: 4  Referring Provider:  Teaching Service Reason for Referral:  GIB with hypotension  PULMONARY / CRITICAL CARE MEDICINE  HPI:  71 AAF with a hx of extensive bowel surgeries  , diverticulosis, who was admitted 10-15 with GIB. Despite colonoscopy and IR attempted intervention she was moved to ICU 10-19 with hypotension secondary to GIB. CCS is following as is GI services. PCCM consulted 10-19 due to hypotension.  Past Medical History  Diagnosis Date  . Hypertension   . Diverticula, small intestine   . Hyperlipidemia   . Hypothyroidism    Past Surgical History  Procedure Date  . Colon surgery 09/22/2000  . Appendectomy 09/22/2000  . Colonoscopy 03/01/2012    Procedure: COLONOSCOPY;  Surgeon: Graylin Shiver, MD;  Location: [facility] ENDOSCOPY;  Service: Endoscopy;  Laterality: N/A;  Pat   Prior to Admission medications   Medication Sig Start Date End Date Taking? Authorizing Provider  acetaminophen (TYLENOL) 500 MG tablet Take 500 mg by mouth every 6 (six) hours as needed. For pain   Yes Historical Provider, MD  aspirin 81 MG EC tablet Take 81 mg by mouth daily as needed. For pain/daily maintenance  When she remembers   Yes Historical Provider, MD  Calcium Carbonate-Vitamin D (CALCIUM-VITAMIN D) 500-200 MG-UNIT per tablet Take 1 tablet by mouth 2 (two) times daily with a meal. 08/29/11 08/28/12 Yes Josalyn Funches, MD  hydrochlorothiazide (HYDRODIURIL) 25 MG tablet Take 1 tablet (25 mg total) by mouth daily. 08/30/11  Yes Josalyn Funches, MD  levothyroxine (SYNTHROID, LEVOTHROID) 75 MCG tablet Take 1 tablet (75 mcg total) by mouth daily. 08/30/11  Yes Josalyn Funches, MD  lisinopril (PRINIVIL,ZESTRIL) 40 MG tablet Take 1 tablet (40 mg total) by mouth daily. 08/30/11  Yes Josalyn Funches, MD  pravastatin (PRAVACHOL) 80 MG tablet Take 1 tablet (80 mg total) by mouth daily. 08/30/11 08/29/12 Yes Josalyn Funches, MD  ranitidine (ZANTAC) 150 MG  tablet Take 1 tablet (150 mg total) by mouth at bedtime. 08/30/11  Yes Dessa Phi, MD   Allergies No Known Allergies  Family History History reviewed. No pertinent family history. Social History  reports that she quit smoking about 21 months ago. She has never used smokeless tobacco. She reports that she drinks alcohol. She reports that she does not use illicit drugs.  Review Of Systems:  Taken extensively see hpi for details.  Brief patient description:  71 yo AAF in NAD but hypotensive  Events Since Admission: Hypotension secondary to blood loss GI 10-19  Current Status: hypotensive Vital Signs: Temp:  [98.2 F (36.8 C)-100 F (37.8 C)] 100 F (37.8 C) (10/19 0939) Pulse Rate:  [55-101] 85  (10/18 2235) Resp:  [16-22] 16  (10/19 0939) BP: (73-123)/(45-69) 98/57 mmHg (10/19 0939) SpO2:  [84 %-100 %] 97 % (10/19 0756) Weight:  [52 kg (114 lb 10.2 oz)] 52 kg (114 lb 10.2 oz) (10/19 0500)  Physical Examination: General:  Awake and alert. CO abd pain Neuro:  Intact, mae x 4 HEENT:  No lan Neck:  No jvd Cardiovascular:  hsr rrr sr Lungs:  cta Abdomen:  Tender. +bloody stools Musculoskeletal:  intact Skin:  Warm, no edema  Principal Problem:  *GI bleed Active Problems:  HYPOTHYROIDISM, UNSPECIFIED  ANEMIA  HYPERTENSION, BENIGN SYSTEMIC  GERD   ASSESSMENT AND PLAN  PULMONARY No results found for this basename: PHART:5,PCO2:5,PCO2ART:5,PO2ART:5,HCO3:5,O2SAT:5 in the last 168 hours  Nasal cannula       ETT:  none  A:No acute issue  P:   monitor   CARDIOVASCULAR  Lab 02/28/12 1015 02/28/12 1014  TROPONINI -- <0.30  LATICACIDVEN 1.5 --  PROBNP -- --    ECG: NAD Lines: 10-19 lt i j cvl>>  A: Hypotension secondary blood loss anemia.  Hx of HTN.  P:  Place cvl Fluid and blood resusitation Hold anthypertensives  RENAL  Lab 03/02/12 1236 03/01/12 2118 02/29/12 0645 02/28/12 1014  NA 142 140 141 141  K 3.8 3.2* -- --  CL 113* 114* 112  109  CO2 20 22 26 25   BUN 15 13 17 20   CREATININE 1.11* 1.03 1.05 1.10  CALCIUM 7.9* 7.5* 8.5 9.3  MG 1.7 -- -- --  PHOS -- -- -- --   Intake/Output      10/18 0701 - 10/19 0700 10/19 0701 - 10/20 0700   P.O. 480    I.V. (mL/kg) 821.7 (15.8) 600 (11.5)   Blood 345.8 350   Total Intake(mL/kg) 1647.5 (31.7) 950 (18.3)   Urine (mL/kg/hr)     Stool 1    Total Output 1    Net +1646.5 +950        Urine Occurrence 4 x    Stool Occurrence 4 x     Foley:  10-19  A: Renal Insuff Lab Results  Component Value Date   CREATININE 1.11* 03/02/2012   CREATININE 1.03 03/01/2012   CREATININE 1.05 02/29/2012   CREATININE 1.09 08/29/2011   CREATININE 1.15* 03/18/2011   CREATININE 1.14 07/20/2010    P:   Hydrate and monitor creatine Stop ace-i   GASTROINTESTINAL  Lab 02/28/12 1014  AST 14  ALT 7  ALKPHOS 31*  BILITOT 0.5  PROT 6.7  ALBUMIN 3.6    A:LGIB suspect from diverticular disease. Surgery following  P:   See Hematological Get FFP and PRBC and Plt transfused then f/u for poss NM bleeding study in AM if still bleeding  HEMATOLOGIC  Lab 03/03/12 0525 03/02/12 2116 03/02/12 1236 03/02/12 0945 03/02/12 0605 03/01/12 2118 03/01/12 0645 02/28/12 1014  HGB 7.9* 7.7* 8.9* 9.6* 9.7* -- -- --  HCT 23.0* 21.7* 26.3* 27.7* 28.0* -- -- --  PLT 80* 107* -- -- 100* 114* 157 --  INR -- -- -- -- -- -- -- 0.97  APTT -- -- -- -- -- -- -- 21*   A:Acute Blood loss anemia, thrombocytopenia P:  GI following CCS following Place cvl in case needed for further GI bleed. Transfuse for Hgb <7.0 F/u INR/PT  PTT F/u H/H trfx 2 u FFP and one unit Plt   INFECTIOUS  Lab 03/03/12 0525 03/02/12 2116 03/02/12 0605 03/01/12 2118 03/01/12 0645  WBC 6.0 6.7 7.6 5.4 4.8  PROCALCITON -- -- -- -- --   Cultures: None  Antibiotics: none   A:  No acute issue P:   No ID issues   ENDOCRINE No results found for this basename: GLUCAP:5 in the last 168 hours A: Hypothyroid   P:   tsh 10.49  08-29-11 Recheck tsh started synthroid IV   NEUROLOGIC  A:  No acute issue P:     BEST PRACTICE / DISPOSITION Level of Care:  ICU Primary Service:  TS with change to PCCM 10-19 Consultants: GI(Ganem) CCS(Ramierez) Code Status:  Full Diet:  NPO DVT Px:  SCD GI Px:  PPI Skin Integrity:  intact Social / Family:  Son up dated at bedside 10-19 1000am  CC Dorcas Carrow Beeper  [phone]  Cell  [phone]  If no response or cell goes to voicemail, call beeper [phone]  03/03/2012, 10:03 AM

## 2012-03-03 NOTE — Progress Notes (Signed)
Pt's son returned to room .The patient  was overheard talking loudly ,telling son to move away from  her . Pt 's son was heard telling the Pt not to get OOB . Pt was sitting upright in bed,hosp gown revealing chest .Pt then began telling nurse to not come near her. Ptt continued to talk w/Pt while charge nurse was called.Nurse remained close to door  So as to not anger Pt more.

## 2012-03-03 NOTE — Progress Notes (Signed)
She is doing well at the present time. Surgery has been consulted. If she has further bleeding then we will get interventional radiology involved again to see if they can localize the site of bleeding and embolize it.

## 2012-03-03 NOTE — Consult Note (Signed)
Reason for Consult: LGIB Referring Physician: Dr. Paulina Fusi, Family Medicine  Beth Harper is an 71 y.o. female.  HPI: The patient is a 71 y/o F who was admitted earlier this week with LGIB.  Pt was worked up with c-scope and found to have numerous >100 diverticuli in her entire colon.  Pt has a hx of having a L colectomy for diverticulitis, ileostomy and ileostomy takedown.  Pt also had IR and nucmed which showed R colon bleed.  Did not get embolized per IR.  Pt has received 4 U PRBCs while in hosp.  Pt has had bloody BMs con't while admitted.  Pt currently AOx4 and stable.  Past Medical History  Diagnosis Date  . Hypertension   . Diverticula, small intestine   . Hyperlipidemia   . Hypothyroidism     Past Surgical History  Procedure Date  . Colon surgery 09/22/2000  . Appendectomy 09/22/2000  . Colonoscopy 03/01/2012    Procedure: COLONOSCOPY;  Surgeon: Graylin Shiver, MD;  Location: [facility] ENDOSCOPY;  Service: Endoscopy;  Laterality: N/A;  Pat    History reviewed. No pertinent family history.  Social History:  reports that she quit smoking about 21 months ago. She has never used smokeless tobacco. She reports that she drinks alcohol. She reports that she does not use illicit drugs.  Allergies: No Known Allergies  Medications: I have reviewed the patient's current medications.  Results for orders placed during the hospital encounter of 02/28/12 (from the past 48 hour(s))  CBC     Status: Abnormal   Collection Time   03/01/12  9:18 PM      Component Value Range Comment   WBC 5.4  4.0 - 10.5 K/uL    RBC 2.08 (*) 3.87 - 5.11 MIL/uL    Hemoglobin 6.1 (*) 12.0 - 15.0 g/dL    HCT 19.1 (*) 47.8 - 46.0 %    MCV 87.0  78.0 - 100.0 fL    MCH 29.3  26.0 - 34.0 pg    MCHC 33.7  30.0 - 36.0 g/dL    RDW 29.5 (*) 62.1 - 15.5 %    Platelets 114 (*) 150 - 400 K/uL   BASIC METABOLIC PANEL     Status: Abnormal   Collection Time   03/01/12  9:18 PM      Component Value Range Comment   Sodium 140   135 - 145 mEq/L    Potassium 3.2 (*) 3.5 - 5.1 mEq/L DELTA CHECK NOTED   Chloride 114 (*) 96 - 112 mEq/L    CO2 22  19 - 32 mEq/L    Glucose, Bld 89  70 - 99 mg/dL    BUN 13  6 - 23 mg/dL    Creatinine, Ser 3.08  0.50 - 1.10 mg/dL    Calcium 7.5 (*) 8.4 - 10.5 mg/dL    GFR calc non Af Amer 53 (*) >90 mL/min    GFR calc Af Amer 61 (*) >90 mL/min   PREPARE RBC (CROSSMATCH)     Status: Normal   Collection Time   03/01/12 10:30 PM      Component Value Range Comment   Order Confirmation ORDER PROCESSED BY BLOOD BANK     PREPARE RBC (CROSSMATCH)     Status: Normal   Collection Time   03/01/12 10:30 PM      Component Value Range Comment   Order Confirmation ORDER PROCESSED BY BLOOD BANK     CBC     Status: Abnormal  Collection Time   03/02/12  6:05 AM      Component Value Range Comment   WBC 7.6  4.0 - 10.5 K/uL    RBC 3.24 (*) 3.87 - 5.11 MIL/uL    Hemoglobin 9.7 (*) 12.0 - 15.0 g/dL POST TRANSFUSION SPECIMEN   HCT 28.0 (*) 36.0 - 46.0 %    MCV 86.4  78.0 - 100.0 fL    MCH 29.9  26.0 - 34.0 pg    MCHC 34.6  30.0 - 36.0 g/dL    RDW 29.5  62.1 - 30.8 %    Platelets 100 (*) 150 - 400 K/uL CONSISTENT WITH PREVIOUS RESULT  HEMOGLOBIN AND HEMATOCRIT, BLOOD     Status: Abnormal   Collection Time   03/02/12  9:45 AM      Component Value Range Comment   Hemoglobin 9.6 (*) 12.0 - 15.0 g/dL    HCT 65.7 (*) 84.6 - 46.0 %   HEMOGLOBIN AND HEMATOCRIT, BLOOD     Status: Abnormal   Collection Time   03/02/12 12:36 PM      Component Value Range Comment   Hemoglobin 8.9 (*) 12.0 - 15.0 g/dL    HCT 96.2 (*) 95.2 - 46.0 %   BASIC METABOLIC PANEL     Status: Abnormal   Collection Time   03/02/12 12:36 PM      Component Value Range Comment   Sodium 142  135 - 145 mEq/L    Potassium 3.8  3.5 - 5.1 mEq/L    Chloride 113 (*) 96 - 112 mEq/L    CO2 20  19 - 32 mEq/L    Glucose, Bld 84  70 - 99 mg/dL    BUN 15  6 - 23 mg/dL    Creatinine, Ser 8.41 (*) 0.50 - 1.10 mg/dL    Calcium 7.9 (*) 8.4 -  10.5 mg/dL    GFR calc non Af Amer 48 (*) >90 mL/min    GFR calc Af Amer 56 (*) >90 mL/min   MAGNESIUM     Status: Normal   Collection Time   03/02/12 12:36 PM      Component Value Range Comment   Magnesium 1.7  1.5 - 2.5 mg/dL   CBC     Status: Abnormal   Collection Time   03/02/12  9:16 PM      Component Value Range Comment   WBC 6.7  4.0 - 10.5 K/uL    RBC 2.52 (*) 3.87 - 5.11 MIL/uL    Hemoglobin 7.7 (*) 12.0 - 15.0 g/dL    HCT 32.4 (*) 40.1 - 46.0 %    MCV 86.1  78.0 - 100.0 fL    MCH 30.6  26.0 - 34.0 pg    MCHC 35.5  30.0 - 36.0 g/dL    RDW 02.7  25.3 - 66.4 %    Platelets 107 (*) 150 - 400 K/uL CONSISTENT WITH PREVIOUS RESULT  PREPARE RBC (CROSSMATCH)     Status: Normal   Collection Time   03/02/12 10:30 PM      Component Value Range Comment   Order Confirmation ORDER PROCESSED BY BLOOD BANK     TYPE AND SCREEN     Status: Normal (Preliminary result)   Collection Time   03/03/12  3:00 AM      Component Value Range Comment   ABO/RH(D) AB POS      Antibody Screen NEG      Sample Expiration 03/06/2012      Unit Number  [id number]      Blood Component Type RED CELLS,LR      Unit division 00      Status of Unit ISSUED      Transfusion Status OK TO TRANSFUSE      Crossmatch Result Compatible     PREPARE RBC (CROSSMATCH)     Status: Normal   Collection Time   03/03/12  4:47 AM      Component Value Range Comment   Order Confirmation ORDER PROCESSED BY BLOOD BANK     CBC     Status: Abnormal   Collection Time   03/03/12  5:25 AM      Component Value Range Comment   WBC 6.0  4.0 - 10.5 K/uL    RBC 2.68 (*) 3.87 - 5.11 MIL/uL    Hemoglobin 7.9 (*) 12.0 - 15.0 g/dL    HCT 30.8 (*) 65.7 - 46.0 %    MCV 85.8  78.0 - 100.0 fL    MCH 29.5  26.0 - 34.0 pg    MCHC 34.3  30.0 - 36.0 g/dL    RDW 84.6  96.2 - 95.2 %    Platelets 80 (*) 150 - 400 K/uL CONSISTENT WITH PREVIOUS RESULT    Nm Gi Blood Loss  03/01/2012  *RADIOLOGY REPORT*  Clinical Data:  Blood per rectum.   Colonoscopy demonstrating blood but no active bleeding.  NUCLEAR MEDICINE GASTROINTESTINAL BLEEDING SCAN  Technique:  Sequential abdominal images were obtained following intravenous administration of Tc-46m labeled red blood cells.  Radiopharmaceutical: CURIE ULTRATAG TECHNETIUM TC 69M- LABELED RED BLOOD CELLS IV KIT  mCi Tc-31m in-vitro labeled red cells.  Comparison:  None.  Findings: Most apparent during the third hour of imaging is tracer in the right side of the abdomen.  This appears to move both inferiorly and towards the midline.  On the early images, there is equivocal tracer uptake in the left upper quadrant.  IMPRESSION: Evidence of active bleeding centered in the right side of the abdomen.  Favored to be within the ascending colon with minimal retrograde movement into the cecum and antegrade movement into the transverse colon.  A distal small bowel source could look similar.  These results will be called to the ordering clinician or representative by the Radiologist Assistant, and communication documented in the PACS Dashboard.   Original Report Authenticated By: Consuello Bossier, M.D.     Review of Systems  Constitutional: Negative.   HENT: Negative.   Respiratory: Negative.   Cardiovascular: Negative.   Gastrointestinal: Positive for blood in stool.  Musculoskeletal: Negative.   Neurological: Negative.    Blood pressure 98/57, pulse 85, temperature 100 F (37.8 C), temperature source Oral, resp. rate 16, height 5\' 1"  (1.549 m), weight 114 lb 10.2 oz (52 kg), SpO2 97.00%. Physical Exam  Constitutional: She is oriented to person, place, and time. She appears well-developed and well-nourished.  HENT:  Head: Normocephalic and atraumatic.  Eyes: Conjunctivae normal and EOM are normal. Pupils are equal, round, and reactive to light.  Neck: Normal range of motion. Neck supple.  Cardiovascular: Regular rhythm and normal heart sounds.   Respiratory: Effort normal and breath sounds  normal.  GI: Soft. She exhibits no distension. There is no tenderness. There is no rebound and no guarding.  Musculoskeletal: Normal range of motion.  Neurological: She is alert and oriented to person, place, and time.    Assessment/Plan: 17 F with LGIB and diverticulosis  Pt is currently stable and oriented. Pt has  had a source of bleed ID'd per IR.  I don't think the patient is currently bleeding.  However considering her recent events, I would rec to con't resuscitation, monitor UOP and check Coags and correct any coagulopathy.  She has had mult abd/colon surgeries in the past, and would likely require a total colectomy with end iloestomy because of the amount of diverticuli present in her colon.  Secondary to her prev surgeries and current condition regarding her colon diverticuli, I would rec Nuc Med and IR to re-eval for possible intervention should she declare herself with LGIB.  I spoke with Dr. Grace Isaac (IR) who agrees.  Dr. Paulina Fusi has been notified regarding my plan and the conversation with Dr. Grace Isaac.  We will con't to follow.    Marigene Ehlers., Nyjah Denio 03/03/2012, 9:49 AM

## 2012-03-03 NOTE — Procedures (Signed)
Central Venous Catheter Insertion Procedure Note Tannah Vossler [id number] 07/15/1940  Procedure: Insertion of Central Venous Catheter Indications: Assessment of intravascular volume, Drug and/or fluid administration and Frequent blood sampling  Procedure Details Consent: Risks of procedure as well as the alternatives and risks of each were explained to the (patient/caregiver).  Consent for procedure obtained. Time Out: Verified patient identification, verified procedure, site/side was marked, verified correct patient position, special equipment/implants available, medications/allergies/relevent history reviewed, required imaging and test results available.  Performed  Maximum sterile technique was used including .hat, mask, gown, drape. Skin prep: Chlorhexidine; local anesthetic administered A antimicrobial bonded/coated triple lumen catheter was placed in the right internal jugular vein using the Seldinger technique. Ultrasound guidance used.yes Catheter placed to 18 cm. Blood aspirated via all 3 ports and then flushed x 3. Line sutured x 2 and dressing applied.  Evaluation Blood flow good Complications: No apparent complications Patient did tolerate procedure well. Chest X-ray ordered to verify placement.  CXR: pending.  Brett Canales Minor ACNP [facility] PCCM Pager [phone] till 3 pm If no answer page [phone] 03/03/2012, 10:23 AM   I was present for the procedure  Luisa Hart WrightMD

## 2012-03-03 NOTE — Progress Notes (Signed)
Patient seen and examined by me today, see my separate note for details.  I discussed with resident team and I agree with their plan.  [name], MD

## 2012-03-03 NOTE — Progress Notes (Signed)
FMTS Attending Note  Patient seen and examined by me, discussed with resident team now and with patient's son [name], who is her medical POA.  Patient has continued to have red blood per rectum overnight; [name] reports that she has been confused and hostile to him and another son who was visiting yesterday.  This morning she is oriented to place "the hospital" but is wanting to get up to go to church.  Redirectable. Vital signs on monitor this morning show pulse in the low 100s with SBP @90 ; no pulse recorded in EMR since 7/18.  Her CBC this morning was drawn after 1 unit PRBC; she has a second unit pending.  Patient with one peripheral IV site which I am told is not functioning adequately.   Exam Easily arousable, mild distress ABD Soft; some grimace with palpation but no guarding at the time of my exam.   Assess/Plan: Patient with GIB, actively bleeding.  Source of bleeding not identifiable on colonoscopy.  Now showing signs of hemodynamic instability on vital signs on monitor.  I gave verbal orders for TWO large-bore IVs, and an initial 500cc NS bolus with a second bolus available.  To receive the second unit of PRBCs.  We will want to T&C for additional units as well.  For general surgery consult and CCM consult, as patient has heretofore been in "Step-Down" status in 2900 but will now need transfer to ICU-level care.  [name], MD

## 2012-03-04 DIAGNOSIS — I9589 Other hypotension: Secondary | ICD-10-CM

## 2012-03-04 LAB — CBC
Hemoglobin: 8.3 g/dL — ABNORMAL LOW (ref 12.0–15.0)
Hemoglobin: 8.7 g/dL — ABNORMAL LOW (ref 12.0–15.0)
MCH: 29.9 pg (ref 26.0–34.0)
MCH: 30.2 pg (ref 26.0–34.0)
MCH: 29.5 pg (ref 26.0–34.0)
MCHC: 34.3 g/dL (ref 30.0–36.0)
MCHC: 34.7 g/dL (ref 30.0–36.0)
MCV: 87.3 fL (ref 78.0–100.0)
Platelets: 132 10*3/uL — ABNORMAL LOW (ref 150–400)
RDW: 14.7 % (ref 11.5–15.5)
WBC: 14.5 10*3/uL — ABNORMAL HIGH (ref 4.0–10.5)

## 2012-03-04 LAB — BASIC METABOLIC PANEL
Calcium: 8 mg/dL — ABNORMAL LOW (ref 8.4–10.5)
GFR calc non Af Amer: 42 mL/min — ABNORMAL LOW (ref >90)
Glucose, Bld: 80 mg/dL (ref 70–99)
Sodium: 140 mEq/L (ref 135–145)

## 2012-03-04 LAB — PREPARE PLATELET PHERESIS

## 2012-03-04 LAB — MAGNESIUM: Magnesium: 1.6 mg/dL (ref 1.5–2.5)

## 2012-03-04 MED ORDER — KCL IN DEXTROSE-NACL 20-5-0.45 MEQ/L-%-% IV SOLN
INTRAVENOUS | Status: DC
  Administered 2012-03-04 (×2): via INTRAVENOUS
  Administered 2012-03-05: 1000 mL via INTRAVENOUS
  Filled 2012-03-04 (×5): qty 1000

## 2012-03-04 MED ORDER — FENTANYL CITRATE 0.05 MG/ML IJ SOLN: 12.5000 ug | INTRAMUSCULAR | Status: DC | PRN

## 2012-03-04 NOTE — Progress Notes (Signed)
PGY-1 Daily Progress Note Family Medicine Teaching Service Service Pager: [phone]   Subjective: Feels ok this morning.  Had BM this am and looked less bloody than previous BM's.  Denies any pain.   Objective:  VITALS Temp:  [97.9 F (36.6 C)-100 F (37.8 C)] 98.6 F (37 C) (10/20 0800) Pulse Rate:  [33-141] 84  (10/20 0800) Resp:  [16-22] 22  (10/20 0800) BP: (94-145)/(29-77) 135/72 mmHg (10/20 0800) SpO2:  [94 %-100 %] 100 % (10/20 0800) Weight:  [122 lb 9.2 oz (55.6 kg)] 122 lb 9.2 oz (55.6 kg) (10/20 0500)  In/Out  Intake/Output Summary (Last 24 hours) at 03/04/12 0831 Last data filed at 03/04/12 0800  Gross per 24 hour  Intake 2609.17 ml  Output    975 ml  Net 1634.17 ml    Physical Exam: Physical Exam  BP 115/72  Pulse 65  Temp 98.3 F (36.8 C)  Resp 16  SpO2 100%  General appearance: Slightly agitated Neck: no JVD and supple, symmetrical, trachea midline  Lungs: clear to auscultation bilaterally  Heart: Tachycardic, S1, S2 normal, no murmur, click, rub or gallop  Abdomen: soft, non-tender; bowel sounds normal; no masses, no organomegaly  Extremities: extremities normal, atraumatic, no cyanosis or edema  Pulses: 2+ and symmetric   MEDS Scheduled Meds:    . sodium chloride   Intravenous Once  . famotidine (PEPCID) IV  20 mg Intravenous Q12H  . fentaNYL      . levothyroxine  50 mcg Intravenous Daily  . sodium chloride  3 mL Intravenous Q12H  . DISCONTD: famotidine  20 mg Oral QHS  . DISCONTD: levothyroxine  75 mcg Oral Daily   Continuous Infusions:    . sodium chloride 10 mL/hr at 03/03/12 1630   PRN Meds:.acetaminophen, acetaminophen, fentaNYL, fentaNYL, ondansetron (ZOFRAN) IV, ondansetron  Labs and imaging:   CBC  Lab 03/04/12 0305 03/03/12 1315 03/03/12 0525  WBC 13.9* 15.0* 6.0  HGB 8.3* 9.4* 7.9*  HCT 24.2* 27.3* 23.0*  PLT 115* 91* 80*   BMET/CMET  Lab 03/04/12 0305 03/03/12 1315 03/02/12 1236 03/01/12 2118 02/28/12 1014  NA 140  -- 142 140 --  K 3.6 -- 3.8 3.2* --  CL 112 -- 113* 114* --  CO2 22 -- 20 22 --  BUN 22 -- 15 13 --  CREATININE 1.25* -- 1.11* 1.03 --  CALCIUM 8.0* -- 7.9* 7.5* --  PROT -- 4.1* -- -- 6.7  BILITOT -- 0.4 -- -- 0.5  ALKPHOS -- 28* -- -- 31*  ALT -- 10 -- -- 7  AST -- 20 -- -- 14  GLUCOSE 80 -- 84 89 --   Results for orders placed during the hospital encounter of 02/28/12 (from the past 24 hour(s))  PREPARE RBC (CROSSMATCH)     Status: Normal   Collection Time   03/03/12 10:00 AM      Component Value Range   Order Confirmation ORDER PROCESSED BY BLOOD BANK    PREPARE FRESH FROZEN PLASMA     Status: Normal (Preliminary result)   Collection Time   03/03/12 11:10 AM      Component Value Range   Unit Number [id number]     Blood Component Type THAWED PLASMA     Unit division 00     Status of Unit ISSUED,FINAL     Transfusion Status OK TO TRANSFUSE     Unit Number [id number]     Blood Component Type THAWED PLASMA     Unit division 00  Status of Unit ISSUED     Transfusion Status OK TO TRANSFUSE    PREPARE PLATELET PHERESIS     Status: Normal   Collection Time   03/03/12 11:18 AM      Component Value Range   Unit Number [id number]     Blood Component Type PLTPHER LR1     Unit division 00     Status of Unit ISSUED,FINAL     Transfusion Status OK TO TRANSFUSE    PROTIME-INR     Status: Normal   Collection Time   03/03/12  1:15 PM      Component Value Range   Prothrombin Time 14.2  11.6 - 15.2 seconds   INR 1.11  0.00 - 1.49  APTT     Status: Normal   Collection Time   03/03/12  1:15 PM      Component Value Range   aPTT 27  24 - 37 seconds  HEPATIC FUNCTION PANEL     Status: Abnormal   Collection Time   03/03/12  1:15 PM      Component Value Range   Total Protein 4.1 (*) 6.0 - 8.3 g/dL   Albumin 2.1 (*) 3.5 - 5.2 g/dL   AST 20  0 - 37 U/L   ALT 10  0 - 35 U/L   Alkaline Phosphatase 28 (*) 39 - 117 U/L   Total Bilirubin 0.4  0.3 - 1.2 mg/dL   Bilirubin,  Direct 0.1  0.0 - 0.3 mg/dL   Indirect Bilirubin 0.3  0.3 - 0.9 mg/dL  CBC     Status: Abnormal   Collection Time   03/03/12  1:15 PM      Component Value Range   WBC 15.0 (*) 4.0 - 10.5 K/uL   RBC 3.14 (*) 3.87 - 5.11 MIL/uL   Hemoglobin 9.4 (*) 12.0 - 15.0 g/dL   HCT 29.5 (*) 62.1 - 30.8 %   MCV 86.9  78.0 - 100.0 fL   MCH 29.9  26.0 - 34.0 pg   MCHC 34.4  30.0 - 36.0 g/dL   RDW 65.7  84.6 - 96.2 %   Platelets 91 (*) 150 - 400 K/uL  CBC     Status: Abnormal   Collection Time   03/04/12  3:05 AM      Component Value Range   WBC 13.9 (*) 4.0 - 10.5 K/uL   RBC 2.78 (*) 3.87 - 5.11 MIL/uL   Hemoglobin 8.3 (*) 12.0 - 15.0 g/dL   HCT 95.2 (*) 84.1 - 32.4 %   MCV 87.1  78.0 - 100.0 fL   MCH 29.9  26.0 - 34.0 pg   MCHC 34.3  30.0 - 36.0 g/dL   RDW 40.1  02.7 - 25.3 %   Platelets 115 (*) 150 - 400 K/uL  BASIC METABOLIC PANEL     Status: Abnormal   Collection Time   03/04/12  3:05 AM      Component Value Range   Sodium 140  135 - 145 mEq/L   Potassium 3.6  3.5 - 5.1 mEq/L   Chloride 112  96 - 112 mEq/L   CO2 22  19 - 32 mEq/L   Glucose, Bld 80  70 - 99 mg/dL   BUN 22  6 - 23 mg/dL   Creatinine, Ser 6.64 (*) 0.50 - 1.10 mg/dL   Calcium 8.0 (*) 8.4 - 10.5 mg/dL   GFR calc non Af Amer 42 (*) >90 mL/min   GFR calc Af Denyse Dago  48 (*) >90 mL/min  PHOSPHORUS     Status: Normal   Collection Time   03/04/12  3:05 AM      Component Value Range   Phosphorus 2.6  2.3 - 4.6 mg/dL  MAGNESIUM     Status: Normal   Collection Time   03/04/12  3:05 AM      Component Value Range   Magnesium 1.6  1.5 - 2.5 mg/dL  APTT     Status: Normal   Collection Time   03/04/12  3:05 AM      Component Value Range   aPTT 33  24 - 37 seconds  PROTIME-INR     Status: Normal   Collection Time   03/04/12  3:05 AM      Component Value Range   Prothrombin Time 14.4  11.6 - 15.2 seconds   INR 1.14  0.00 - 1.49       Assessment  71 year old female with PMHx of Diverticulitis s/p hemicolectomy who  presents with bright red blood per rectum:   1) GI bleeding- Pt reported 5 episodes of BRPBR last night.   - CBC 9.4-->8.3.  Received 2 u PRBC on 10/19 (4 units total from admission), FFP overnight.     -Hgb relatively stable, if begins bleeding again plan appears to have IR see again for re-embolization.  Gen  surgery also following.CCM currently primary, appreciate all help.  -Continue holding ASA.    - Zofran for nausea and Pepcid for GI PPx.  2) Hypotension-  - Pressures improved this morning.   - Continue IVF  -Continue holding home BP meds.   3) Hypothyroidism- continue home synthroid.   4) FEN- NS @ 100 cc/hr.  Bolus NS depending on pressure.  5) DVT PPX- SCD's (no anticoagulation due to bleeding)  6) Disposition- Under PCCM care, currently deciding whether surgical vs IR intervention warranted.    Code Status: Full   Beth Coombe, DO

## 2012-03-04 NOTE — Progress Notes (Signed)
FMTS Attending Note Patient seen by me and discussed with resident team.  Events of last 24 hours reviewed.  Will continue to follow along.  [name], MD

## 2012-03-04 NOTE — Progress Notes (Signed)
[facility] Progress Note  Subjective: The patient saw a little blood with bowel movement but thinks it's decreasing.  Objective: Vital signs in last 24 hours: Temp:  [97.9 F (36.6 C)-99.6 F (37.6 C)] 98.6 F (37 C) (10/20 0800) Pulse Rate:  [33-141] 85  (10/20 1000) Resp:  [16-22] 22  (10/20 0800) BP: (94-145)/(29-79) 135/79 mmHg (10/20 1000) SpO2:  [94 %-100 %] 100 % (10/20 1000) Weight:  [55.6 kg (122 lb 9.2 oz)] 55.6 kg (122 lb 9.2 oz) (10/20 0500) Weight change: 3.6 kg (7 lb 15 oz)   PE :abdomen is soft  Lab Results: Results for orders placed during the hospital encounter of 02/28/12 (from the past 24 hour(s))  PROTIME-INR     Status: Normal   Collection Time   03/03/12  1:15 PM      Component Value Range   Prothrombin Time 14.2  11.6 - 15.2 seconds   INR 1.11  0.00 - 1.49  APTT     Status: Normal   Collection Time   03/03/12  1:15 PM      Component Value Range   aPTT 27  24 - 37 seconds  HEPATIC FUNCTION PANEL     Status: Abnormal   Collection Time   03/03/12  1:15 PM      Component Value Range   Total Protein 4.1 (*) 6.0 - 8.3 g/dL   Albumin 2.1 (*) 3.5 - 5.2 g/dL   AST 20  0 - 37 U/L   ALT 10  0 - 35 U/L   Alkaline Phosphatase 28 (*) 39 - 117 U/L   Total Bilirubin 0.4  0.3 - 1.2 mg/dL   Bilirubin, Direct 0.1  0.0 - 0.3 mg/dL   Indirect Bilirubin 0.3  0.3 - 0.9 mg/dL  CBC     Status: Abnormal   Collection Time   03/03/12  1:15 PM      Component Value Range   WBC 15.0 (*) 4.0 - 10.5 K/uL   RBC 3.14 (*) 3.87 - 5.11 MIL/uL   Hemoglobin 9.4 (*) 12.0 - 15.0 g/dL   HCT 04.5 (*) 40.9 - 81.1 %   MCV 86.9  78.0 - 100.0 fL   MCH 29.9  26.0 - 34.0 pg   MCHC 34.4  30.0 - 36.0 g/dL   RDW 91.4  78.2 - 95.6 %   Platelets 91 (*) 150 - 400 K/uL  CBC     Status: Abnormal   Collection Time   03/04/12  3:05 AM      Component Value Range   WBC 13.9 (*) 4.0 - 10.5 K/uL   RBC 2.78 (*) 3.87 - 5.11 MIL/uL   Hemoglobin 8.3 (*) 12.0 - 15.0 g/dL   HCT 21.3  (*) 08.6 - 46.0 %   MCV 87.1  78.0 - 100.0 fL   MCH 29.9  26.0 - 34.0 pg   MCHC 34.3  30.0 - 36.0 g/dL   RDW 57.8  46.9 - 62.9 %   Platelets 115 (*) 150 - 400 K/uL  BASIC METABOLIC PANEL     Status: Abnormal   Collection Time   03/04/12  3:05 AM      Component Value Range   Sodium 140  135 - 145 mEq/L   Potassium 3.6  3.5 - 5.1 mEq/L   Chloride 112  96 - 112 mEq/L   CO2 22  19 - 32 mEq/L   Glucose, Bld 80  70 - 99 mg/dL   BUN 22  6 -  23 mg/dL   Creatinine, Ser 1.61 (*) 0.50 - 1.10 mg/dL   Calcium 8.0 (*) 8.4 - 10.5 mg/dL   GFR calc non Af Amer 42 (*) >90 mL/min   GFR calc Af Amer 48 (*) >90 mL/min  PHOSPHORUS     Status: Normal   Collection Time   03/04/12  3:05 AM      Component Value Range   Phosphorus 2.6  2.3 - 4.6 mg/dL  MAGNESIUM     Status: Normal   Collection Time   03/04/12  3:05 AM      Component Value Range   Magnesium 1.6  1.5 - 2.5 mg/dL  APTT     Status: Normal   Collection Time   03/04/12  3:05 AM      Component Value Range   aPTT 33  24 - 37 seconds  PROTIME-INR     Status: Normal   Collection Time   03/04/12  3:05 AM      Component Value Range   Prothrombin Time 14.4  11.6 - 15.2 seconds   INR 1.14  0.00 - 1.49  CBC     Status: Abnormal   Collection Time   03/04/12  9:30 AM      Component Value Range   WBC 14.3 (*) 4.0 - 10.5 K/uL   RBC 2.88 (*) 3.87 - 5.11 MIL/uL   Hemoglobin 8.7 (*) 12.0 - 15.0 g/dL   HCT 09.6 (*) 04.5 - 40.9 %   MCV 87.2  78.0 - 100.0 fL   MCH 30.2  26.0 - 34.0 pg   MCHC 34.7  30.0 - 36.0 g/dL   RDW 81.1  91.4 - 78.2 %   Platelets 120 (*) 150 - 400 K/uL    Studies/Results: @[id number] @    Assessment: Lower GI bleed.  Pandiverticulosis  Plan: If there is further significant bleeding we will need to do another angiogram with attempt at embolization. In the meanwhile continue supportive care.    Idonna Heeren F 03/04/2012, 11:48 AM

## 2012-03-04 NOTE — Progress Notes (Signed)
Pt profile:71 AAF with a hx of extensive bowel surgeries , diverticulosis, who was admitted 10-15 with GIB. Despite colonoscopy and IR attempted intervention she was moved to ICU 10-19 with hypotension secondary to GIB. [facility] is following as is GI services. PCCM consulted 10-19 due to hypotension   Subj: No new complaints. No distress. HDly stable. Wishes to eat   Obj: Filed Vitals:   03/04/12 1200  BP: 136/34  Pulse: 83  Temp: 99.1 F (37.3 C)  Resp:     Gen: NAD HEENT: WNL Neck:  No JVD Chest: Clear Cardiac: RRR Abd: NABS, soft Ext: No edema, warm  BMET    Component Value Date/Time   NA 140 03/04/2012 0305   K 3.6 03/04/2012 0305   CL 112 03/04/2012 0305   CO2 22 03/04/2012 0305   GLUCOSE 80 03/04/2012 0305   BUN 22 03/04/2012 0305   CREATININE 1.25* 03/04/2012 0305   CREATININE 1.09 08/29/2011 1001   CALCIUM 8.0* 03/04/2012 0305   GFRNONAA 42* 03/04/2012 0305   GFRAA 48* 03/04/2012 0305    CBC    Component Value Date/Time   WBC 14.3* 03/04/2012 0930   RBC 2.88* 03/04/2012 0930   HGB 8.7* 03/04/2012 0930   HCT 25.1* 03/04/2012 0930   PLT 120* 03/04/2012 0930   MCV 87.2 03/04/2012 0930   MCH 30.2 03/04/2012 0930   MCHC 34.7 03/04/2012 0930   RDW 14.5 03/04/2012 0930   LYMPHSABS 1.2 02/28/2012 1014   MONOABS 0.3 02/28/2012 1014   EOSABS 0.1 02/28/2012 1014   BASOSABS 0.1 02/28/2012 1014    CXR: no new film   IMPRESSION: Principal Problem:  *Acute blood loss anemia Active Problems:  GI bleed  Hypotension due to blood loss  HYPOTHYROIDISM, UNSPECIFIED  HYPERTENSION, BENIGN SYSTEMIC  GERD  DISC: fully resuscitated. No evidence of active bleeding @ present  PLAN/RECS:  OK to transfer to SDU from PCCM perspective Cont serial CBCs Begin nutrition if OK with GI/CCS Once out of ICU, FPTS to resume primary service duties   Billy Fischer, MD ; [facility] service Mobile [phone].  After 5:30 PM or weekends, call [phone]

## 2012-03-04 NOTE — Evaluation (Signed)
Physical Therapy Evaluation Patient Details Name: Beth Harper MRN: [id number] DOB: Jun 27, 1940 Today's Date: 03/04/2012 Time: 6578-4696 PT Time Calculation (min): 49 min  PT Assessment / Plan / Recommendation Clinical Impression  Patient is a 71 yo female admitted with GIB.  Patient developed confusion over past several days.  Patient with decreased balance with gait, and impulsive/decreased safety with mobility.  At this point, would recommend 24 hour assistance at discharge (if cognition does not clear).  Patient will benefit from acute PT to maximize independence prior to discharge.    PT Assessment  Patient needs continued PT services    Follow Up Recommendations  Post acute inpatient;Supervision/Assistance - 24 hour (If cognition does not clear)    Does the patient have the potential to tolerate intense rehabilitation   No, Recommend SNF  Barriers to Discharge Decreased caregiver support      Equipment Recommendations  Rolling walker with 5" wheels    Recommendations for Other Services     Frequency Min 3X/week    Precautions / Restrictions Precautions Precautions: Fall Precaution Comments: Patient impulsive Restrictions Weight Bearing Restrictions: No   Pertinent Vitals/Pain       Mobility  Bed Mobility Bed Mobility: Supine to Sit;Sitting - Scoot to Edge of Bed Supine to Sit: 4: Min guard;With rails Sitting - Scoot to Edge of Bed: 4: Min guard;With rail Details for Bed Mobility Assistance: Cues to move more slowly for safety. Transfers Transfers: Sit to Stand;Stand to Sit Sit to Stand: 4: Min assist;With upper extremity assist;From bed Stand to Sit: 4: Min assist;With upper extremity assist;With armrests;To chair/3-in-1 Details for Transfer Assistance: Verbal cues for hand placement.  Cues to move slowly for safety.  Assist for balance Ambulation/Gait Ambulation/Gait Assistance: 4: Min assist Ambulation Distance (Feet): 92 Feet Assistive device:  None Ambulation/Gait Assistance Details: Patient with decreased balance during gait - staggering x3 requiring assist to maintain balance.  Gait Pattern: Step-through pattern;Decreased stride length;Narrow base of support Gait velocity: Slow gait speed           PT Diagnosis: Abnormality of gait;Generalized weakness;Altered mental status  PT Problem List: Decreased strength;Decreased activity tolerance;Decreased balance;Decreased mobility;Decreased cognition;Decreased knowledge of use of DME;Decreased safety awareness PT Treatment Interventions: DME instruction;Gait training;Functional mobility training;Balance training;Neuromuscular re-education;Cognitive remediation;Patient/family education   PT Goals Acute Rehab PT Goals PT Goal Formulation: With patient/family Time For Goal Achievement: 03/11/12 Potential to Achieve Goals: Good Pt will go Supine/Side to Sit: Independently;with HOB 0 degrees PT Goal: Supine/Side to Sit - Progress: Goal set today Pt will go Sit to Supine/Side: Independently;with HOB 0 degrees PT Goal: Sit to Supine/Side - Progress: Goal set today Pt will go Sit to Stand: with supervision;with upper extremity assist PT Goal: Sit to Stand - Progress: Goal set today Pt will go Stand to Sit: with supervision;with upper extremity assist PT Goal: Stand to Sit - Progress: Goal set today Pt will Ambulate: >150 feet;with supervision;with rolling walker PT Goal: Ambulate - Progress: Goal set today  Visit Information  Last PT Received On: 03/04/12 Assistance Needed: +1    Subjective Data  Subjective: "I don't know what day I thought it was" Patient Stated Goal: To return home with son.  To improve balance   Prior Functioning  Home Living Lives With: Son Available Help at Discharge: Family;Available PRN/intermittently (Son works) Type of Home: House Home Access: Level entry Home Layout: Two level;Able to live on main level with bedroom/bathroom Bathroom Shower/Tub:  Health visitor: Standard Home Adaptive Equipment: Shower chair with  back;Hand-held shower hose Prior Function Level of Independence: Independent Able to Take Stairs?: Yes Driving: Yes Vocation: Retired Musician: No difficulties    Cognition  Overall Cognitive Status: Impaired Area of Impairment: Attention;Memory;Safety/judgement;Awareness of errors;Awareness of deficits;Problem solving Arousal/Alertness: Awake/alert Orientation Level: Disoriented to;Time;Situation Behavior During Session: Restless Current Attention Level: Sustained Attention - Other Comments: Easily distracted by things/noises in environment.  Needs redirection to task Memory Deficits: Patient with difficulty answering questions about living situation.  Safety/Judgement: Decreased safety judgement for tasks assessed;Impulsive;Decreased awareness of need for assistance Safety/Judgement - Other Comments: Patient attempting to climb over bed rail - PT redirected. Awareness of Errors: Assistance required to identify errors made;Assistance required to correct errors made Awareness of Errors - Other Comments: Patient providing incorrect date/day - needed assist to correct. Awareness of Deficits: Aware of GIB.  Not always aware of confusion    Extremity/Trunk Assessment Right Upper Extremity Assessment RUE ROM/Strength/Tone: [facility] for tasks assessed Left Upper Extremity Assessment LUE ROM/Strength/Tone: WFL for tasks assessed Right Lower Extremity Assessment RLE ROM/Strength/Tone: Deficits RLE ROM/Strength/Tone Deficits: Strength 4/5 RLE Sensation: WFL - Light Touch Left Lower Extremity Assessment LLE ROM/Strength/Tone: Deficits LLE ROM/Strength/Tone Deficits: Strength 4/5 LLE Sensation: WFL - Light Touch   Balance Balance Balance Assessed: Yes Static Sitting Balance Static Sitting - Balance Support: No upper extremity supported;Feet supported Static Sitting - Level of Assistance:  5: Stand by assistance Static Sitting - Comment/# of Minutes: Able to maintain sitting balance x 5 minutes with standby assist. High Level Balance High Level Balance Activites: Turns;Sudden stops;Head turns High Level Balance Comments: Patient with loss of balance during high level balance activities.  End of Session PT - End of Session Equipment Utilized During Treatment: Gait belt Activity Tolerance: Patient limited by fatigue Patient left: in chair;with call bell/phone within reach;with family/visitor present Nurse Communication: Mobility status  GP     Vena Austria 03/04/2012, 5:28 PM Durenda Hurt. Renaldo Fiddler, [facility] Acute Rehab Services Pager [phone]

## 2012-03-04 NOTE — Progress Notes (Signed)
[facility]S/Wendel Homeyer Progress Note 3 Days Post-Op  Subjective: Patient states that she had a bloody bowel movement this morning  Objective: Vital signs in last 24 hours: Temp:  [97.9 F (36.6 C)-99.6 F (37.6 C)] 98.6 F (37 C) (10/20 0800) Pulse Rate:  [33-141] 85  (10/20 1000) Resp:  [16-22] 22  (10/20 0800) BP: (94-145)/(29-79) 135/79 mmHg (10/20 1000) SpO2:  [94 %-100 %] 100 % (10/20 1000) Weight:  [55.6 kg (122 lb 9.2 oz)] 55.6 kg (122 lb 9.2 oz) (10/20 0500) Last BM Date: 03/03/12  Intake/Output from previous day: 10/19 0701 - 10/20 0700 In: 3109.2 [I.V.:1730; Blood:1379.2] Out: 975 [Urine:975] Intake/Output this shift: Total I/O In: 300 [I.V.:250; IV Piggyback:50] Out: -   General: No acute distress  Lungs: Clear  Abd: Soft, nontender.  Extremities: No DVT signs or symptoms.  Neuro: Intact  Lab Results:  @LABLAST2 (wbc:2,hgb:2,hct:2,plt:2) BMET  Basename 03/04/12 0305 03/02/12 1236  NA 140 142  K 3.6 3.8  CL 112 113*  CO2 22 20  GLUCOSE 80 84  BUN 22 15  CREATININE 1.25* 1.11*  CALCIUM 8.0* 7.9*   PT/INR  Basename 03/04/12 0305 03/03/12 1315  LABPROT 14.4 14.2  INR 1.14 1.11   ABG No results found for this basename: PHART:2,PCO2:2,PO2:2,HCO3:2 in the last 72 hours  Studies/Results: Dg Chest Port 1 View  03/03/2012  *RADIOLOGY REPORT*  Clinical Data: Central line placement.  PORTABLE CHEST - 1 VIEW  Comparison: None.  Findings: Right internal jugular central venous catheter projects over the SVC.  Tortuous thoracic aorta noted with mild atherosclerotic calcification.  Heart size is within normal limits for technique.  The lungs appear clear.  No pneumothorax.  IMPRESSION:  1.  Right IJ line tip:  SVC.   Original Report Authenticated By: Dellia Cloud, M.D.     Anti-infectives: Anti-infectives     Start     Dose/Rate Route Frequency Ordered Stop   03/01/12 2145   ceFAZolin (ANCEF) IVPB 1 g/50 mL premix        1 g 100 mL/hr over 30 Minutes  Intravenous  Once 03/01/12 2142 03/01/12 2212          Assessment/Plan: s/p Procedure(s): COLONOSCOPY Hgb up a bit If she continues to bleed or breaks through, will need angioembolization.  LOS: 5 days   Marta Lamas. Gae Bon, MD, FACS [phone] [facility] Surgery 03/04/2012

## 2012-03-05 ENCOUNTER — Encounter (HOSPITAL_COMMUNITY): Payer: Self-pay | Admitting: Surgery

## 2012-03-05 DIAGNOSIS — K219 Gastro-esophageal reflux disease without esophagitis: Secondary | ICD-10-CM

## 2012-03-05 LAB — PREPARE FRESH FROZEN PLASMA: Unit division: 0

## 2012-03-05 LAB — CBC
HCT: 28.1 % — ABNORMAL LOW (ref 36.0–46.0)
Hemoglobin: 9 g/dL — ABNORMAL LOW (ref 12.0–15.0)
Hemoglobin: 9.4 g/dL — ABNORMAL LOW (ref 12.0–15.0)
MCHC: 34.5 g/dL (ref 30.0–36.0)
MCV: 87.3 fL (ref 78.0–100.0)
Platelets: 128 10*3/uL — ABNORMAL LOW (ref 150–400)
RBC: 3.15 MIL/uL — ABNORMAL LOW (ref 3.87–5.11)
RBC: 3.22 MIL/uL — ABNORMAL LOW (ref 3.87–5.11)
RDW: 14.4 % (ref 11.5–15.5)
WBC: 9.1 10*3/uL (ref 4.0–10.5)
WBC: 8.2 10*3/uL (ref 4.0–10.5)

## 2012-03-05 LAB — BASIC METABOLIC PANEL
GFR calc Af Amer: 57 mL/min — ABNORMAL LOW (ref >90)
GFR calc non Af Amer: 49 mL/min — ABNORMAL LOW (ref >90)
Potassium: 3.6 mEq/L (ref 3.5–5.1)
Sodium: 137 mEq/L (ref 135–145)

## 2012-03-05 MED ORDER — POTASSIUM CHLORIDE CRYS ER 20 MEQ PO TBCR
40.0000 meq | EXTENDED_RELEASE_TABLET | Freq: Once | ORAL | Status: AC
  Administered 2012-03-05: 40 meq via ORAL
  Filled 2012-03-05: qty 2

## 2012-03-05 NOTE — Progress Notes (Signed)
Daily Progress Note Family Medicine Teaching Service Service Pager: [phone]   Subjective: Feels well the morning.  Has not had BM over the past 24 hours.  No further blood seen.  Denies abdominal pain or nausea.  Eating breakfast this morning.  Objective:  VITALS Temp:  [98 F (36.7 C)-99.1 F (37.3 C)] 98 F (36.7 C) (10/21 0000) Pulse Rate:  [79-91] 82  (10/20 1800) Resp:  [16-22] 16  (10/21 0000) BP: (116-147)/(34-105) 147/83 mmHg (10/21 0500) SpO2:  [97 %-100 %] 100 % (10/21 0000) Weight:  [125 lb 3.5 oz (56.8 kg)] 125 lb 3.5 oz (56.8 kg) (10/21 0500)  In/Out  Intake/Output Summary (Last 24 hours) at 03/05/12 0619 Last data filed at 03/05/12 0500  Gross per 24 hour  Intake   2425 ml  Output   2925 ml  Net   -500 ml    Physical Exam: Physical Exam  BP 129/77  Pulse 82  Temp 98.3 F (36.8 C) (Oral)  Resp 16  Ht 5\' 1"  (1.549 m)  Wt 125 lb 3.5 oz (56.8 kg)  BMI 23.66 kg/m2  SpO2 98%  General appearance: Slightly agitated Neck: no JVD and supple, symmetrical, trachea midline  Lungs: clear to auscultation bilaterally  Heart: Tachycardic, S1, S2 normal, no murmur, click, rub or gallop  Abdomen: soft, non-tender; bowel sounds normal; no masses, no organomegaly  Extremities: extremities normal, atraumatic, no cyanosis or edema  Pulses: 2+ and symmetric   MEDS Scheduled Meds:    . famotidine (PEPCID) IV  20 mg Intravenous Q12H  . levothyroxine  50 mcg Intravenous Daily  . sodium chloride  3 mL Intravenous Q12H  . DISCONTD: sodium chloride   Intravenous Once   Continuous Infusions:    . dextrose 5 % and 0.45 % NaCl with KCl 20 mEq/L 1,000 mL (03/05/12 0058)  . DISCONTD: sodium chloride 10 mL/hr at 03/03/12 1630   PRN Meds:.acetaminophen, acetaminophen, fentaNYL, ondansetron (ZOFRAN) IV, ondansetron, DISCONTD: fentaNYL, DISCONTD: fentaNYL  Labs and imaging:   CBC  Lab 03/05/12 0500 03/04/12 1627 03/04/12 0930  WBC 12.2* 14.5* 14.3*  HGB 9.0* 9.1*  8.7*  HCT 26.1* 26.9* 25.1*  PLT 128* 132* 120*   BMET/CMET  Lab 03/05/12 0500 03/04/12 0305 03/03/12 1315 03/02/12 1236 02/28/12 1014  NA 137 140 -- 142 --  K 3.6 3.6 -- 3.8 --  CL 110 112 -- 113* --  CO2 23 22 -- 20 --  BUN 14 22 -- 15 --  CREATININE 1.09 1.25* -- 1.11* --  CALCIUM 8.1* 8.0* -- 7.9* --  PROT -- -- 4.1* -- 6.7  BILITOT -- -- 0.4 -- 0.5  ALKPHOS -- -- 28* -- 31*  ALT -- -- 10 -- 7  AST -- -- 20 -- 14  GLUCOSE 100* 80 -- 84 --   Results for orders placed during the hospital encounter of 02/28/12 (from the past 24 hour(s))  CBC     Status: Abnormal   Collection Time   03/04/12  9:30 AM      Component Value Range   WBC 14.3 (*) 4.0 - 10.5 K/uL   RBC 2.88 (*) 3.87 - 5.11 MIL/uL   Hemoglobin 8.7 (*) 12.0 - 15.0 g/dL   HCT 21.3 (*) 08.6 - 57.8 %   MCV 87.2  78.0 - 100.0 fL   MCH 30.2  26.0 - 34.0 pg   MCHC 34.7  30.0 - 36.0 g/dL   RDW 46.9  62.9 - 52.8 %   Platelets 120 (*)  150 - 400 K/uL  CBC     Status: Abnormal   Collection Time   03/04/12  4:27 PM      Component Value Range   WBC 14.5 (*) 4.0 - 10.5 K/uL   RBC 3.08 (*) 3.87 - 5.11 MIL/uL   Hemoglobin 9.1 (*) 12.0 - 15.0 g/dL   HCT 16.1 (*) 09.6 - 04.5 %   MCV 87.3  78.0 - 100.0 fL   MCH 29.5  26.0 - 34.0 pg   MCHC 33.8  30.0 - 36.0 g/dL   RDW 40.9  81.1 - 91.4 %   Platelets 132 (*) 150 - 400 K/uL  CBC     Status: Abnormal   Collection Time   03/05/12  5:00 AM      Component Value Range   WBC 12.2 (*) 4.0 - 10.5 K/uL   RBC 2.99 (*) 3.87 - 5.11 MIL/uL   Hemoglobin 9.0 (*) 12.0 - 15.0 g/dL   HCT 78.2 (*) 95.6 - 21.3 %   MCV 87.3  78.0 - 100.0 fL   MCH 30.1  26.0 - 34.0 pg   MCHC 34.5  30.0 - 36.0 g/dL   RDW 08.6  57.8 - 46.9 %   Platelets 128 (*) 150 - 400 K/uL  BASIC METABOLIC PANEL     Status: Abnormal   Collection Time   03/05/12  5:00 AM      Component Value Range   Sodium 137  135 - 145 mEq/L   Potassium 3.6  3.5 - 5.1 mEq/L   Chloride 110  96 - 112 mEq/L   CO2 23  19 - 32 mEq/L    Glucose, Bld 100 (*) 70 - 99 mg/dL   BUN 14  6 - 23 mg/dL   Creatinine, Ser 6.29  0.50 - 1.10 mg/dL   Calcium 8.1 (*) 8.4 - 10.5 mg/dL   GFR calc non Af Amer 49 (*) >90 mL/min   GFR calc Af Amer 57 (*) >90 mL/min       Assessment  71 year old female with PMHx of Diverticulitis s/p hemicolectomy who presents with bright red blood per rectum:   1) GI bleeding- Pt reported 5 episodes of BRPBR last night.   - CBC stable at around 9.  Received 2 u PRBC on 10/19 (4 units total from admission), FFP overnight on  10/20     -Hgb relatively stable, if begins bleeding again plan appears to have IR see again for re-embolization.  Gen  surgery also following.   -Continue holding ASA.    - Zofran for nausea and Pepcid for GI PPx.  2) Hypotension-  - Pressures stable, actually starting to become a little hypertensive (systolic in the 140's), monitor closely  - Continue IVF  -Continue holding home BP meds, can begin to add back on slowly this afternoon or tonight if pressure still  stable  3) Hypothyroidism- continue home synthroid.   4) FEN- NS @ 100 cc/hr.  5) DVT PPX- SCD's (no anticoagulation due to bleeding)  6) Disposition- Likely to transfer to SDU today and back to our service.  Code Status: Full   Everrett Coombe, DO

## 2012-03-05 NOTE — Evaluation (Signed)
Occupational Therapy Evaluation Patient Details Name: Beth Harper MRN: [id number] DOB: 1940-09-30 Today's Date: 03/05/2012 Time: 4098-1191 OT Time Calculation (min): 22 min  OT Assessment / Plan / Recommendation Clinical Impression  This 71 y.o. female admitted for GIB due to diverticulitis.  Pt. demonstrates the below listed deficits and will benefit from OT to maximize safety and independence with BADLs to allow her to return home at supervision to modified independent level    OT Assessment  Patient needs continued OT Services    Follow Up Recommendations  No OT follow up;Supervision/Assistance - 24 hour;Supervision - Intermittent    Barriers to Discharge None    Equipment Recommendations  Tub/shower seat    Recommendations for Other Services    Frequency  Min 2X/week    Precautions / Restrictions Precautions Precautions: Fall Restrictions Weight Bearing Restrictions: No       ADL  Eating/Feeding: Independent Where Assessed - Eating/Feeding: Chair Grooming: Wash/dry hands;Min guard Where Assessed - Grooming: Unsupported standing Upper Body Bathing: Set up Where Assessed - Upper Body Bathing: Supported sitting Lower Body Bathing: Min guard Where Assessed - Lower Body Bathing: Unsupported sit to stand Upper Body Dressing: Minimal assistance (due to fatigue) Where Assessed - Upper Body Dressing: Unsupported sitting Lower Body Dressing: Minimal assistance (due to fatigue) Where Assessed - Lower Body Dressing: Unsupported sit to stand Toilet Transfer: Min Pension scheme manager Method: Sit to Barista: Comfort height toilet Toileting - Architect and Hygiene: Min guard Where Assessed - Toileting Clothing Manipulation and Hygiene: Standing Transfers/Ambulation Related to ADLs: ambulates in room with min guard assist ADL Comments: Pt. reports feeling woozy after ambulating to BR,but BP stable    OT Diagnosis: Generalized weakness    OT Problem List: Decreased strength;Decreased activity tolerance;Impaired balance (sitting and/or standing);Decreased knowledge of use of DME or AE OT Treatment Interventions: Self-care/ADL training;DME and/or AE instruction;Therapeutic activities;Patient/family education;Balance training   OT Goals Acute Rehab OT Goals OT Goal Formulation: With patient Time For Goal Achievement: 03/19/12 Potential to Achieve Goals: Good ADL Goals Pt Will Perform Grooming: with modified independence;Standing at sink ADL Goal: Grooming - Progress: Goal set today Pt Will Perform Upper Body Bathing: with modified independence;Sitting, edge of bed;Sitting, chair ADL Goal: Upper Body Bathing - Progress: Goal set today Pt Will Perform Lower Body Bathing: with modified independence;Sit to stand from chair;Sit to stand from bed ADL Goal: Lower Body Bathing - Progress: Goal set today Pt Will Perform Upper Body Dressing: with modified independence;Sitting, chair;Sitting, bed ADL Goal: Upper Body Dressing - Progress: Goal set today Pt Will Perform Lower Body Dressing: with modified independence;Sit to stand from chair;Sit to stand from bed ADL Goal: Lower Body Dressing - Progress: Goal set today Pt Will Transfer to Toilet: with modified independence;Ambulation;Comfort height toilet ADL Goal: Toilet Transfer - Progress: Goal set today Pt Will Perform Toileting - Clothing Manipulation: with modified independence;Standing ADL Goal: Toileting - Clothing Manipulation - Progress: Goal set today Pt Will Perform Tub/Shower Transfer: Shower transfer;with modified independence;Ambulation;Shower seat with back ADL Goal: Tub/Shower Transfer - Progress: Goal set today  Visit Information  Last OT Received On: 03/05/12 Assistance Needed: +1    Subjective Data  Subjective: "I feel like if I try to do much, I might fall out" Patient Stated Goal: To go home   Prior Functioning     Home Living Lives With: Son (and 51 y.o.  granddtr) Available Help at Discharge: Family;Available PRN/intermittently;Friend(s) (Pt. reports she will have assist as much as needed) Type  of Home: House Home Access: Level entry Home Layout: Two level;Able to live on main level with bedroom/bathroom Bathroom Shower/Tub: Health visitor: Standard Home Adaptive Equipment: Hand-held shower hose Prior Function Level of Independence: Independent Able to Take Stairs?: Yes Driving: Yes Vocation: Retired Musician: No difficulties Dominant Hand: Right         Vision/Perception     Cognition  Overall Cognitive Status: Appears within functional limits for tasks assessed/performed Arousal/Alertness: Awake/alert Orientation Level: Appears intact for tasks assessed Behavior During Session: [facility] for tasks performed Current Attention Level: Alternating    Extremity/Trunk Assessment Right Upper Extremity Assessment RUE ROM/Strength/Tone: WFL for tasks assessed RUE Coordination: WFL - gross/fine motor Left Upper Extremity Assessment LUE ROM/Strength/Tone: WFL for tasks assessed LUE Coordination: WFL - gross/fine motor     Mobility Bed Mobility Bed Mobility: Supine to Sit;Sitting - Scoot to Edge of Bed Supine to Sit: 6: Modified independent (Device/Increase time) Sitting - Scoot to Edge of Bed: 6: Modified independent (Device/Increase time) Transfers Transfers: Sit to Stand;Stand to Sit Sit to Stand: 4: Min guard;From bed Stand to Sit: 4: Min guard;To chair/3-in-1;To toilet;To bed Details for Transfer Assistance: Pt. slightly unsteady     Shoulder Instructions     Exercise     Balance Balance Balance Assessed: Yes Static Sitting Balance Static Sitting - Balance Support: No upper extremity supported;Feet supported Static Sitting - Level of Assistance: 7: Independent   End of Session OT - End of Session Activity Tolerance: Patient limited by fatigue Patient left: in chair;with call  bell/phone within reach Nurse Communication: Other (comment) (BP readings and orthostatic BP)  GO     Tamarick Kovalcik M 03/05/2012, 3:25 PM

## 2012-03-05 NOTE — Progress Notes (Signed)
Orthostatics  Lying 128/81,  Sitting 134/78  ,  Standing 130/77  pt went to the bathroom and had another red bm.  States she is dizzy.  Rechecked blood pressure and 141/84 Heart rate remained in the 80's with orthostatics.

## 2012-03-05 NOTE — Progress Notes (Signed)
4 Days Post-Op  Subjective: Patient appears comfortable, her son is at bedside, patient states that " she wants to go home!" tolerating diet.  Objective: Vital signs in last 24 hours: Temp:  [98 F (36.7 C)-99.1 F (37.3 C)] 98.3 F (36.8 C) (10/21 0728) Pulse Rate:  [79-91] 82  (10/20 1800) Resp:  [16] 16  (10/21 0500) BP: (116-147)/(34-105) 129/77 mmHg (10/21 0800) SpO2:  [97 %-100 %] 98 % (10/21 0728) Weight:  [125 lb 3.5 oz (56.8 kg)] 125 lb 3.5 oz (56.8 kg) (10/21 0500) Last BM Date: 03/03/12  Intake/Output from previous day: 10/20 0701 - 10/21 0700 In: 2400 [P.O.:600; I.V.:1750; IV Piggyback:50] Out: 2925 [Urine:2925] Intake/Output this shift:    General appearance: alert, cooperative, appears stated age and no distress Chest: CTA bilaterally Cardiac: RRR Abdomen: soft, non tender, + BS No further "bloody BM's " no bloating. WBC wnl, H&H remains stable, VSS, afebrile.  Lab Results:   Basename 03/05/12 0500 03/04/12 1627  WBC 12.2* 14.5*  HGB 9.0* 9.1*  HCT 26.1* 26.9*  PLT 128* 132*   BMET  Basename 03/05/12 0500 03/04/12 0305  NA 137 140  K 3.6 3.6  CL 110 112  CO2 23 22  GLUCOSE 100* 80  BUN 14 22  CREATININE 1.09 1.25*  CALCIUM 8.1* 8.0*   PT/INR  Basename 03/04/12 0305 03/03/12 1315  LABPROT 14.4 14.2  INR 1.14 1.11   ABG No results found for this basename: PHART:2,PCO2:2,PO2:2,HCO3:2 in the last 72 hours  Studies/Results: Dg Chest Port 1 View  03/03/2012  *RADIOLOGY REPORT*  Clinical Data: Central line placement.  PORTABLE CHEST - 1 VIEW  Comparison: None.  Findings: Right internal jugular central venous catheter projects over the SVC.  Tortuous thoracic aorta noted with mild atherosclerotic calcification.  Heart size is within normal limits for technique.  The lungs appear clear.  No pneumothorax.  IMPRESSION:  1.  Right IJ line tip:  SVC.   Original Report Authenticated By: Dellia Cloud, M.D.     Anti-infectives: Anti-infectives       Start     Dose/Rate Route Frequency Ordered Stop   03/01/12 2145   ceFAZolin (ANCEF) IVPB 1 g/50 mL premix        1 g 100 mL/hr over 30 Minutes Intravenous  Once 03/01/12 2142 03/01/12 2212          Assessment/Plan: Patient Active Problem List  Diagnosis  . HYPOTHYROIDISM, UNSPECIFIED  . HYPERLIPIDEMIA  . HYPERTENSION, BENIGN SYSTEMIC  . GERD  . ROTATOR CUFF SYNDROME  . Weight loss  . Hyperlipidemia  . GI bleed  . Acute blood loss anemia  . Hypotension due to blood loss   s/p Procedure(s) (LRB) with comments: COLONOSCOPY (N/A) - Pat 1. Will advance diet 2. ? Transfer to step down floor as patient appears stable. 3. Continue to monitor H&H; plan for angio embolization if bleed ng re-occurs per GI.   LOS: 6 days    [name] [facility] Surgery Pager # [phone]  03/05/2012

## 2012-03-05 NOTE — Progress Notes (Signed)
On and off brpr and c/o dizziness  Orthostatics normal per rn   Lab 03/05/12 1450 03/05/12 0500 03/04/12 1627  HGB 9.4* 9.0* 9.1*  HCT 27.6* 26.1* 26.9*  WBC 9.1 12.2* 14.5*  PLT 141* 128* 132*    Hgb without change  Will move patient to sdu instead of floor  Check cbc q6h

## 2012-03-05 NOTE — Progress Notes (Addendum)
As per note of JD,MP. She is starting on a solid diet, having no pain, abd is benign, and HGB is stable  Her prior surgery in 2002 was Left hemicolectomy for diverticular obstruction. She had Hartmann and subsequent closure by Dr Magnus Ivan

## 2012-03-05 NOTE — Progress Notes (Signed)
Subjective: No further bleeding. Blood pressures ok. Tolerating regular diet.  Objective: Vital signs in last 24 hours: Temp:  [98 F (36.7 C)-99.1 F (37.3 C)] 98.3 F (36.8 C) (10/21 0728) Pulse Rate:  [79-91] 82  (10/20 1800) Resp:  [16] 16  (10/21 0500) BP: (116-147)/(34-105) 129/77 mmHg (10/21 0800) SpO2:  [97 %-100 %] 98 % (10/21 0728) Weight:  [56.8 kg (125 lb 3.5 oz)] 56.8 kg (125 lb 3.5 oz) (10/21 0500) Weight change: 1.2 kg (2 lb 10.3 oz) Last BM Date: 03/03/12  PE: GEN:  NAD, younger-appearing than stated age  Lab Results: CBC    Component Value Date/Time   WBC 12.2* 03/05/2012 0500   RBC 2.99* 03/05/2012 0500   HGB 9.0* 03/05/2012 0500   HCT 26.1* 03/05/2012 0500   PLT 128* 03/05/2012 0500   MCV 87.3 03/05/2012 0500   MCH 30.1 03/05/2012 0500   MCHC 34.5 03/05/2012 0500   RDW 14.4 03/05/2012 0500   LYMPHSABS 1.2 02/28/2012 1014   MONOABS 0.3 02/28/2012 1014   EOSABS 0.1 02/28/2012 1014   BASOSABS 0.1 02/28/2012 1014   Assessment:  1.  Hematochezia, currently quiescent.  Tagged RBC study previously showed bleeding in right colon, but follow-up angiogram was negative. 2.  Pancolonic diverticulosis, likely source of bleeding. 3.  Acute blood loss anemia, likely from #1 and #2 above.  Plan:  1.  Advance diet and supportive care. 2.  OK to de-escalate level-of-care from GI perspective. 3.  Will follow.  If no further bleeding today, will likely be ok to discharge tomorrow from GI perspective.   Beth Harper 03/05/2012, 10:08 AM

## 2012-03-05 NOTE — Progress Notes (Signed)
Pt profile:71 AAF with a hx of extensive bowel surgeries , diverticulosis, who was admitted 10-15 with GIB. Despite colonoscopy and IR attempted intervention she was moved to ICU 10-19 with hypotension secondary to GIB. [facility] is following as is GI services. PCCM consulted 10-19 due to hypotension   10/20: No new complaints. No distress. HDly stable. Wishes to eat    SUBJECTIVE/OVERNIGHT/INTERVAL HX WEll. No bleeding. No complaints. GI feels she is stable for out of ICU tx from their standpoint. Maintaining hemodynamics   Obj: Filed Vitals:   03/05/12 1300  BP: 133/87  Pulse:   Temp:   Resp:     Gen: NAD HEENT: WNL Neck:  No JVD Chest: Clear Cardiac: RRR Abd: NABS, soft Ext: No edema, warm  BMET    Component Value Date/Time   NA 137 03/05/2012 0500   K 3.6 03/05/2012 0500   CL 110 03/05/2012 0500   CO2 23 03/05/2012 0500   GLUCOSE 100* 03/05/2012 0500   BUN 14 03/05/2012 0500   CREATININE 1.09 03/05/2012 0500   CREATININE 1.09 08/29/2011 1001   CALCIUM 8.1* 03/05/2012 0500   GFRNONAA 49* 03/05/2012 0500   GFRAA 57* 03/05/2012 0500    Lab 03/05/12 0500 03/04/12 1627 03/04/12 0930 03/04/12 0305 03/03/12 1315  HGB 9.0* 9.1* 8.7* 8.3* 9.4*     Lab 02/28/12 1014  TROPONINI <0.30   No results found for this basename: PROBNP:5 in the last 168 hours No results found.   CBC    Component Value Date/Time   WBC 12.2* 03/05/2012 0500   RBC 2.99* 03/05/2012 0500   HGB 9.0* 03/05/2012 0500   HCT 26.1* 03/05/2012 0500   PLT 128* 03/05/2012 0500   MCV 87.3 03/05/2012 0500   MCH 30.1 03/05/2012 0500   MCHC 34.5 03/05/2012 0500   RDW 14.4 03/05/2012 0500   LYMPHSABS 1.2 02/28/2012 1014   MONOABS 0.3 02/28/2012 1014   EOSABS 0.1 02/28/2012 1014   BASOSABS 0.1 02/28/2012 1014    CXR: no new film   IMPRESSION: Principal Problem:  *Acute blood loss anemia Active Problems:  HYPOTHYROIDISM, UNSPECIFIED  HYPERTENSION, BENIGN SYSTEMIC  GERD  GI bleed  Hypotension  due to blood loss  DISC: fully resuscitated. No evidence of active bleeding @ present  PLAN/RECS:   transfer to  Regular floor Cont serial CBCs REgular diet per GI/CCS Once out of ICU, FPTS to resume primary service duties frpom 03/06/12 - d/w resident   Dr. Kalman Shan, M.D., [facility].C.P Pulmonary and Critical Care Medicine Staff Physician [facility] Pager: [phone], If no answer or between  15:00h - 7:00h: call [phone]  03/05/2012 2:32 PM

## 2012-03-05 NOTE — Progress Notes (Signed)
I have stopped by to follow up with my primary care patient admitted for lower GI bleed. She has no complaint or concerns and understands the care plan. She has expressed that she would not like complete colectomy with ileostomy. She had an ostomy in the past and would not like another. I agree with and appreciate the excellent care provided by the family medicine teaching service, GI, general surgery and IR. I will continue to follow.   Deshun Sedivy 12:59 PM, 03/05/2012

## 2012-03-05 NOTE — Progress Notes (Signed)
I  discussed the care plan with Dr. Ashley Royalty and the [facility] team and agree with assessment and plan as documented in the progress note for today.  Lakysha Kossman A. Sheffield Slider, MD Family Medicine Teaching Service Attending  03/05/2012 6:52 PM

## 2012-03-06 LAB — BASIC METABOLIC PANEL
BUN: 16 mg/dL (ref 6–23)
CO2: 24 mEq/L (ref 19–32)
Calcium: 8.3 mg/dL — ABNORMAL LOW (ref 8.4–10.5)
GFR calc non Af Amer: 44 mL/min — ABNORMAL LOW (ref >90)
Glucose, Bld: 99 mg/dL (ref 70–99)
Potassium: 4.3 mEq/L (ref 3.5–5.1)

## 2012-03-06 LAB — CBC
HCT: 24.7 % — ABNORMAL LOW (ref 36.0–46.0)
HCT: 26.3 % — ABNORMAL LOW (ref 36.0–46.0)
HCT: 25.3 % — ABNORMAL LOW (ref 36.0–46.0)
Hemoglobin: 8.3 g/dL — ABNORMAL LOW (ref 12.0–15.0)
Hemoglobin: 8.7 g/dL — ABNORMAL LOW (ref 12.0–15.0)
MCH: 29.3 pg (ref 26.0–34.0)
MCH: 28.7 pg (ref 26.0–34.0)
MCHC: 33.6 g/dL (ref 30.0–36.0)
MCHC: 32.8 g/dL (ref 30.0–36.0)
MCV: 87.4 fL (ref 78.0–100.0)
MCV: 87.5 fL (ref 78.0–100.0)
Platelets: 176 10*3/uL (ref 150–400)
RBC: 2.83 MIL/uL — ABNORMAL LOW (ref 3.87–5.11)
RDW: 14.5 % (ref 11.5–15.5)
RDW: 14.4 % (ref 11.5–15.5)
WBC: 6.3 10*3/uL (ref 4.0–10.5)
WBC: 5.9 10*3/uL (ref 4.0–10.5)

## 2012-03-06 NOTE — Progress Notes (Signed)
PGY-1 Daily Progress Note Family Medicine Teaching Service Beth R. Mariaceleste Herrera, DO Service Pager: [phone]   Subjective: Pt did well overnight and has not had BRBPR in the past 24 hours.  Is resting comfortably in bed today and ready to go home.   Objective:  VITALS Temp:  [97.2 F (36.2 C)-98.5 F (36.9 C)] 97.2 F (36.2 C) (10/22 0800) Resp:  [19-20] 20  (10/22 0300) BP: (97-149)/(15-108) 149/96 mmHg (10/22 0800) SpO2:  [99 %] 99 % (10/22 0800)  In/Out  Intake/Output Summary (Last 24 hours) at 03/06/12 1103 Last data filed at 03/05/12 2100  Gross per 24 hour  Intake    650 ml  Output      0 ml  Net    650 ml    Physical Exam: Filed Vitals:   03/06/12 0800  BP: 149/96  Pulse:   Temp: 97.2 F (36.2 C)  Resp:     General appearance: NAD Neck: no JVD and supple, symmetrical, trachea midline  Lungs: clear to auscultation bilaterally  Heart: RRR, S1, S2 normal, no murmur, click, rub or gallop  Abdomen: soft, non-tender; bowel sounds normal; no masses, no organomegaly  Extremities: extremities normal, atraumatic, no cyanosis or edema  Pulses: 2+ and symmetric   MEDS Scheduled Meds:    . famotidine (PEPCID) IV  20 mg Intravenous Q12H  . levothyroxine  50 mcg Intravenous Daily  . potassium chloride  40 mEq Oral Once  . sodium chloride  3 mL Intravenous Q12H   Continuous Infusions:    . DISCONTD: dextrose 5 % and 0.45 % NaCl with KCl 20 mEq/L Stopped (03/05/12 1745)   PRN Meds:.acetaminophen, acetaminophen, fentaNYL, ondansetron (ZOFRAN) IV, ondansetron  Labs and imaging:   CBC  Lab 03/06/12 0256 03/05/12 2100 03/05/12 1450  WBC 6.1 8.2 9.1  HGB 8.3* 9.6* 9.4*  HCT 24.7* 28.1* 27.6*  PLT 134* 144* 141*   BMET/CMET  Lab 03/06/12 0256 03/05/12 0500 03/04/12 0305 03/03/12 1315  NA 140 137 140 --  K 4.3 3.6 3.6 --  CL 111 110 112 --  CO2 24 23 22  --  BUN 16 14 22  --  CREATININE 1.19* 1.09 1.25* --  CALCIUM 8.3* 8.1* 8.0* --  PROT -- -- -- 4.1*    BILITOT -- -- -- 0.4  ALKPHOS -- -- -- 28*  ALT -- -- -- 10  AST -- -- -- 20  GLUCOSE 99 100* 80 --   Results for orders placed during the hospital encounter of 02/28/12 (from the past 24 hour(s))  CBC     Status: Abnormal   Collection Time   03/05/12  2:50 PM      Component Value Range   WBC 9.1  4.0 - 10.5 K/uL   RBC 3.15 (*) 3.87 - 5.11 MIL/uL   Hemoglobin 9.4 (*) 12.0 - 15.0 g/dL   HCT 86.5 (*) 78.4 - 69.6 %   MCV 87.6  78.0 - 100.0 fL   MCH 29.8  26.0 - 34.0 pg   MCHC 34.1  30.0 - 36.0 g/dL   RDW 29.5  28.4 - 13.2 %   Platelets 141 (*) 150 - 400 K/uL  CBC     Status: Abnormal   Collection Time   03/05/12  9:00 PM      Component Value Range   WBC 8.2  4.0 - 10.5 K/uL   RBC 3.22 (*) 3.87 - 5.11 MIL/uL   Hemoglobin 9.6 (*) 12.0 - 15.0 g/dL   HCT 44.0 (*) 10.2 -  46.0 %   MCV 87.3  78.0 - 100.0 fL   MCH 29.8  26.0 - 34.0 pg   MCHC 34.2  30.0 - 36.0 g/dL   RDW 16.1  09.6 - 04.5 %   Platelets 144 (*) 150 - 400 K/uL  CBC     Status: Abnormal   Collection Time   03/06/12  2:56 AM      Component Value Range   WBC 6.1  4.0 - 10.5 K/uL   RBC 2.83 (*) 3.87 - 5.11 MIL/uL   Hemoglobin 8.3 (*) 12.0 - 15.0 g/dL   HCT 40.9 (*) 81.1 - 91.4 %   MCV 87.3  78.0 - 100.0 fL   MCH 29.3  26.0 - 34.0 pg   MCHC 33.6  30.0 - 36.0 g/dL   RDW 78.2  95.6 - 21.3 %   Platelets 134 (*) 150 - 400 K/uL  BASIC METABOLIC PANEL     Status: Abnormal   Collection Time   03/06/12  2:56 AM      Component Value Range   Sodium 140  135 - 145 mEq/L   Potassium 4.3  3.5 - 5.1 mEq/L   Chloride 111  96 - 112 mEq/L   CO2 24  19 - 32 mEq/L   Glucose, Bld 99  70 - 99 mg/dL   BUN 16  6 - 23 mg/dL   Creatinine, Ser 0.86 (*) 0.50 - 1.10 mg/dL   Calcium 8.3 (*) 8.4 - 10.5 mg/dL   GFR calc non Af Amer 44 (*) >90 mL/min   GFR calc Af Amer 51 (*) >90 mL/min  PHOSPHORUS     Status: Normal   Collection Time   03/06/12  2:56 AM      Component Value Range   Phosphorus 2.4  2.3 - 4.6 mg/dL  MAGNESIUM      Status: Normal   Collection Time   03/06/12  2:56 AM      Component Value Range   Magnesium 1.8  1.5 - 2.5 mg/dL       Assessment  71 year old female with PMHx of Diverticulitis s/p hemicolectomy who presents with bright red blood per rectum:   1) GI bleeding- Pt has not had episodes of bloody stool over the last 24 hrs.   1) Transferred back to SDU today from CCM.  Continue to follow serial CBC's.  Hgb 8.3 this AM.      2) Continue to have surgery follow and IR.  Low threshold for taking back for IR embolization if pt reports more episodes of bloody stool or continues to have decrease in her Hgb from 8.3 to around 7.  Low threshold to transfuse as well.  Will get repeat type and screen as last one has expired.  If becomes symptomatic or Hgb continues to drop will repeat transfusion and also talk to surgery/IR.   3) Continue holding ASA.    4) Zofran for nausea and Pepcid for GI PPx.  2) Hypotension-Resolved   1) Resolved s/p multiple transufsions and fluid boluses.   2) Continue to hold BP meds and follow.    3) Hypothyroidism- continue home synthroid.   4) FEN- SLIV 5) DVT PPX- SCD's (no anticoagulation due to bleeding)  6) Disposition- Continue to follow Hgb and Surgery/IR recs regarding her GI bleed.   Code Status: Full   Beth Rode R. Beth Portee, DO of [facility] 03/06/2012, 9:22 AM

## 2012-03-06 NOTE — Progress Notes (Signed)
Patient ID: Beth Harper, female   DOB: 10-02-1940, 71 y.o.   MRN: [id number] [facility] Gastroenterology Progress Note  Beth Harper 71 y.o. 02-25-1941   Subjective: Sitting in chair. No complaints. Brown stool overnight. Hgb 8.7.  Objective: Vital signs in last 24 hours: Filed Vitals:   03/06/12 1200  BP: 129/95  Pulse:   Temp: 97.2  Resp:     Physical Exam: Gen: alert, no acute distress, thin Abd: soft, NT  Lab Results:  Basename 03/06/12 0256 03/05/12 0500 03/04/12 0305  NA 140 137 --  K 4.3 3.6 --  CL 111 110 --  CO2 24 23 --  GLUCOSE 99 100* --  BUN 16 14 --  CREATININE 1.19* 1.09 --  CALCIUM 8.3* 8.1* --  MG 1.8 -- 1.6  PHOS 2.4 -- 2.6   No results found for this basename: AST:2,ALT:2,ALKPHOS:2,BILITOT:2,PROT:2,ALBUMIN:2 in the last 72 hours  Basename 03/06/12 1015 03/06/12 0256  WBC 6.3 6.1  NEUTROABS -- --  HGB 8.7* 8.3*  HCT 26.3* 24.7*  MCV 87.4 87.3  PLT 159 134*    Basename 03/04/12 0305  LABPROT 14.4  INR 1.14      Assessment/Plan: 71yo s/p GI bleed that appears to have resolved. Follow H/Hs closely. Suspect diverticular source. Continue supportive care. Will follow.   Beth Harper C. 03/06/2012, 3:41 PM

## 2012-03-06 NOTE — Progress Notes (Signed)
I interviewed and examined this patient and discussed the care plan with Dr. Paulina Fusi and the [facility] team and agree with assessment and plan as documented in the progress note for today. She says her last 2 bowel movements looked normal. She understands the unpredictability of whether she will again have a diverticular bleed.     Brasen Bundren A. Sheffield Slider, MD Family Medicine Teaching Service Attending  03/06/2012 9:57 PM

## 2012-03-06 NOTE — Progress Notes (Signed)
Acute blood loss anemia  Assessment: Acute blood loss anemia Lower GI Bleed, likely from ascending colon, stable Anemia HGB decrease from yesterday likely equilibration as the patient reports no blood per rectum  Plan: She does not appear to have active bleeding. We will continue to follow   Subjective: Feels good, c/o neck IV. tol diet,no nausea  Objective: Vital signs in last 24 hours: Temp:  [97.6 F (36.4 C)-98.5 F (36.9 C)] 97.6 F (36.4 C) (10/22 0300) Resp:  [19-20] 20  (10/22 0300) BP: (97-147)/(15-108) 142/83 mmHg (10/22 0300) SpO2:  [99 %] 99 % (10/22 0300) Last BM Date: 03/05/12  Intake/Output from previous day: 10/21 0701 - 10/22 0700 In: 650 [P.O.:600; IV Piggyback:50] Out: 320 [Urine:320] Intake/Output this shift:    General appearance: alert, cooperative and no distress GI: soft, non-tender; bowel sounds normal; no masses,  no organomegaly  Lab Results:  Results for orders placed during the hospital encounter of 02/28/12 (from the past 24 hour(s))  CBC     Status: Abnormal   Collection Time   03/05/12  2:50 PM      Component Value Range   WBC 9.1  4.0 - 10.5 K/uL   RBC 3.15 (*) 3.87 - 5.11 MIL/uL   Hemoglobin 9.4 (*) 12.0 - 15.0 g/dL   HCT 16.1 (*) 09.6 - 04.5 %   MCV 87.6  78.0 - 100.0 fL   MCH 29.8  26.0 - 34.0 pg   MCHC 34.1  30.0 - 36.0 g/dL   RDW 40.9  81.1 - 91.4 %   Platelets 141 (*) 150 - 400 K/uL  CBC     Status: Abnormal   Collection Time   03/05/12  9:00 PM      Component Value Range   WBC 8.2  4.0 - 10.5 K/uL   RBC 3.22 (*) 3.87 - 5.11 MIL/uL   Hemoglobin 9.6 (*) 12.0 - 15.0 g/dL   HCT 78.2 (*) 95.6 - 21.3 %   MCV 87.3  78.0 - 100.0 fL   MCH 29.8  26.0 - 34.0 pg   MCHC 34.2  30.0 - 36.0 g/dL   RDW 08.6  57.8 - 46.9 %   Platelets 144 (*) 150 - 400 K/uL  CBC     Status: Abnormal   Collection Time   03/06/12  2:56 AM      Component Value Range   WBC 6.1  4.0 - 10.5 K/uL   RBC 2.83 (*) 3.87 - 5.11 MIL/uL   Hemoglobin 8.3 (*)  12.0 - 15.0 g/dL   HCT 62.9 (*) 52.8 - 41.3 %   MCV 87.3  78.0 - 100.0 fL   MCH 29.3  26.0 - 34.0 pg   MCHC 33.6  30.0 - 36.0 g/dL   RDW 24.4  01.0 - 27.2 %   Platelets 134 (*) 150 - 400 K/uL  BASIC METABOLIC PANEL     Status: Abnormal   Collection Time   03/06/12  2:56 AM      Component Value Range   Sodium 140  135 - 145 mEq/L   Potassium 4.3  3.5 - 5.1 mEq/L   Chloride 111  96 - 112 mEq/L   CO2 24  19 - 32 mEq/L   Glucose, Bld 99  70 - 99 mg/dL   BUN 16  6 - 23 mg/dL   Creatinine, Ser 5.36 (*) 0.50 - 1.10 mg/dL   Calcium 8.3 (*) 8.4 - 10.5 mg/dL   GFR calc non Af Amer 44 (*) >  90 mL/min   GFR calc Af Amer 51 (*) >90 mL/min  PHOSPHORUS     Status: Normal   Collection Time   03/06/12  2:56 AM      Component Value Range   Phosphorus 2.4  2.3 - 4.6 mg/dL  MAGNESIUM     Status: Normal   Collection Time   03/06/12  2:56 AM      Component Value Range   Magnesium 1.8  1.5 - 2.5 mg/dL     Studies/Results Radiology     MEDS, Scheduled    . famotidine (PEPCID) IV  20 mg Intravenous Q12H  . levothyroxine  50 mcg Intravenous Daily  . potassium chloride  40 mEq Oral Once  . sodium chloride  3 mL Intravenous Q12H       LOS: 7 days    [name], MD, [facility] Surgery, [place] [phone]   03/06/2012 7:52 AM

## 2012-03-06 NOTE — Progress Notes (Signed)
[facility]  Pt didn't need DME

## 2012-03-06 NOTE — Progress Notes (Signed)
Occupational Therapy Treatment Patient Details Name: Beth Harper MRN: [id number] DOB: 05-12-1941 Today's Date: 03/06/2012 Time: 4098-1191 OT Time Calculation (min): 25 min  OT Assessment / Plan / Recommendation Comments on Treatment Session Pt. with improving endurance.  requires supervision for BADLs.  Pt. denies dizziness and BP remained stable    Follow Up Recommendations  No OT follow up;Supervision - Intermittent    Barriers to Discharge       Equipment Recommendations  Tub/shower seat    Recommendations for Other Services    Frequency Min 2X/week   Plan Discharge plan remains appropriate    Precautions / Restrictions Precautions Precautions: Fall Restrictions Weight Bearing Restrictions: No   Pertinent Vitals/Pain     ADL  Lower Body Dressing: Supervision/safety Where Assessed - Lower Body Dressing: Supported standing Toilet Transfer: Supervision/safety Statistician Method: Sit to Barista: Comfort height toilet Transfers/Ambulation Related to ADLs: ambulates in hallway with supervision ADL Comments: Pt. with no complaint of dizziness, and BP stable.      OT Diagnosis:    OT Problem List:   OT Treatment Interventions:     OT Goals ADL Goals ADL Goal: Grooming - Progress: Progressing toward goals ADL Goal: Lower Body Bathing - Progress: Progressing toward goals ADL Goal: Lower Body Dressing - Progress: Progressing toward goals ADL Goal: Toilet Transfer - Progress: Progressing toward goals ADL Goal: Toileting - Clothing Manipulation - Progress: Progressing toward goals  Visit Information  Last OT Received On: 03/06/12 Assistance Needed: +1    Subjective Data      Prior Functioning       Cognition  Overall Cognitive Status: Appears within functional limits for tasks assessed/performed Arousal/Alertness: Awake/alert Orientation Level: Appears intact for tasks assessed Behavior During Session: [facility] for tasks  performed Current Attention Level: Alternating;Divided    Mobility  Shoulder Instructions Bed Mobility Bed Mobility: Supine to Sit;Sitting - Scoot to Edge of Bed Supine to Sit: 7: Independent Sitting - Scoot to [place] of Bed: 7: Independent Transfers Transfers: Sit to Stand;Stand to Sit Sit to Stand: 6: Modified independent (Device/Increase time) Stand to Sit: 6: Modified independent (Device/Increase time)       Exercises      Balance     End of Session OT - End of Session Activity Tolerance: Patient tolerated treatment well Patient left: in chair;with call bell/phone within reach;with nursing in room  GO     Sydnie Sigmund M 03/06/2012, 12:04 PM

## 2012-03-06 NOTE — Progress Notes (Signed)
Physical Therapy Treatment Patient Details Name: Beth Harper MRN: [id number] DOB: 04-28-1941 Today's Date: 03/06/2012 Time: 0865-7846 PT Time Calculation (min): 17 min  PT Assessment / Plan / Recommendation Comments on Treatment Session  Patient s/p Anemia with decr mobility secondary to decr endurance and decr balance.  Recommend 24 hour care at d/c and use of RW at all times.  Needs HHPT as well.      Follow Up Recommendations  Home health PT;Supervision/Assistance - 24 hour     Does the patient have the potential to tolerate intense rehabilitation     Barriers to Discharge        Equipment Recommendations  Rolling walker with 5" wheels;Tub/shower seat    Recommendations for Other Services    Frequency Min 3X/week   Plan Discharge plan remains appropriate;Frequency remains appropriate    Precautions / Restrictions Precautions Precautions: Fall Restrictions Weight Bearing Restrictions: No   Pertinent Vitals/Pain VSS, No pain    Mobility  Bed Mobility Bed Mobility: Not assessed Transfers Transfers: Sit to Stand;Stand to Sit Sit to Stand: 6: Modified independent (Device/Increase time) Stand to Sit: 6: Modified independent (Device/Increase time) Details for Transfer Assistance: Slightly unsteady initially but obtained balance on her own given time. Ambulation/Gait Ambulation/Gait Assistance: 4: Min assist Ambulation Distance (Feet): 300 Feet Assistive device: None Ambulation/Gait Assistance Details: Pt with decreased balance several times during gait requiring steadying assist to maintain balance.   Gait Pattern: Decreased stride length;Step-through pattern;Narrow base of support Gait velocity: Slow gait speed Stairs: No Wheelchair Mobility Wheelchair Mobility: No    Exercises General Exercises - Lower Extremity Long Arc Quad: AROM;Both;10 reps;Seated Hip Flexion/Marching: AROM;Both;10 reps;Seated    PT Goals Acute Rehab PT Goals PT Goal: Sit to Stand -  Progress: Progressing toward goal PT Goal: Stand to Sit - Progress: Progressing toward goal PT Goal: Ambulate - Progress: Progressing toward goal  Visit Information  Last PT Received On: 03/06/12 Assistance Needed: +1    Subjective Data  Subjective: "I just used to be able to get up and go do whatever I wanted."   Cognition  Overall Cognitive Status: Appears within functional limits for tasks assessed/performed Arousal/Alertness: Awake/alert Orientation Level: Appears intact for tasks assessed Behavior During Session: Beth Harper for tasks performed Current Attention Level: Alternating;Divided    Balance  Static Sitting Balance Static Sitting - Level of Assistance: Not tested (comment) Standardized Balance Assessment Standardized Balance Assessment: Berg Balance Test Berg Balance Test Sit to Stand: Able to stand  independently using hands Standing Unsupported: Able to stand safely 2 minutes Sitting with Back Unsupported but Feet Supported on Floor or Stool: Able to sit safely and securely 2 minutes Stand to Sit: Sits safely with minimal use of hands Transfers: Able to transfer safely, minor use of hands Standing Unsupported with Eyes Closed: Able to stand 10 seconds safely Standing Ubsupported with Feet Together: Able to place feet together independently and stand 1 minute safely From Standing, Reach Forward with Outstretched Arm: Can reach confidently >25 cm (10") From Standing Position, Pick up Object from Floor: Able to pick up shoe, needs supervision From Standing Position, Turn to Look Behind Over each Shoulder: Looks behind from both sides and weight shifts well Turn 360 Degrees: Able to turn 360 degrees safely one side only in 4 seconds or less Standing Unsupported, Alternately Place Feet on Step/Stool: Able to stand independently and complete 8 steps >20 seconds Standing Unsupported, One Foot in Front: Able to plae foot ahead of the other independently and hold 30  seconds Standing on One Leg: Able to lift leg independently and hold equal to or more than 3 seconds Total Score: 49  High Level Balance High Level Balance Comments: 49/56 suggesting at moderate risk of falls without a device.  educated patient re: results and encouraged her to get a RW and use at home however pt resistive stating that she has her son there to help her.    End of Session PT - End of Session Equipment Utilized During Treatment: Gait belt Activity Tolerance: Patient tolerated treatment well Patient left: in chair;with call bell/phone within reach Nurse Communication: Mobility status       Beth Harper 03/06/2012, 4:00 PM  [facility] Acute Rehabilitation [phone] (pager)

## 2012-03-06 NOTE — Care Management Note (Signed)
    Page 1 of 2   03/06/2012     1:41:26 PM   CARE MANAGEMENT NOTE 03/06/2012  Patient:  Beth Harper, Beth Harper   Account Number:  [id number]  Date Initiated:  03/01/2012  Documentation initiated by:  Junius Creamer  Subjective/Objective Assessment:   gi bleed     Action/Plan:   lives w fam, pcp dr Gerilyn Nestle funches   Anticipated DC Date:     Anticipated DC Plan:        DC Planning Services  CM consult      PAC Choice  DURABLE MEDICAL EQUIPMENT   Choice offered to / List presented to:     DME arranged  TUB BENCH      DME agency  [facility]        Status of service:   Medicare Important Message given?   (If response is "NO", the following Medicare IM given date fields will be blank) Date Medicare IM given:   Date Additional Medicare IM given:    Discharge Disposition:  HOME/SELF CARE  Per UR Regulation:  Reviewed for med. necessity/level of care/duration of stay  If discussed at Long Length of Stay Meetings, dates discussed:   03/06/2012    Comments:  10/22 13:40p debbie Albie Bazin rn,bsn spoke w darian w [facility] to be sure they had gotten ref for tub bench.  10/17 8:48a debbie Bemnet Trovato rn,bsn [phone]

## 2012-03-07 ENCOUNTER — Encounter (HOSPITAL_COMMUNITY): Payer: Self-pay | Admitting: Family Medicine

## 2012-03-07 LAB — CBC
HCT: 25.7 % — ABNORMAL LOW (ref 36.0–46.0)
Hemoglobin: 8.6 g/dL — ABNORMAL LOW (ref 12.0–15.0)
MCH: 29.9 pg (ref 26.0–34.0)
MCHC: 33.5 g/dL (ref 30.0–36.0)
MCV: 89.2 fL (ref 78.0–100.0)
RBC: 2.88 MIL/uL — ABNORMAL LOW (ref 3.87–5.11)

## 2012-03-07 LAB — TYPE AND SCREEN
ABO/RH(D): AB POS
Antibody Screen: NEGATIVE
Unit division: 0

## 2012-03-07 MED ORDER — FAMOTIDINE 20 MG PO TABS: 20.0000 mg | ORAL_TABLET | Freq: Two times a day (BID) | ORAL | Status: DC

## 2012-03-07 MED ORDER — LEVOTHYROXINE SODIUM 75 MCG PO TABS
75.0000 ug | ORAL_TABLET | Freq: Every day | ORAL | Status: DC
  Filled 2012-03-07: qty 1

## 2012-03-07 MED ORDER — FAMOTIDINE 20 MG PO TABS
20.0000 mg | ORAL_TABLET | Freq: Two times a day (BID) | ORAL | Status: DC
  Filled 2012-03-07 (×2): qty 1

## 2012-03-07 NOTE — Progress Notes (Signed)
PGY-1 Daily Progress Note Family Medicine Teaching Service [place] R. Hess, DO Service Pager: [phone]   Subjective: Pt seen at bedside overnight. Son present in room. Pt states overall she feels "pretty good" and is eager to go home. She states she has had had no further blood in her stool, though she had not had a BM as of this morning when I saw her; she had at least two BM's yesterday with no blood. Otherwise, she has no complaints; specifically denies chest pain or abdominal pain, swelling/pain in calves, SOB, N/V.  Objective:  VITALS Temp:  [97.7 F (36.5 C)-98.5 F (36.9 C)] 97.7 F (36.5 C) (10/23 1145) Pulse Rate:  [80-96] 80  (10/23 1145) Resp:  [12-20] 17  (10/23 1145) BP: (102-185)/(73-96) 102/89 mmHg (10/23 1145) SpO2:  [96 %-100 %] 97 % (10/23 1145) Weight:  [118 lb 2.7 oz (53.6 kg)] 118 lb 2.7 oz (53.6 kg) (10/23 0559)  In/Out  Intake/Output Summary (Last 24 hours) at 03/07/12 1415 Last data filed at 03/06/12 2232  Gross per 24 hour  Intake    290 ml  Output      0 ml  Net    290 ml    Physical Exam: Filed Vitals:   03/07/12 1145  BP: 102/89  Pulse: 80  Temp: 97.7 F (36.5 C)  Resp: 17    General appearance: elderly female in NAD, very pleasant Lungs: CTAB, no wheezes, normal work of breathing Heart: RRR, S1, S2 normal, no murmur appreciated Abdomen: soft, non-tender to palpation, not distended on inspection; BS+ Extremities: warm, well-perfused, no calf swelling or tenderness, distal pulses intact/symmetric  MEDS Scheduled Meds:    . famotidine  20 mg Oral BID  . levothyroxine  75 mcg Oral QAC breakfast  . sodium chloride  3 mL Intravenous Q12H  . DISCONTD: famotidine (PEPCID) IV  20 mg Intravenous Q12H  . DISCONTD: levothyroxine  50 mcg Intravenous Daily   Continuous Infusions:    PRN Meds:.acetaminophen, acetaminophen, ondansetron (ZOFRAN) IV, ondansetron, DISCONTD: fentaNYL  Labs and imaging:   CBC  Lab 03/07/12 0430 03/06/12 2000  03/06/12 1015  WBC 5.2 5.9 6.3  HGB 8.6* 8.3* 8.7*  HCT 25.7* 25.3* 26.3*  PLT 188 176 159   BMET/CMET  Lab 03/06/12 0256 03/05/12 0500 03/04/12 0305 03/03/12 1315  NA 140 137 140 --  K 4.3 3.6 3.6 --  CL 111 110 112 --  CO2 24 23 22  --  BUN 16 14 22  --  CREATININE 1.19* 1.09 1.25* --  CALCIUM 8.3* 8.1* 8.0* --  PROT -- -- -- 4.1*  BILITOT -- -- -- 0.4  ALKPHOS -- -- -- 28*  ALT -- -- -- 10  AST -- -- -- 20  GLUCOSE 99 100* 80 --   Results for orders placed during the hospital encounter of 02/28/12 (from the past 24 hour(s))  CBC     Status: Abnormal   Collection Time   03/06/12  8:00 PM      Component Value Range   WBC 5.9  4.0 - 10.5 K/uL   RBC 2.89 (*) 3.87 - 5.11 MIL/uL   Hemoglobin 8.3 (*) 12.0 - 15.0 g/dL   HCT 84.1 (*) 66.0 - 63.0 %   MCV 87.5  78.0 - 100.0 fL   MCH 28.7  26.0 - 34.0 pg   MCHC 32.8  30.0 - 36.0 g/dL   RDW 16.0  10.9 - 32.3 %   Platelets 176  150 - 400 K/uL  CBC  Status: Abnormal   Collection Time   03/07/12  4:30 AM      Component Value Range   WBC 5.2  4.0 - 10.5 K/uL   RBC 2.88 (*) 3.87 - 5.11 MIL/uL   Hemoglobin 8.6 (*) 12.0 - 15.0 g/dL   HCT 57.8 (*) 46.9 - 62.9 %   MCV 89.2  78.0 - 100.0 fL   MCH 29.9  26.0 - 34.0 pg   MCHC 33.5  30.0 - 36.0 g/dL   RDW 52.8  41.3 - 24.4 %   Platelets 188  150 - 400 K/uL       Assessment  71 year old female with PMHx of Diverticulitis s/p hemicolectomy who presents with bright red blood per rectum:   1) GI bleeding- Pt has not had episodes of bloody stool over the last 24-48 hrs. Tolerating PO well.  1) Transferred back to SDU yesterday from CCM. Hb stable over the last day: 8.3 > 8.7 > 8.3 > 8.6.    2) GI and surgery notes from today agree pt is stable for possible discharge home, today.  3) Continue holding ASA.   4) Zofran for nausea and Pepcid for GI PPx.  2) Hypotension-Resolved. Some BP elevation this morning/overnight, but improved.   1) Resolved s/p multiple transufsions and fluid  boluses.   2) Continue to hold BP meds and follow; likely restart at discharge.    3) Hypothyroidism- continued home synthroid.   4) FEN- SLIV 5) DVT PPX- SCD's (no anticoagulation due to bleeding)  6) Disposition- Management as above. Likely DC home, today. If any further bloody stools or other symptoms, would recheck stat CBC and would have a low threshold to re-consult IR for embolization of likely diverticular bleed and/or transfuse.  Code Status: Full   Bobbye Morton, MD PGY-1, [facility] Family Medicine FPTS Intern pager: [phone]

## 2012-03-07 NOTE — Progress Notes (Signed)
Patient ID: Beth Harper, female   DOB: November 13, 1940, 71 y.o.   MRN: [id number] 6 Days Post-Op  Subjective: Patient appears comfortable, her son is at bedside, patient states that she is tolerating diet, but wants a "steak and potato! Ready to go home!"  Objective: Vital signs in last 24 hours: Temp:  [97.2 F (36.2 C)-98.5 F (36.9 C)] 97.9 F (36.6 C) (10/23 0745) Pulse Rate:  [80-96] 80  (10/23 0745) Resp:  [12-20] 18  (10/23 0745) BP: (129-185)/(73-96) 157/87 mmHg (10/23 0745) SpO2:  [96 %-100 %] 96 % (10/23 0745) Weight:  [118 lb 2.7 oz (53.6 kg)] 118 lb 2.7 oz (53.6 kg) (10/23 0559) Last BM Date: 03/06/12  Intake/Output from previous day: 10/22 0701 - 10/23 0700 In: 580 [P.O.:480; IV Piggyback:100] Out: -  Intake/Output this shift:    General appearance: alert, cooperative, appears stated age and no distress Chest: CTA bilaterally Cardiac: RRR Abdomen: soft, non tender, + BS No further "bloody BM's " no bloating. WBC wnl, H&H remains stable, VSS, afebrile.  Lab Results:   Basename 03/07/12 0430 03/06/12 2000  WBC 5.2 5.9  HGB 8.6* 8.3*  HCT 25.7* 25.3*  PLT 188 176   BMET  Basename 03/06/12 0256 03/05/12 0500  NA 140 137  K 4.3 3.6  CL 111 110  CO2 24 23  GLUCOSE 99 100*  BUN 16 14  CREATININE 1.19* 1.09  CALCIUM 8.3* 8.1*   PT/INR No results found for this basename: LABPROT:2,INR:2 in the last 72 hours ABG No results found for this basename: PHART:2,PCO2:2,PO2:2,HCO3:2 in the last 72 hours  Studies/Results: No results found.  Anti-infectives: Anti-infectives     Start     Dose/Rate Route Frequency Ordered Stop   03/01/12 2145   ceFAZolin (ANCEF) IVPB 1 g/50 mL premix        1 g 100 mL/hr over 30 Minutes Intravenous  Once 03/01/12 2142 03/01/12 2212          Assessment/Plan: Patient Active Problem List  Diagnosis  . HYPOTHYROIDISM, UNSPECIFIED  . HYPERLIPIDEMIA  . HYPERTENSION, BENIGN SYSTEMIC  . GERD  . ROTATOR CUFF SYNDROME  .  Weight loss  . Hyperlipidemia  . GI bleed  . Acute blood loss anemia  . Hypotension due to blood loss   s/p Procedure(s) (LRB) with comments: COLONOSCOPY (N/A) - Pat 1. ? Possible discharge to home/self care vs floor. 2 If patient remains in hospital continue to monitor H&H; plan for angio embolization if bleed re-occurs per GI.    LOS: 8 days    [name] [facility] Pager # [phone]  03/07/2012

## 2012-03-07 NOTE — Progress Notes (Signed)
As per note of JD,PA. No surgical indication at this time. We will see again PRN.

## 2012-03-07 NOTE — Discharge Summary (Signed)
I interviewed and examined this patient and discussed the care plan with Dr. Paulina Fusi and the [facility] team and agree with assessment and plan as documented in the discharge note for today.    Rejoice Heatwole A. Sheffield Slider, MD Family Medicine Teaching Service Attending  03/07/2012 9:46 PM

## 2012-03-07 NOTE — Progress Notes (Signed)
Patient ID: Beth Harper, female   DOB: Jul 22, 1940, 71 y.o.   MRN: [id number]  Feels well. Sitting up in bed. Son at bedside. No bleeding overnight. Wants to go home. Tolerating solid food. Hgb stable at 8.6.   S/P GI bleed that has resolved and was likely diverticular.  OK to go home from GI standpoint. Will sign off. Call if questions. F/U with GI prn.

## 2012-03-08 ENCOUNTER — Inpatient Hospital Stay (HOSPITAL_COMMUNITY)
Admission: EM | Admit: 2012-03-08 | Discharge: 2012-03-14 | DRG: 378 | Disposition: A | Discharge status: Home / Self Care | Payer: Medicare Other | Attending: Family Medicine | Admitting: Family Medicine

## 2012-03-08 ENCOUNTER — Telehealth: Payer: Self-pay | Admitting: Family Medicine

## 2012-03-08 ENCOUNTER — Encounter (HOSPITAL_COMMUNITY): Payer: Self-pay | Admitting: Emergency Medicine

## 2012-03-08 DIAGNOSIS — E46 Unspecified protein-calorie malnutrition: Secondary | ICD-10-CM | POA: Diagnosis present

## 2012-03-08 DIAGNOSIS — Z79899 Other long term (current) drug therapy: Secondary | ICD-10-CM

## 2012-03-08 DIAGNOSIS — D62 Acute posthemorrhagic anemia: Secondary | ICD-10-CM | POA: Diagnosis present

## 2012-03-08 DIAGNOSIS — R1013 Epigastric pain: Secondary | ICD-10-CM

## 2012-03-08 DIAGNOSIS — F329 Major depressive disorder, single episode, unspecified: Secondary | ICD-10-CM | POA: Diagnosis present

## 2012-03-08 DIAGNOSIS — K625 Hemorrhage of anus and rectum: Secondary | ICD-10-CM

## 2012-03-08 DIAGNOSIS — E785 Hyperlipidemia, unspecified: Secondary | ICD-10-CM | POA: Diagnosis present

## 2012-03-08 DIAGNOSIS — I10 Essential (primary) hypertension: Secondary | ICD-10-CM

## 2012-03-08 DIAGNOSIS — K922 Gastrointestinal hemorrhage, unspecified: Principal | ICD-10-CM | POA: Diagnosis present

## 2012-03-08 DIAGNOSIS — Z8719 Personal history of other diseases of the digestive system: Secondary | ICD-10-CM | POA: Diagnosis present

## 2012-03-08 DIAGNOSIS — F3289 Other specified depressive episodes: Secondary | ICD-10-CM | POA: Diagnosis present

## 2012-03-08 DIAGNOSIS — K5730 Diverticulosis of large intestine without perforation or abscess without bleeding: Secondary | ICD-10-CM | POA: Diagnosis present

## 2012-03-08 DIAGNOSIS — Z66 Do not resuscitate: Secondary | ICD-10-CM | POA: Diagnosis present

## 2012-03-08 DIAGNOSIS — K219 Gastro-esophageal reflux disease without esophagitis: Secondary | ICD-10-CM | POA: Diagnosis present

## 2012-03-08 DIAGNOSIS — E039 Hypothyroidism, unspecified: Secondary | ICD-10-CM | POA: Diagnosis present

## 2012-03-08 DIAGNOSIS — R634 Abnormal weight loss: Secondary | ICD-10-CM

## 2012-03-08 DIAGNOSIS — R531 Weakness: Secondary | ICD-10-CM

## 2012-03-08 LAB — HEMOGLOBIN AND HEMATOCRIT, BLOOD
HCT: 24.2 % — ABNORMAL LOW (ref 36.0–46.0)
HCT: 23.9 % — ABNORMAL LOW (ref 36.0–46.0)
HCT: 26.6 % — ABNORMAL LOW (ref 36.0–46.0)
Hemoglobin: 8 g/dL — ABNORMAL LOW (ref 12.0–15.0)
Hemoglobin: 7.8 g/dL — ABNORMAL LOW (ref 12.0–15.0)

## 2012-03-08 LAB — URINALYSIS, ROUTINE W REFLEX MICROSCOPIC
Bilirubin Urine: NEGATIVE
Glucose, UA: NEGATIVE mg/dL
Ketones, ur: NEGATIVE mg/dL
Leukocytes, UA: NEGATIVE
Protein, ur: NEGATIVE mg/dL
pH: 7 (ref 5.0–8.0)

## 2012-03-08 LAB — COMPREHENSIVE METABOLIC PANEL
ALT: 26 U/L (ref 0–35)
Albumin: 2.7 g/dL — ABNORMAL LOW (ref 3.5–5.2)
Alkaline Phosphatase: 102 U/L (ref 39–117)
BUN: 19 mg/dL (ref 6–23)
Chloride: 106 mEq/L (ref 96–112)
GFR calc Af Amer: 50 mL/min — ABNORMAL LOW (ref >90)
Glucose, Bld: 92 mg/dL (ref 70–99)
Potassium: 4 mEq/L (ref 3.5–5.1)
Sodium: 142 mEq/L (ref 135–145)
Total Bilirubin: 0.3 mg/dL (ref 0.3–1.2)

## 2012-03-08 LAB — CBC WITH DIFFERENTIAL/PLATELET
Hemoglobin: 8.9 g/dL — ABNORMAL LOW (ref 12.0–15.0)
Lymphs Abs: 1.8 10*3/uL (ref 0.7–4.0)
Monocytes Relative: 9 % (ref 3–12)
Neutro Abs: 2.9 10*3/uL (ref 1.7–7.7)
Neutrophils Relative %: 54 % (ref 43–77)
RBC: 2.94 MIL/uL — ABNORMAL LOW (ref 3.87–5.11)
WBC: 5.4 10*3/uL (ref 4.0–10.5)

## 2012-03-08 MED ORDER — SIMVASTATIN 40 MG PO TABS
40.0000 mg | ORAL_TABLET | Freq: Every day | ORAL | Status: DC
  Administered 2012-03-08 – 2012-03-14 (×7): 40 mg via ORAL
  Filled 2012-03-08 (×7): qty 1

## 2012-03-08 MED ORDER — PANTOPRAZOLE SODIUM 40 MG IV SOLR
40.0000 mg | Freq: Once | INTRAVENOUS | Status: AC
  Administered 2012-03-08: 40 mg via INTRAVENOUS
  Filled 2012-03-08: qty 40

## 2012-03-08 MED ORDER — LEVOTHYROXINE SODIUM 75 MCG PO TABS
75.0000 ug | ORAL_TABLET | Freq: Every day | ORAL | Status: DC
  Administered 2012-03-08 – 2012-03-14 (×7): 75 ug via ORAL
  Filled 2012-03-08 (×8): qty 1

## 2012-03-08 MED ORDER — POTASSIUM CHLORIDE IN NACL 20-0.9 MEQ/L-% IV SOLN
INTRAVENOUS | Status: DC
  Administered 2012-03-08 – 2012-03-12 (×7): via INTRAVENOUS
  Filled 2012-03-08 (×10): qty 1000

## 2012-03-08 MED ORDER — LISINOPRIL 40 MG PO TABS
40.0000 mg | ORAL_TABLET | Freq: Every day | ORAL | Status: DC
  Administered 2012-03-08 – 2012-03-14 (×6): 40 mg via ORAL
  Filled 2012-03-08 (×7): qty 1

## 2012-03-08 MED ORDER — PANTOPRAZOLE SODIUM 40 MG PO TBEC
40.0000 mg | DELAYED_RELEASE_TABLET | Freq: Every day | ORAL | Status: DC
  Administered 2012-03-08 – 2012-03-14 (×7): 40 mg via ORAL
  Filled 2012-03-08 (×6): qty 1

## 2012-03-08 MED ORDER — HYDROCHLOROTHIAZIDE 25 MG PO TABS
25.0000 mg | ORAL_TABLET | Freq: Every day | ORAL | Status: DC
  Administered 2012-03-08 – 2012-03-14 (×6): 25 mg via ORAL
  Filled 2012-03-08 (×7): qty 1

## 2012-03-08 MED ORDER — ACETAMINOPHEN 500 MG PO TABS
500.0000 mg | ORAL_TABLET | Freq: Four times a day (QID) | ORAL | Status: DC | PRN
  Administered 2012-03-12 – 2012-03-13 (×3): 500 mg via ORAL
  Filled 2012-03-08 (×3): qty 1

## 2012-03-08 MED ORDER — FAMOTIDINE 20 MG PO TABS
20.0000 mg | ORAL_TABLET | Freq: Two times a day (BID) | ORAL | Status: DC
  Administered 2012-03-08 – 2012-03-14 (×13): 20 mg via ORAL
  Filled 2012-03-08 (×14): qty 1

## 2012-03-08 MED ORDER — CALCIUM CARBONATE-VITAMIN D 500-200 MG-UNIT PO TABS
1.0000 | ORAL_TABLET | Freq: Two times a day (BID) | ORAL | Status: DC
  Administered 2012-03-08 – 2012-03-14 (×14): 1 via ORAL
  Filled 2012-03-08 (×17): qty 1

## 2012-03-08 NOTE — H&P (Signed)
Family Medicine Teaching [facility] Admission History and Physical Service Pager: [phone]  Patient name: Beth Harper Medical record number: [id number] Date of birth: Jan 11, 1941 Age: 71 y.o. Gender: female  Primary Care Provider: Dessa Phi, MD  Chief Complaint: Stool with clots  Assessment and Plan: Beth Harper is 71 year old female with PMHx of Diverticulitis s/p hemicolectomy who presents to the ED with two episodes of stool with clots concerning for LGIB.  1) GI bleeding- Pt recently d/c with a LGIB and had two episodes since returning home.  1) Vitals in ED stable.  CBC was 8.7 and repeat 8.1 after giving her 500 cc bolus.  2) Hemocult + in ED.    3) Monitor vitals per unit.    4) Continue holding ASA, Zofran for nausea, Pepcid for GI PPx as well as Protonix 40 mg qd.   5) Repeat CBC in the next 12 hrs to monitor Hgb and BMP for electrolytes.   6) Will consider consulting IR for re-embolization or GI for possible total colectomy.    7) If has an episode of BRBPR, would consider transfusion as pt did have type and screen in the ED.  2)Hypertension - Will continue home meds.  Low threshold of stopping these if she starts to show S/Sx of hypotension from blood loss.    1) Vitals q unit  2) Fluids @ 75cc/hr and continue to monitor.  3) Hypothyroidism- continue home synthroid.  4) FEN- NS @ 75cc/hr for gentle hydration. NPO and Protonix 40 mg qd 5) DVT PPX- SCD's (no anticoagulation due to bleeding)  6) Disposition- pending clinical improvement, resolution of bleeding.  7) Full Code  History of Present Illness: Beth Harper is a 71 y.o. year old female presenting with two episodes of stool with clots.  Pt left the hospital yesterday after not passing blood in her stool for two days, and returned home.  Her and her son stopped at [employer] to pick up her prescriptions before going home and pt had a cup of coffee to take her pills with.  Shortly after, pt went to the  bathroom and described having a bowel movement with two large clots.  At this point, pt returned to the couch and again had the sensation of having to have a bowel movement.  Pt returned to the bathroom and had another bowel movement with the passing of clot but denies BRBPR.  She remained on the toilet because she felt weak, dizzy, and lightheaded.  When she tried to stand up she had continued lightheadedness and dizziness before going back to the couch.  Pt continued to have tenesmus but did not have further episodes of passing clot or stool. At this point, her son called the on call doctor who recommended she come in for evaluation in the ED.   In the ED, pt had a hemmocult which was positive and a repeat CBC showing a Hgb of 8.9 (Previous 4 Hgb before discharge were 8.3, 8.7, 8.3, and 8.6).  She was given a NS bolus, her vitals remained stable, and a repeat Hgb was 8.1.  Pt also did have type and screen performed in the ED.   Patient Active Problem List  Diagnosis  . HYPOTHYROIDISM, UNSPECIFIED  . HYPERLIPIDEMIA  . HYPERTENSION, BENIGN SYSTEMIC  . GERD  . ROTATOR CUFF SYNDROME  . Weight loss  . Hyperlipidemia  . GI bleed  . Acute blood loss anemia  . Hypotension due to blood loss   Past Medical History: Past Medical  History  Diagnosis Date  . Hypertension   . Diverticula, small intestine   . Hyperlipidemia   . Hypothyroidism    Past Surgical History: Past Surgical History  Procedure Date  . Colon surgery 09/22/2000    left hemicolectomy -Dr Magnus Ivan  . Appendectomy 09/22/2000  . Colonoscopy 03/01/2012    Procedure: COLONOSCOPY;  Surgeon: Graylin Shiver, MD;  Location: [facility] ENDOSCOPY;  Service: Endoscopy;  Laterality: N/A;  Pat  . Colostomy takedown 2002    Dr Magnus Ivan   Social History: History  Substance Use Topics  . Smoking status: Former Smoker    Quit date: 05/16/2010  . Smokeless tobacco: Never Used  . Alcohol Use: Yes     occassional   For any additional social  history documentation, please refer to relevant sections of EMR.  Family History: No family history on file. Allergies: No Known Allergies Current Facility-Administered Medications on File Prior to Encounter  Medication Dose Route Frequency Provider Last Rate Last Dose  . DISCONTD: acetaminophen (TYLENOL) suppository 650 mg  650 mg Rectal Q6H PRN Ardyth Gal, MD      . DISCONTD: acetaminophen (TYLENOL) tablet 650 mg  650 mg Oral Q6H PRN Ardyth Gal, MD   650 mg at 03/03/12 2126  . DISCONTD: famotidine (PEPCID) IVPB 20 mg  20 mg Intravenous Q12H Storm Frisk, MD   20 mg at 03/06/12 2232  . DISCONTD: famotidine (PEPCID) tablet 20 mg  20 mg Oral BID Stephanie [address]oup [address], MD      . DISCONTD: fentaNYL (SUBLIMAZE) injection 12.5-25 mcg  12.5-25 mcg Intravenous Q2H PRN Merwyn Katos, MD      . DISCONTD: levothyroxine (SYNTHROID, LEVOTHROID) injection 50 mcg  50 mcg Intravenous Daily Storm Frisk, MD   50 mcg at 03/06/12 6962  . DISCONTD: levothyroxine (SYNTHROID, LEVOTHROID) tablet 75 mcg  75 mcg Oral QAC breakfast Stephanie [address], MD      . DISCONTD: ondansetron [facility]) injection 4 mg  4 mg Intravenous Q6H PRN Ardyth Gal, MD   4 mg at 03/01/12 0109  . DISCONTD: ondansetron (ZOFRAN) tablet 4 mg  4 mg Oral Q6H PRN Ardyth Gal, MD      . DISCONTD: sodium chloride 0.9 % injection 3 mL  3 mL Intravenous Q12H Ardyth Gal, MD   3 mL at 03/06/12 2237   Current Outpatient Prescriptions on File Prior to Encounter  Medication Sig Dispense Refill  . acetaminophen (TYLENOL) 500 MG tablet Take 500 mg by mouth every 6 (six) hours as needed. For pain      . Calcium Carbonate-Vitamin D (CALCIUM-VITAMIN D) 500-200 MG-UNIT per tablet Take 1 tablet by mouth 2 (two) times daily with a meal.  60 tablet  11  . hydrochlorothiazide (HYDRODIURIL) 25 MG tablet Take 1 tablet (25 mg total) by mouth daily.  90 tablet  3  . levothyroxine (SYNTHROID, LEVOTHROID) 75 MCG tablet  Take 1 tablet (75 mcg total) by mouth daily.  90 tablet  1  . lisinopril (PRINIVIL,ZESTRIL) 40 MG tablet Take 1 tablet (40 mg total) by mouth daily.  90 tablet  3  . pravastatin (PRAVACHOL) 80 MG tablet Take 1 tablet (80 mg total) by mouth daily.  90 tablet  1  . ranitidine (ZANTAC) 150 MG tablet Take 1 tablet (150 mg total) by mouth at bedtime.  90 tablet  3   Review Of Systems: Per HPI   Physical Exam: BP 163/82  Pulse 74  Temp 98.2 F (36.8 C) (Oral)  Resp 18  SpO2 100% Exam: General: NAD, WN/WD, pleasant female HEENT: [place]/AT, PERRLA, EOMI B/L, mucous membranes slightly dry Cardiovascular: RRR, + 1/6 SEM LUSB Respiratory: CTA B/L Abdomen: NABS, Nontender, nondistended, soft, + incision midline from previous hemicolectomy Extremities: Pulses + 2/4, slight pallor  Skin: No rashes Neuro: AAO x 3, MS 5/5 B/L UE and LE   Labs and Imaging: CBC BMET   Lab 03/08/12 0354 03/08/12 0121  WBC -- 5.4  HGB 8.1* --  HCT 24.2* --  PLT -- 234     Lab 03/08/12 0121  NA 142  K 4.0  CL 106  CO2 27  BUN 19  CREATININE 1.22*  GLUCOSE 92  CALCIUM 9.3      Gildardo Cranker, DO 03/08/2012, 4:28 AM  FMTS Attending Admit Note Patient seen and examined by me, reviewed resident assess/plan and agree with their management, with additions.  She is well known to me from prior admission.  She was discharged yesterday but then had three BMs that had considerable number of clots (not bright red blood) and felt out of sorts, so she came back to ED.  Son [name] is at bedside and is her primary caregiver.  At present she denies abd pain, nausea/vomiting, no fevers/chills, no chest pain/dyspnea.   She is awake, alert and oriented.  MMM Neck supple.  Lungs clear Heart regular S1S2 ABD soft, flat, nontender. Audible bowel sounds and no palpable masses.   Assess/Plan: Recently discharged for GIB; her drop in Hgb from 0121 to 0354am (8.9 to 8.1) may be dilutional; would consider another Hgb check  sooner than 12 hours to confirm absence of active bleeding.  Would involve her GI, the IR team initially; consider reconsulting gen surgery after consults of these two, who know her well from recent admission.   [name], MD

## 2012-03-08 NOTE — Progress Notes (Signed)
PGY 1 Update:  Pt is resting comfortably when going to examine.  She is not c/o acute abdominal pain or has not passed blood with stool.  Exam:  Gen : Easily arousalable, NAD Cardio: RRR, +1/6 SEM Lungs: CTAB Abdomen: Soft, NTND, NABS, No HSM Extremities: Pulses +2/4  A/P 1) GI bleeding- Pt recently d/c with a LGIB and had two episodes since returning home.   1) Repeat H & H this AM.   2) Monitor vitals per unit.   3) Continue holding ASA, Zofran for nausea, Pepcid for GI PPx as well as Protonix 40 mg qd.   4) Will consider consulting IR for re-embolization or GI for possible total colectomy.   5) If has an episode of BRBPR, would consider transfusion as pt did have type and screen in the ED.  2)Hypertension - Will continue home meds. Low threshold of stopping these if she starts to show S/Sx of hypotension from blood loss.   1) Vitals q unit   2) Fluids @ 75cc/hr and continue to monitor.   Twana First Paulina Fusi, DO of [facility] 03/08/2012, 9:42 AM

## 2012-03-08 NOTE — ED Provider Notes (Signed)
Medical screening examination/treatment/procedure(s) were conducted as a shared visit with non-physician practitioner(s) and myself.  I personally evaluated the patient during the encounter  Rectal bleeding with clots, discharged 6 hours ago after 8 day stay.  Abdomen soft and nontender. Vitals stable. Unsuccessful embolization during last admission.  Glynn Octave, MD 03/08/12 [phone]

## 2012-03-08 NOTE — ED Notes (Signed)
Report called to [id number] RN

## 2012-03-08 NOTE — ED Provider Notes (Signed)
History     CSN: [id number]  Arrival date & time 03/08/12  0052   First MD Initiated Contact with Patient 03/08/12 0100      Chief Complaint  Patient presents with  . Rectal Bleeding    (Consider location/radiation/quality/duration/timing/severity/associated sxs/prior treatment) HPI History provided by pt, her son and prior chart.  Per prior chart, pt admitted 10/15-10/23/13 for rectal bleeding w/ anemia requiring transfusion.  Colonoscopy performed by GI and bleeding appeared to be originating at ascending colon.  Embolization attempted unsuccessfully but bleeding resolved spontaneously.  At time of discharge, pt had been w/out hematochezia x 2-3 days, tolerating a normal diet and hgb stable.  Her son reports that she had two BMs this evening, both of which had multiple bright red blood clots in them, and then on a third attempt at a BM, there was blood on tp when she wiped.  Pt reports diffuse lower abd cramping and nausea as well as generalized weakness.  Denies fever, rectal pain, lightheadedness and SOB.  She is not anti-coagulated.    Past Medical History  Diagnosis Date  . Hypertension   . Diverticula, small intestine   . Hyperlipidemia   . Hypothyroidism     Past Surgical History  Procedure Date  . Colon surgery 09/22/2000    left hemicolectomy -Dr Magnus Ivan  . Appendectomy 09/22/2000  . Colonoscopy 03/01/2012    Procedure: COLONOSCOPY;  Surgeon: Graylin Shiver, MD;  Location: [facility] ENDOSCOPY;  Service: Endoscopy;  Laterality: N/A;  Pat  . Colostomy takedown 2002    Dr Magnus Ivan    No family history on file.  History  Substance Use Topics  . Smoking status: Former Smoker    Quit date: 05/16/2010  . Smokeless tobacco: Never Used  . Alcohol Use: Yes     occassional    OB History    Grav Para Term Preterm Abortions TAB SAB Ect Mult Living                  Review of Systems  All other systems reviewed and are negative.    Allergies  Review of patient's  allergies indicates no known allergies.  Home Medications   Current Outpatient Rx  Name Route Sig Dispense Refill  . ACETAMINOPHEN 500 MG PO TABS Oral Take 500 mg by mouth every 6 (six) hours as needed. For pain    . CALCIUM-VITAMIN D 500-200 MG-UNIT PO TABS Oral Take 1 tablet by mouth 2 (two) times daily with a meal. 60 tablet 11  . FAMOTIDINE 20 MG PO TABS Oral Take 1 tablet (20 mg total) by mouth 2 (two) times daily. 60 tablet 3  . HYDROCHLOROTHIAZIDE 25 MG PO TABS Oral Take 1 tablet (25 mg total) by mouth daily. 90 tablet 3  . LEVOTHYROXINE SODIUM 75 MCG PO TABS Oral Take 1 tablet (75 mcg total) by mouth daily. 90 tablet 1  . LISINOPRIL 40 MG PO TABS Oral Take 1 tablet (40 mg total) by mouth daily. 90 tablet 3  . PRAVASTATIN SODIUM 80 MG PO TABS Oral Take 1 tablet (80 mg total) by mouth daily. 90 tablet 1  . RANITIDINE HCL 150 MG PO TABS Oral Take 1 tablet (150 mg total) by mouth at bedtime. 90 tablet 3    BP 149/67  Pulse 94  Temp 98.2 F (36.8 C) (Oral)  Resp 16  SpO2 100%  Physical Exam  Nursing note and vitals reviewed. Constitutional: She is oriented to person, place, and time. She appears well-developed  and well-nourished. No distress.  HENT:  Head: Normocephalic and atraumatic.  Eyes:       Normal appearance  Neck: Normal range of motion.  Cardiovascular: Normal rate and regular rhythm.   Pulmonary/Chest: Effort normal and breath sounds normal. No respiratory distress.  Abdominal: Soft. Bowel sounds are normal. She exhibits no distension and no mass. There is no rebound and no guarding.       LLQ and RUQ ttp  Genitourinary:       No gross red blood in rectum.   Musculoskeletal: Normal range of motion.  Neurological: She is alert and oriented to person, place, and time.  Skin: Skin is warm and dry. No rash noted.  Psychiatric: She has a normal mood and affect. Her behavior is normal.    ED Course  Procedures (including critical care time)  Labs Reviewed  CBC  WITH DIFFERENTIAL - Abnormal; Notable for the following:    RBC 2.94 (*)     Hemoglobin 8.9 (*)     HCT 26.3 (*)     All other components within normal limits  COMPREHENSIVE METABOLIC PANEL - Abnormal; Notable for the following:    Creatinine, Ser 1.22 (*)     Total Protein 5.9 (*)     Albumin 2.7 (*)     GFR calc non Af Amer 43 (*)     GFR calc Af Amer 50 (*)     All other components within normal limits  HEMOGLOBIN AND HEMATOCRIT, BLOOD - Abnormal; Notable for the following:    Hemoglobin 8.1 (*)     HCT 24.2 (*)     All other components within normal limits  TYPE AND SCREEN  PROTIME-INR  URINALYSIS, ROUTINE W REFLEX MICROSCOPIC  OCCULT BLOOD, POC DEVICE  OCCULT BLOOD X 1 CARD TO LAB, STOOL   No results found.   1. Essential hypertension, benign   2. Rectal bleeding       MDM  71yo F discharged from hospital today after 8 day stay for eval/tmnt rectal bleeding and anemia.  Bleeding found to originate in ascending colon but resolved spontaneously and at time of discharge, pt had been w/out hematochezia x 2-3 days, hgb stable following transfusion and tolerating nml diet.  Had two episodes of hematochezia w/ associated lower abd cramping, nausea and generalized weakness this evening. On exam, afebrile, normotensive and nml HR, LLQ and RUQ ttp, no gross red blood in rectum.  Pt receiving IV fluids.  Labs, including hemoccult pending. Will monitor closely.  1:50 AM   VSS. Abd pain has improved and pt has not had a BM here in ED.  Hemoccult positive, hgb improved from yesterday afternoon and labs otherwise unremarkable.  All results discussed w/ pt and her son.  They are concerned about going home and symptoms worsening.  Consulted [facility] who will see pt in ED.  3:57 AM     Pt admitted by [facility].          Otilio Miu, [place] 03/08/12 [phone]

## 2012-03-08 NOTE — ED Notes (Signed)
PT. REPORTS RECTAL BLEEDING ONSET LAST NIGHT , DISCHARGE FROM HOSPITAL YESTERDAY - DIAGNOSED WITH DIVERTICULITIS, SLIGHT GENERALIZED ABDOMINAL CRAMPING.

## 2012-03-08 NOTE — Discharge Summary (Signed)
Physician Discharge Summary  Patient ID: Beth Harper MRN: [id number] DOB: 1941/02/14 Age: 71 y.o.  Admit date: 03/08/2012 Discharge date: 03/12/2012 Admitting Physician: Barbaraann Barthel, MD  PCP: Dessa Phi, MD  Consultants:GI     Discharge Diagnosis: Principal Problem:  *GI bleed Active Problems:  HYPOTHYROIDISM, UNSPECIFIED  HYPERTENSION, BENIGN SYSTEMIC  Acute blood loss anemia  Weakness generalized  Abdominal pain, epigastric    Hospital Course 71 year old female with PMHx of Diverticulitis s/p hemicolectomy who presents with two episodes of clotting passed with stool.  She was recently hospitalized for this same issue, please refer to previous d/c for her hospital course.   1) Lower GI Bleed - Pt presented to the ED with reported two episodes of clots in her stool after being discharged from the hospital from a lengthy stay.  Her Hgb on admission was 8.9 (previous 4 Hgb before leaving were between 8.1-8.7)  A hemocult in the ED was positive as well and she was given one bolus of 500 cc NS.  She was started on Protonix 40 mg qd, zofran for nausea, and her aspirin was held.  Pt was transferred to the floor where she did well, her vitals were stable, and she did not report further episodes of passing clot in her stool.  Her Hgb and Hct were monitored daily and she did have one episode where she decreased to 6.8 with an increase in her heart rate.  She was tranfused one U of pRBC at this point and her Hgb increased and remained stable.  At the time of d/c it was 9.2.  Also, GI came by to evaluate the patient again.  They felt there was nothing else from their perspective that they could offer.  They recommended that she follow up with Dr. Dulce Sellar in the outpatient setting and if she continues to have bleeding that the option to have a total colectomy would be the only way to prevent further bleeding with her stool.    2) Hypertension - Stable, continued home meds.   3)  Hypothyroidism - Stable, continued home synthroid.           Discharge PE   Filed Vitals:   03/08/12 1714  BP: 123/58  Pulse: 82  Temp: 98 F (36.7 C)  Resp: 19   General: NAD, WN/WD, pleasant female  HEENT: [place]/AT, PERRLA, EOMI B/L, mucous membranes slightly dry  Cardiovascular: RRR, + 1/6 SEM LUSB  Respiratory: CTA B/L  Abdomen: NABS, Nontender, nondistended, soft, + incision midline from previous hemicolectomy  Extremities: Pulses + 2/4, slight pallor  Skin: No rashes  Neuro: AAO x 3, MS 5/5 B/L UE and LE        Procedures/Imaging:  Nm Gi Blood Loss  03/01/2012   IMPRESSION: Evidence of active bleeding centered in the right side of the abdomen.  Favored to be within the ascending colon with minimal retrograde movement into the cecum and antegrade movement into the transverse colon.  A distal small bowel source could look similar.  These results will be called to the ordering clinician or representative by the Radiologist Assistant, and communication documented in the PACS Dashboard.   Original Report Authenticated By: Consuello Bossier, M.D.     Ir Angiogram Visceral Selective  03/06/2012    IMPRESSION: A subtle focus of bleeding from the right colic artery was delineated during the first half the procedure.  During the exhausted efforts to achieve embolization, the contrast extravasation resolved.  Please note that anatomy is markedly  distorted secondary to prior surgery.  There is also a common origin for the celiac and SMA.   Original Report Authenticated By: Donavan Burnet, M.D.      Labs  CBC  Lab 03/12/12 0640 03/11/12 0655 03/10/12 1546 03/09/12 0846  WBC 8.7 7.6 -- 7.6  HGB 9.2* 8.3* 8.9* --  HCT 28.8* 25.6* 26.7* --  PLT 454* 360 -- 291   BMET  Lab 03/12/12 0640 03/08/12 0121 03/06/12 0256  NA 142 142 140  K 4.1 4.0 4.3  CL 109 106 111  CO2 27 27 24   BUN 21 19 16   CREATININE 1.11* 1.22* 1.19*  CALCIUM 9.4 9.3 8.3*  PROT -- 5.9* --  BILITOT -- 0.3 --    ALKPHOS -- 102 --  ALT -- 26 --  AST -- 21 --  GLUCOSE 81 92 99        Patient condition at time of discharge/disposition: stable  Disposition-home   Follow up issues: 1. Please see how patient is doing with regard to bleeding with her stool.  Consider repeating Hgb.  2. Please discuss with patient total colectomy to prevent further episodes of bleeding and presentation to the ED.   3. F/u Weight loss.  Pending TSH/T4 upon d/c.   Discharge follow up:   Discharge Orders    Future Appointments: Provider: Department: Dept Phone: Center:   03/13/2012 1:45 PM Dessa Phi, MD [facility] Res[facility] [phone] [facility]     Future Orders Please Complete By Expires   Diet - low sodium heart healthy      Increase activity slowly        Follow-up Information    Follow up with Dessa Phi, MD. (Tuesday 10/29 @ 1:45 PM)    Contact information:   [address] [place] [address] [phone]       Schedule an appointment as soon as possible for a visit with Freddy Jaksch, MD.   Contact information:   [address] [place] [address] [phone]           Discharge Instructions: Please refer to Patient Instructions section of EMR for full details.  Patient was counseled important signs and symptoms that should prompt return to medical care, changes in medications, dietary instructions, activity restrictions, and follow up appointments.  Significant instructions noted below:    Discharge Medications   Medication List     As of 03/12/2012  2:34 PM    START taking these medications         mirtazapine 7.5 MG tablet   Commonly known as: REMERON   Take 1 tablet (7.5 mg total) by mouth at bedtime.      CONTINUE taking these medications         acetaminophen 500 MG tablet   Commonly known as: TYLENOL      calcium-vitamin D 500-200 MG-UNIT per tablet   Take 1 tablet by mouth 2 (two) times daily with a meal.      famotidine 20 MG  tablet   Commonly known as: PEPCID      hydrochlorothiazide 25 MG tablet   Commonly known as: HYDRODIURIL   Take 1 tablet (25 mg total) by mouth daily.      levothyroxine 75 MCG tablet   Commonly known as: SYNTHROID, LEVOTHROID   Take 1 tablet (75 mcg total) by mouth daily.      lisinopril 40 MG tablet   Commonly known as: PRINIVIL,ZESTRIL   Take 1 tablet (40 mg  total) by mouth daily.      pravastatin 80 MG tablet   Commonly known as: PRAVACHOL   Take 1 tablet (80 mg total) by mouth daily.      STOP taking these medications         ranitidine 150 MG tablet   Commonly known as: ZANTAC          Where to get your medications    These are the prescriptions that you need to pick up. We sent them to a specific pharmacy, so you will need to go there to get them.   [facility] [address], [place] - [address]    [address] [place] [address]    Phone: [phone]        mirtazapine 7.5 MG tablet                Gildardo Cranker, DO of [facility] 03/12/2012 2:34 PM

## 2012-03-08 NOTE — Telephone Encounter (Signed)
Mrs. Rizo son [name] is calling to let me know that his mother, Beth Harper, who was released today after being hospitalized for GI bleeding, is now passing clots in her stool and feeling weak. I advised that it was best to come to the Emergency Department for further evaluation. He is in agreement and will bring her.

## 2012-03-09 ENCOUNTER — Inpatient Hospital Stay: Payer: Medicare Other | Admitting: Family Medicine

## 2012-03-09 DIAGNOSIS — R634 Abnormal weight loss: Secondary | ICD-10-CM

## 2012-03-09 DIAGNOSIS — R531 Weakness: Secondary | ICD-10-CM

## 2012-03-09 DIAGNOSIS — R5381 Other malaise: Secondary | ICD-10-CM

## 2012-03-09 LAB — CBC WITH DIFFERENTIAL/PLATELET
Basophils Relative: 1 % (ref 0–1)
Eosinophils Absolute: 0.2 10*3/uL (ref 0.0–0.7)
Eosinophils Relative: 2 % (ref 0–5)
Hemoglobin: 7.8 g/dL — ABNORMAL LOW (ref 12.0–15.0)
MCH: 29.5 pg (ref 26.0–34.0)
MCHC: 33.2 g/dL (ref 30.0–36.0)
MCV: 89 fL (ref 78.0–100.0)
Monocytes Relative: 10 % (ref 3–12)
Neutrophils Relative %: 62 % (ref 43–77)
Platelets: 291 10*3/uL (ref 150–400)

## 2012-03-09 LAB — HEMOGLOBIN AND HEMATOCRIT, BLOOD: HCT: 24.9 % — ABNORMAL LOW (ref 36.0–46.0)

## 2012-03-09 MED ORDER — MIRTAZAPINE 7.5 MG PO TABS
7.5000 mg | ORAL_TABLET | Freq: Every day | ORAL | Status: DC
  Administered 2012-03-09 – 2012-03-13 (×5): 7.5 mg via ORAL
  Filled 2012-03-09 (×6): qty 1

## 2012-03-09 MED ORDER — BOOST / RESOURCE BREEZE PO LIQD
1.0000 | Freq: Three times a day (TID) | ORAL | Status: DC
  Administered 2012-03-09 – 2012-03-14 (×12): 1 via ORAL

## 2012-03-09 MED ORDER — WHITE PETROLATUM GEL
Status: AC
  Administered 2012-03-09: 11:00:00
  Filled 2012-03-09: qty 5

## 2012-03-09 NOTE — Consult Note (Signed)
Patient ZO:XWRUEAV Presswood      DOB: April 26, 1941      WUJ:[id number]     Consult Note from the Palliative Medicine Team at [facility]    Consult Requested by: Dr Sheffield Slider     PCP: Dessa Phi, MD Reason for Consultation:Clarification of GOC and options     Phone Number:[phone]  Assessment of patients Current state: PMH for diverticulosis s/p hemicolectomy 11 yrs ago, two recent episodes of bloody stools, 30 lb wt loss, admitted for work-up       This NP Lorinda Creed reviewed medical records, received report from team, assessed the patient and then meet at the patient's bedside along with Vevelyn Pat # [phone] and Ambrose Mantle  to discuss diagnosis prognosis, GOC, EOL wishes disposition and options.   A detailed discussion was had today regarding advanced directives.  Concepts specific to code status, artifical feeding and hydration, continued IV antibiotics and rehospitalization was had.  The difference between a aggressive medical intervention path  and a palliative comfort care path for this patient at this time was had.  Values and goals of care important to patient and family were attempted to be elicited.  Concept of Hospice and Palliative Care were discussed     PMT will continue to support holistically.   Goals of Care: 1.  Code Status: DNR/DNI   2. Scope of Treatment:  Patient and family would like to further investigate significant weight loss over the past year, 30lbs.  She wants to "feel better and stronger"   1. Vital Signs: per unit  2. Respiratory/Oxygen: as needed 3. Nutritional Support/Tube Feeds:   -add Resource TID  4. Antibiotics: as needed to treat 5. Blood Products:if needed 6. IVF: as needed 7. Review of Medications to be discontinued: as needed for treatment 8. Labs: yes 9. Telemetry: as needed 10. Consults: Nutritional consult/GI consult   4. Disposition: Hopeful for discharge home in a healthy state   3. Symptom  Management:   1. Weight loss: Remeron 7.5 mg q hs    -nutrition consult     -add resource - discussed frequent small feeding/ high caloric protein foods -further GI workup   2. Depressive Symptoms- Remeron 7.5 mg qhs  4. Psychosocial:  Complex psychosocial situation.  Veronica Lett LCSW participated in meeting.  See her note  5. Spiritual:  Strong community church support         Brief HPI: PMH for diverticulosis s/p hemicolectomy 11 yrs ago, two recent episodes of bloody stools, 30 lb wt loss, admitted for work-up   ROS: weakness, loss of appetite, 30 lb wt loss    PMH:  Past Medical History  Diagnosis Date  . Hypertension   . Diverticula, small intestine   . Hyperlipidemia   . Hypothyroidism      PSH: Past Surgical History  Procedure Date  . Colon surgery 09/22/2000    left hemicolectomy -Dr Magnus Ivan  . Appendectomy 09/22/2000  . Colonoscopy 03/01/2012    Procedure: COLONOSCOPY;  Surgeon: Graylin Shiver, MD;  Location: [facility] ENDOSCOPY;  Service: Endoscopy;  Laterality: N/A;  Pat  . Colostomy takedown 2002    Dr Magnus Ivan   I have reviewed the [facility] and [facility] and  If appropriate update it with new information. No Known Allergies Scheduled Meds:   . calcium-vitamin D  1 tablet Oral BID WC  . famotidine  20 mg Oral BID  . hydrochlorothiazide  25 mg Oral Daily  . levothyroxine  75 mcg Oral Daily  .  lisinopril  40 mg Oral Daily  . pantoprazole  40 mg Oral Daily  . simvastatin  40 mg Oral q1800  . white petrolatum       Continuous Infusions:   . 0.9 % NaCl with KCl 20 mEq / L 75 mL/hr at 03/09/12 1026   PRN Meds:.acetaminophen    BP 119/72  Pulse 75  Temp 97.9 F (36.6 C) (Oral)  Resp 18  Ht 5\' 1"  (1.549 m)  Wt 52.844 kg (116 lb 8 oz)  BMI 22.01 kg/m2  SpO2 100%   PPS: 60%   Intake/Output Summary (Last 24 hours) at 03/09/12 1455 Last data filed at 03/09/12 0155  Gross per 24 hour  Intake 1868.75 ml  Output      0 ml  Net 1868.75 ml   LBM:  03-09-12                       Physical Exam:  General: NAD, thin frail black female HEENT:  + temporal muscle wasting Chest:   CTA CVS: RRR Abdomen:soft NT +BS Ext: without edema Neuro: alert and oriented # engaged in conversation  Labs: CBC    Component Value Date/Time   WBC 7.6 03/09/2012 0846   RBC 2.64* 03/09/2012 0846   HGB 8.1* 03/09/2012 1339   HCT 24.9* 03/09/2012 1339   PLT 291 03/09/2012 0846   MCV 89.0 03/09/2012 0846   MCH 29.5 03/09/2012 0846   MCHC 33.2 03/09/2012 0846   RDW 14.4 03/09/2012 0846   LYMPHSABS 1.9 03/09/2012 0846   MONOABS 0.8 03/09/2012 0846   EOSABS 0.2 03/09/2012 0846   BASOSABS 0.1 03/09/2012 0846    BMET    Component Value Date/Time   NA 142 03/08/2012 0121   K 4.0 03/08/2012 0121   CL 106 03/08/2012 0121   CO2 27 03/08/2012 0121   GLUCOSE 92 03/08/2012 0121   BUN 19 03/08/2012 0121   CREATININE 1.22* 03/08/2012 0121   CREATININE 1.09 08/29/2011 1001   CALCIUM 9.3 03/08/2012 0121   GFRNONAA 43* 03/08/2012 0121   GFRAA 50* 03/08/2012 0121    CMP     Component Value Date/Time   NA 142 03/08/2012 0121   K 4.0 03/08/2012 0121   CL 106 03/08/2012 0121   CO2 27 03/08/2012 0121   GLUCOSE 92 03/08/2012 0121   BUN 19 03/08/2012 0121   CREATININE 1.22* 03/08/2012 0121   CREATININE 1.09 08/29/2011 1001   CALCIUM 9.3 03/08/2012 0121   PROT 5.9* 03/08/2012 0121   ALBUMIN 2.7* 03/08/2012 0121   AST 21 03/08/2012 0121   ALT 26 03/08/2012 0121   ALKPHOS 102 03/08/2012 0121   BILITOT 0.3 03/08/2012 0121   GFRNONAA 43* 03/08/2012 0121   GFRAA 50* 03/08/2012 0121       Time In Time Out Total Time Spent with Patient Total Overall Time  1400 1600 110 min 120 min    Greater than 50%  of this time was spent counseling and coordinating care related to the above assessment and plan.  Dr Donnetta Simpers notified of above  Gardiner Rhyme NP

## 2012-03-09 NOTE — Progress Notes (Signed)
PGY-1 Daily Progress Note Family Medicine Teaching Service [place] R. Kashayla Ungerer, DO Service Pager: [phone]   Subjective: Pt did well overnight and her last BM was yesterday AM without any blood.  No current complaints and is feeling better than she did yesterday.   Objective:  VITALS Temp:  [97.7 F (36.5 C)-98 F (36.7 C)] 98 F (36.7 C) (10/25 0519) Pulse Rate:  [70-82] 70  (10/25 0519) Resp:  [17-19] 17  (10/25 0519) BP: (118-146)/(58-112) 146/68 mmHg (10/25 0519) SpO2:  [96 %-100 %] 98 % (10/25 0519) Weight:  [116 lb 8 oz (52.844 kg)] 116 lb 8 oz (52.844 kg) (10/24 2227)  In/Out  Intake/Output Summary (Last 24 hours) at 03/09/12 0921 Last data filed at 03/09/12 0155  Gross per 24 hour  Intake 1868.75 ml  Output     75 ml  Net 1793.75 ml    Physical Exam: Filed Vitals:   03/09/12 0519  BP: 146/68  Pulse: 70  Temp: 98 F (36.7 C)  Resp: 17    General appearance: NAD Neck: no JVD and supple, symmetrical, trachea midline  Lungs: clear to auscultation bilaterally  Heart: RRR, S1, S2 normal, no murmur, click, rub or gallop  Abdomen: soft, non-tender; bowel sounds normal; no masses, no organomegaly  Extremities: extremities normal    MEDS Scheduled Meds:    . calcium-vitamin D  1 tablet Oral BID WC  . famotidine  20 mg Oral BID  . hydrochlorothiazide  25 mg Oral Daily  . levothyroxine  75 mcg Oral Daily  . lisinopril  40 mg Oral Daily  . pantoprazole  40 mg Oral Daily  . simvastatin  40 mg Oral q1800   Continuous Infusions:    . 0.9 % NaCl with KCl 20 mEq / L 75 mL/hr at 03/08/12 2042   PRN Meds:.acetaminophen  Labs and imaging:   CBC  Lab 03/08/12 1827 03/08/12 1515 03/08/12 1039 03/08/12 0121 03/07/12 0430 03/06/12 2000  WBC -- -- -- 5.4 5.2 5.9  HGB 8.5* 7.8* 8.0* -- -- --  HCT 26.6* 23.7* 23.9* -- -- --  PLT -- -- -- 234 188 176   BMET/CMET  Lab 03/08/12 0121 03/06/12 0256 03/05/12 0500 03/03/12 1315  NA 142 140 137 --  K 4.0 4.3 3.6 --    CL 106 111 110 --  CO2 27 24 23  --  BUN 19 16 14  --  CREATININE 1.22* 1.19* 1.09 --  CALCIUM 9.3 8.3* 8.1* --  PROT 5.9* -- -- 4.1*  BILITOT 0.3 -- -- 0.4  ALKPHOS 102 -- -- 28*  ALT 26 -- -- 10  AST 21 -- -- 20  GLUCOSE 92 99 100* --   Results for orders placed during the hospital encounter of 03/08/12 (from the past 24 hour(s))  HEMOGLOBIN AND HEMATOCRIT, BLOOD     Status: Abnormal   Collection Time   03/08/12 10:39 AM      Component Value Range   Hemoglobin 8.0 (*) 12.0 - 15.0 g/dL   HCT 45.4 (*) 09.8 - 11.9 %  HEMOGLOBIN AND HEMATOCRIT, BLOOD     Status: Abnormal   Collection Time   03/08/12  3:15 PM      Component Value Range   Hemoglobin 7.8 (*) 12.0 - 15.0 g/dL   HCT 14.7 (*) 82.9 - 56.2 %  HEMOGLOBIN AND HEMATOCRIT, BLOOD     Status: Abnormal   Collection Time   03/08/12  6:27 PM      Component Value Range  Hemoglobin 8.5 (*) 12.0 - 15.0 g/dL   HCT 95.6 (*) 21.3 - 08.6 %       Assessment  71 year old female with PMHx of Diverticulitis s/p hemicolectomy who presents with bright red blood per rectum:   1) GI Bleed, lower - Pt recently d/c with a LGIB and had two episodes since returning home.   1)Hgb has been stable over the last 24 hours around 8.  F/U CBC from this AM  2) Monitor vitals per unit.   3) Continue holding ASA, Zofran for nausea, Pepcid for GI PPx as well as Protonix 40 mg qd.   4) Will consider consulting IR for re-embolization or GI for possible total colectomy.   5) If has an episode of BRBPR, would consider transfusion as pt did have type and screen in the ED.   6) Will consult Palliative medicine today on goals of care. Input greatly appreciated and will follow up on their recommendations.    2) Hypertension - Stable, continue home meds.   3) Hypothyroidism- continue home synthroid.   4) FEN- SLIV 5) DVT PPX- SCD's (no anticoagulation due to bleeding)  6) Disposition- Continue to follow Hgb and Surgery/IR recs regarding her GI bleed.    Code Status: Full   Tamyra Fojtik R. Paulina Fusi, DO of [facility] 03/09/2012, 8:04 AM

## 2012-03-09 NOTE — Progress Notes (Signed)
Palliative Medicine Team SW Present for GOC meeting with PMT NP Lorinda Creed, pt and two of her sons Beth Harper and Beth Harper. Extensive conversation about PMH, current medical concerns, and changes in pt's living environment. Pt reportedly was living with oldest son Beth Harper for ~4 years and recently moved in with [name] in [place], leaving Beth Harper without housing here in [place]. Apparent that there is some familial discord regarding Beth Harper's past and current involvement, but all are working together for pt's best interest. Pt also is adoptive guardian of her 73 y/o grand-daugther [name] who was removed from her mother's home in [place] 8 years ago.   Today's discussion centered around pt and family's desire to continue further medical workup regarding weight loss and GI issues. Family made aware that this may be the first of continued conversations about advance directives. Pt able to express her desire for a natural death, NP to change to DNR status. Pt also expressed her feelings that her previous colectomy was a true detriment to her quality of life and self-image, would like to avoid another one if possible, but would be open to further discussion as needed. Provided guidance on other decisions to be discussed between family such as Living Will, HCPOA, and legal planning for care of granddaughter. Provided advance directive packet and Hard Choices booklet. Pt did report that should she not be able to speak for herself, she would want son Beth Harper to make choices for her.   Also discussed pt's depressive sx; pt endorsed decreased appetite, social withdrawal, hypersomnia, and general avoidance of addressing her own medical needs/concerns. Pt often worries about the wellbeing of her sons and granddtr, and affirms Horace's statement that she "enables" son [name]. Pt open to consideration of suggestion of antidepressant and to encouragement to accept the offers of family and church support. 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 Stressed the importance of emotional support as she navigates the family's underlying psychosocial concerns. Pt may also benefit from follow up from [facility] if support available through her primary care.   Pt and family very appreciative of meeting and support. PMT NP to follow up tomorrow.   Kennieth Francois, [facility] Team Phone [phone]

## 2012-03-10 LAB — HEMOGLOBIN AND HEMATOCRIT, BLOOD
HCT: 20.7 % — ABNORMAL LOW (ref 36.0–46.0)
Hemoglobin: 6.8 g/dL — CL (ref 12.0–15.0)
Hemoglobin: 8.9 g/dL — ABNORMAL LOW (ref 12.0–15.0)
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
## 2012-03-10 NOTE — Progress Notes (Signed)
I interviewed and examined this patient and discussed the care plan with Dr. Paulina Fusi and the [facility] team and agree with assessment and plan as documented in the progress note for today.  Palliative care's consultation is appreciated. [name] says she will go along with our recommendations and will accept having surgery if that is the best option.     Beth Harper A. Sheffield Slider, MD Family Medicine Teaching Service Attending  03/10/2012 10:21 AM

## 2012-03-10 NOTE — Progress Notes (Signed)
Progress Note from the Palliative Medicine Team at [facility]  Subjective: Patient alert and oriented, son [name] at bedside.  Patient "now what is going on with me", concerned with decreased hemoglobin and blood transfusion need  Discussed with patient the possible options as it relates to weight loss and low hemaglobin   Objective: No Known Allergies Scheduled Meds:   . calcium-vitamin D  1 tablet Oral BID WC  . famotidine  20 mg Oral BID  . feeding supplement  1 Container Oral TID BM  . hydrochlorothiazide  25 mg Oral Daily  . levothyroxine  75 mcg Oral Daily  . lisinopril  40 mg Oral Daily  . mirtazapine  7.5 mg Oral QHS  . pantoprazole  40 mg Oral Daily  . simvastatin  40 mg Oral q1800   Continuous Infusions:   . 0.9 % NaCl with KCl 20 mEq / L 75 mL/hr at 03/10/12 0041   PRN Meds:.acetaminophen  BP 139/72  Pulse 71  Temp 98.4 F (36.9 C) (Oral)  Resp 18  Ht 5\' 1"  (1.549 m)  Wt 52.935 kg (116 lb 11.2 oz)  BMI 22.05 kg/m2  SpO2 100%   PPS: 70 %  Pain Score: denies Pain Location   Intake/Output Summary (Last 24 hours) at 03/10/12 1448 Last data filed at 03/10/12 1053  Gross per 24 hour  Intake   1777 ml  Output      0 ml  Net   1777 ml      Physical Exam:  General:  Frail, weak looking NAD HEENT:  + temporal and facial muscle wasting Chest:   CTA CVS: RRR Abdomen: soft NT +BS Ext: without edema Neuro:Alert and oriented, engaged in conversation   Labs: CBC    Component Value Date/Time   WBC 7.6 03/09/2012 0846   RBC 2.64* 03/09/2012 0846   HGB 6.8* 03/10/2012 0524   HCT 20.7* 03/10/2012 0524   PLT 291 03/09/2012 0846   MCV 89.0 03/09/2012 0846   MCH 29.5 03/09/2012 0846   MCHC 33.2 03/09/2012 0846   RDW 14.4 03/09/2012 0846   LYMPHSABS 1.9 03/09/2012 0846   MONOABS 0.8 03/09/2012 0846   EOSABS 0.2 03/09/2012 0846   BASOSABS 0.1 03/09/2012 0846    BMET    Component Value Date/Time   NA 142 03/08/2012 0121   K 4.0 03/08/2012 0121   CL  106 03/08/2012 0121   CO2 27 03/08/2012 0121   GLUCOSE 92 03/08/2012 0121   BUN 19 03/08/2012 0121   CREATININE 1.22* 03/08/2012 0121   CREATININE 1.09 08/29/2011 1001   CALCIUM 9.3 03/08/2012 0121   GFRNONAA 43* 03/08/2012 0121   GFRAA 50* 03/08/2012 0121    CMP     Component Value Date/Time   NA 142 03/08/2012 0121   K 4.0 03/08/2012 0121   CL 106 03/08/2012 0121   CO2 27 03/08/2012 0121   GLUCOSE 92 03/08/2012 0121   BUN 19 03/08/2012 0121   CREATININE 1.22* 03/08/2012 0121   CREATININE 1.09 08/29/2011 1001   CALCIUM 9.3 03/08/2012 0121   PROT 5.9* 03/08/2012 0121   ALBUMIN 2.7* 03/08/2012 0121   AST 21 03/08/2012 0121   ALT 26 03/08/2012 0121   ALKPHOS 102 03/08/2012 0121   BILITOT 0.3 03/08/2012 0121   GFRNONAA 43* 03/08/2012 0121   GFRAA 50* 03/08/2012 0121    Assessment and Plan: 1. Code Status: DNR/DNI 2. Symptom Management      - Weight loss/fatigue/ weakness     -Depressive symptom  3. Psycho/Social: Continued conversation addressing the importance of anticipatory planning as it relates to present medical situation.  Awaiting SW to assist with advanced directives, blue book in room 4. Spiritual  Strong community church support 5. Disposition: Hopeful for home when stable and "well"     Time In Time Out Total Time Spent with Patient Total Overall Time  1525 1600 35 min 35 min    Greater than 50%  of this time was spent counseling and coordinating care related to the above assessment and plan.  Lorinda Creed NP  [phone]   1

## 2012-03-10 NOTE — Progress Notes (Signed)
I interviewed and examined this patient and discussed the care plan with Dr. Paulina Fusi and the [facility] team and agree with assessment and plan as documented in the progress note for today.    Danzel Marszalek A. Sheffield Slider, MD Family Medicine Teaching Service Attending  03/10/2012 11:54 AM

## 2012-03-10 NOTE — Progress Notes (Signed)
PGY-1 Daily Progress Note Family Medicine Teaching Service Souris R. Ravindra Baranek, DO Service Pager: [phone]   Subjective: Pt did well overnight and her last BM was yesterday AM without any blood.  No current complaints and is feeling better than she did yesterday.   Objective:  VITALS Temp:  [97.8 F (36.6 C)-98.3 F (36.8 C)] 97.8 F (36.6 C) (10/26 0551) Pulse Rate:  [62-86] 62  (10/26 0551) Resp:  [16-18] 16  (10/26 0551) BP: (109-141)/(55-82) 114/59 mmHg (10/26 0551) SpO2:  [98 %-100 %] 100 % (10/26 0551) Weight:  [116 lb 11.2 oz (52.935 kg)] 116 lb 11.2 oz (52.935 kg) (10/25 2148)  In/Out  Intake/Output Summary (Last 24 hours) at 03/10/12 0718 Last data filed at 03/10/12 0600  Gross per 24 hour  Intake   1065 ml  Output      0 ml  Net   1065 ml    Physical Exam: Filed Vitals:   03/10/12 0551  BP: 114/59  Pulse: 62  Temp: 97.8 F (36.6 C)  Resp: 16    General appearance: NAD Neck: no JVD and supple, symmetrical, trachea midline  Lungs: clear to auscultation bilaterally  Heart: RRR, S1, S2 normal, no murmur, click, rub or gallop  Abdomen: soft, non-tender; bowel sounds normal; no masses, no organomegaly  Extremities: extremities normal    MEDS Scheduled Meds:    . calcium-vitamin D  1 tablet Oral BID WC  . famotidine  20 mg Oral BID  . feeding supplement  1 Container Oral TID BM  . hydrochlorothiazide  25 mg Oral Daily  . levothyroxine  75 mcg Oral Daily  . lisinopril  40 mg Oral Daily  . mirtazapine  7.5 mg Oral QHS  . pantoprazole  40 mg Oral Daily  . simvastatin  40 mg Oral q1800  . white petrolatum       Continuous Infusions:    . 0.9 % NaCl with KCl 20 mEq / L 75 mL/hr at 03/10/12 0041   PRN Meds:.acetaminophen  Labs and imaging:   CBC  Lab 03/10/12 0524 03/09/12 1339 03/09/12 0846 03/08/12 0121 03/07/12 0430  WBC -- -- 7.6 5.4 5.2  HGB 6.8* 8.1* 7.8* -- --  HCT 20.7* 24.9* 23.5* -- --  PLT -- -- 291 234 188   BMET/CMET  Lab 03/08/12  0121 03/06/12 0256 03/05/12 0500 03/03/12 1315  NA 142 140 137 --  K 4.0 4.3 3.6 --  CL 106 111 110 --  CO2 27 24 23  --  BUN 19 16 14  --  CREATININE 1.22* 1.19* 1.09 --  CALCIUM 9.3 8.3* 8.1* --  PROT 5.9* -- -- 4.1*  BILITOT 0.3 -- -- 0.4  ALKPHOS 102 -- -- 28*  ALT 26 -- -- 10  AST 21 -- -- 20  GLUCOSE 92 99 100* --   Results for orders placed during the hospital encounter of 03/08/12 (from the past 24 hour(s))  CBC WITH DIFFERENTIAL     Status: Abnormal   Collection Time   03/09/12  8:46 AM      Component Value Range   WBC 7.6  4.0 - 10.5 K/uL   RBC 2.64 (*) 3.87 - 5.11 MIL/uL   Hemoglobin 7.8 (*) 12.0 - 15.0 g/dL   HCT 30.8 (*) 65.7 - 84.6 %   MCV 89.0  78.0 - 100.0 fL   MCH 29.5  26.0 - 34.0 pg   MCHC 33.2  30.0 - 36.0 g/dL   RDW 96.2  95.2 - 84.1 %  Platelets 291  150 - 400 K/uL   Neutrophils Relative 62  43 - 77 %   Neutro Abs 4.7  1.7 - 7.7 K/uL   Lymphocytes Relative 25  12 - 46 %   Lymphs Abs 1.9  0.7 - 4.0 K/uL   Monocytes Relative 10  3 - 12 %   Monocytes Absolute 0.8  0.1 - 1.0 K/uL   Eosinophils Relative 2  0 - 5 %   Eosinophils Absolute 0.2  0.0 - 0.7 K/uL   Basophils Relative 1  0 - 1 %   Basophils Absolute 0.1  0.0 - 0.1 K/uL  HEMOGLOBIN AND HEMATOCRIT, BLOOD     Status: Abnormal   Collection Time   03/09/12  1:39 PM      Component Value Range   Hemoglobin 8.1 (*) 12.0 - 15.0 g/dL   HCT 16.1 (*) 09.6 - 04.5 %  HEMOGLOBIN AND HEMATOCRIT, BLOOD     Status: Abnormal   Collection Time   03/10/12  5:24 AM      Component Value Range   Hemoglobin 6.8 (*) 12.0 - 15.0 g/dL   HCT 40.9 (*) 81.1 - 91.4 %       Assessment  71 year old female with PMHx of Diverticulitis s/p hemicolectomy who presents with bright red blood per rectum:   1) GI Bleed, lower - Pt recently d/c with a LGIB and had two episodes since returning home.   1)Hgb did decrease to 6.8 this AM.  Pt denies acute episodes of blood in stool.  Not having Sx at this point either including  dizziness, lightheaded, tachycardia. Will transfuse 1 uPRBC and recheck Hgb s/p transfusion.    2) Monitor vitals per unit.   3) Continue holding ASA, Zofran for nausea, Pepcid for GI PPx as well as Protonix 40 mg qd.   4) Will consider consulting IR for re-embolization or GI for possible total colectomy.  This may be the next step as her Hgb did drop today.    5) Continue to follow palliative's recommendations.    2) Hypertension - Stable, continue home meds.   1)Starting to become hypotensive.  If recheck shows this, consider d/c her Anti-HTN medications.   3) Hypothyroidism- continue home synthroid.   4) Depression - Started on Remeron 7.5 mg qhs.  5) Protein Caloric Malnutrition   1) Will get nutrition consult today.  2) Started on Remeron 7.5 mg qhs yesterday per palliative medicine.   5) FEN- 75 cc/hr NS + 20 meq KCL 7) DVT PPX- SCD's (no anticoagulation due to bleeding)  8) Disposition- Continue to follow Hgb and Surgery/IR recs regarding her GI bleed.   Code Status: Full   Excell Neyland R. Paulina Fusi, DO of [facility] 03/10/2012, 7:18 AM

## 2012-03-10 NOTE — Progress Notes (Addendum)
CRITICAL VALUE ALERT  Critical value received:  Results for Beth Harper, Beth Harper (MRN [id number]) as of 03/10/2012 06:34  Ref. Range 03/10/2012 05:24  Hemoglobin Latest Range: 12.0-15.0 g/dL 6.8 (LL)    Date of notification:    Time of notification:    Critical value read back yes  Nurse who received alert:  E C RN  MD notified (1st page):   Time of first page: [phone]  MD notified (2nd page):  Time of second [id number]  Responding MD:    Time MD responded:  Still awaiting call back    PGY 1 Update:  Made aware of pt's Hgb but no call back number.  Went to see pt and she is feeling well.  Slightly tachycardic so will go ahead and transfuse one u PRBC and recheck H & H.  Twana First Paulina Fusi, DO of [facility] 03/10/2012, 7:16 AM

## 2012-03-10 NOTE — Progress Notes (Signed)
Patient had a 6 beat run of V-Tach. Patient asymptomatic, was resting in bed. MD notified. No orders given. Will continue to monitor. Steele Berg RN

## 2012-03-11 LAB — TYPE AND SCREEN: ABO/RH(D): AB POS

## 2012-03-11 LAB — CBC
MCHC: 32.4 g/dL (ref 30.0–36.0)
Platelets: 360 10*3/uL (ref 150–400)
RDW: 14.6 % (ref 11.5–15.5)
WBC: 7.6 10*3/uL (ref 4.0–10.5)

## 2012-03-11 NOTE — Progress Notes (Signed)
Daily Progress Note Family Medicine Teaching Service Service Pager: [phone]   Subjective: Pt did well overnight.  No BM yesterday.  Some occasional abdominal cramps, but nothing too severe.  Eating is going well.    Objective:  VITALS Temp:  [97.8 F (36.6 C)-99.1 F (37.3 C)] 98 F (36.7 C) (10/27 0509) Pulse Rate:  [66-82] 71  (10/27 0509) Resp:  [16-18] 18  (10/27 0509) BP: (106-139)/(50-74) 138/74 mmHg (10/27 0509) SpO2:  [94 %-100 %] 100 % (10/27 0509) Weight:  [116 lb 6.5 oz (52.8 kg)] 116 lb 6.5 oz (52.8 kg) (10/26 2013)  In/Out  Intake/Output Summary (Last 24 hours) at 03/11/12 0810 Last data filed at 03/11/12 0657  Gross per 24 hour  Intake 1408.25 ml  Output      0 ml  Net 1408.25 ml    Physical Exam: General appearance: NAD, sleeping comfortably prior to exam Lungs: clear to auscultation bilaterally  Heart: RRR, S1, S2 normal, no murmur, click, rub or gallop  Abdomen: soft, non-tender; bowel sounds normal; no masses, no organomegaly  Extremities: extremities normal, no edema  MEDS I have reviewed her medications.  Labs and imaging:   CBC  Lab 03/11/12 0655 03/10/12 1546 03/10/12 0524 03/09/12 0846 03/08/12 0121  WBC 7.6 -- -- 7.6 5.4  HGB 8.3* 8.9* 6.8* -- --  HCT 25.6* 26.7* 20.7* -- --  PLT 360 -- -- 291 234   BMET/CMET  Lab 03/08/12 0121 03/06/12 0256 03/05/12 0500  NA 142 140 137  K 4.0 4.3 3.6  CL 106 111 110  CO2 27 24 23   BUN 19 16 14   CREATININE 1.22* 1.19* 1.09  CALCIUM 9.3 8.3* 8.1*  PROT 5.9* -- --  BILITOT 0.3 -- --  ALKPHOS 102 -- --  ALT 26 -- --  AST 21 -- --  GLUCOSE 92 99 100*    Assessment  71 year old female with PMHx of Diverticulitis s/p hemicolectomy who presents with bright red blood per rectum:   1) GI Bleed, lower - Pt recently d/c with a LGIB and had two episodes since returning home.   1)Hgb stable this morning at 8.3 following transfusion of 1 unit pRBCs yesterday.    2) Monitor vitals per unit.   3)  Continue holding ASA, Zofran for nausea, Pepcid for GI PPx as well as Protonix 40 mg qd.   4) Next step would be surgery for possible total colectomy.  Patient aware that we do not have any way of predicting when she will next bleed.    5) Continue to follow palliative's recommendations.    2) Hypertension - Stable, continue home meds.   3) Hypothyroidism- continue home synthroid.   4) Possible Depression - Geriatric depression scale negative for depression.  5) Severe Protein Calorie Malnutrition   1) Appreciate nutrition recommendations.  2) Continue Remeron 7.5 mg qhs for appetite.  3) Will check renal panel and magnesium level tomorrow to monitor for refeeding syndrome.   5) FEN- 75 cc/hr NS + 20 meq KCL 7) DVT PPX- SCD's (no anticoagulation due to bleeding)  8) Disposition- Continue to follow Hgb.  Anticipate either d/c home or surgery consult in next 24-48 hours.  Code Status: DNR   BOOTH, Robert Sunga 03/11/2012, 8:10 AM

## 2012-03-11 NOTE — Progress Notes (Addendum)
Patient ID: Beth Harper, female   DOB: [birth year], 71 y.o.   MRN: [id number]   [facility] Progress Note  Beth Harper 71 y.o. 1941-01-29   Subjective: No bleeding since readmission. No complaints.  Objective: Vital signs: Filed Vitals:   03/11/12 1038  BP: 125/66  Pulse: 80  Temp: 98.1 F (36.7 C)  Resp: 18    Physical Exam: Gen: alert, no acute distress  Abd: soft, nontender, nondistended   Hgb 8.3 (8.9, 6.8) -(s/p 1 U PRBCs with Hgb going from 6.8 to 8.9)    Assessment/Plan:  Came by to see Beth Harper since she came back in for recurrent GI bleeding that occurred within 6 hours of being discharged last week. Son states blood with clots happened 3 times that day but none since. Reports one nonbloody stool since admit. Colonoscopy on 03/01/12 showed diffuse diverticulosis and this bleeding is diverticular in nature. Pt is s/p what sounds like a left hemicolectomy or partial left colonic resection in 2001 from complicated diverticulitis. She is NOT currently bleeding and an angiogram would not be helpful at this time. If she rebleeds significantly, then may need to reconsider an angiogram as the next step and if not helpful then surgery would be the final option for removal of the remaining portion of her colon. Would not recommend a repeat colonoscopy due to the number of diverticuli she has and the low likelihood of pinpointing, which one is bleeding at that moment. I would favor conservative management with following H/Hs and if stable let her go home in 1-2 days and if rebleeding occurs again then seek surgical opinion at that juncture. She does NOT want surgery unless no other option but is worried that as soon as she gets home again the bleeding will start back up. Answered her and her son's questions. Call us back if needed. Dr. Dulce Sellar available to see this week if needed.    Beth Harper C. 03/11/2012, 12:20 PM

## 2012-03-11 NOTE — Progress Notes (Signed)
I interviewed and examined this patient and discussed the care plan with Dr. Elwyn Reach and the [facility] team and agree with assessment and plan as documented in the progress note for today.     Miriam Kestler A. Sheffield Slider, MD Family Medicine Teaching Service Attending  03/11/2012 8:37 PM

## 2012-03-12 DIAGNOSIS — R1013 Epigastric pain: Secondary | ICD-10-CM

## 2012-03-12 LAB — RENAL FUNCTION PANEL
Albumin: 2.6 g/dL — ABNORMAL LOW (ref 3.5–5.2)
CO2: 27 mEq/L (ref 19–32)
Calcium: 9.4 mg/dL (ref 8.4–10.5)
Chloride: 109 mEq/L (ref 96–112)
Creatinine, Ser: 1.11 mg/dL — ABNORMAL HIGH (ref 0.50–1.10)
GFR calc Af Amer: 56 mL/min — ABNORMAL LOW (ref >90)
GFR calc non Af Amer: 48 mL/min — ABNORMAL LOW (ref >90)
Sodium: 142 mEq/L (ref 135–145)

## 2012-03-12 LAB — CBC
Hemoglobin: 9.2 g/dL — ABNORMAL LOW (ref 12.0–15.0)
MCHC: 31.9 g/dL (ref 30.0–36.0)
Platelets: 454 10*3/uL — ABNORMAL HIGH (ref 150–400)
RBC: 3.15 MIL/uL — ABNORMAL LOW (ref 3.87–5.11)

## 2012-03-12 LAB — MAGNESIUM: Magnesium: 1.8 mg/dL (ref 1.5–2.5)

## 2012-03-12 MED ORDER — MIRTAZAPINE 7.5 MG PO TABS: 7.5000 mg | ORAL_TABLET | Freq: Every day | ORAL | Status: DC

## 2012-03-12 MED ORDER — TRAMADOL HCL 50 MG PO TABS
50.0000 mg | ORAL_TABLET | Freq: Four times a day (QID) | ORAL | Status: DC | PRN
  Administered 2012-03-12 – 2012-03-13 (×2): 50 mg via ORAL
  Filled 2012-03-12 (×4): qty 1

## 2012-03-12 MED ORDER — GLYCERIN (LAXATIVE) 2.1 G RE SUPP
1.0000 | Freq: Once | RECTAL | Status: AC
  Administered 2012-03-12: 22:00:00 via RECTAL
  Filled 2012-03-12: qty 1

## 2012-03-12 NOTE — Progress Notes (Addendum)
Palliative Medicine Team SW Brief psychosocial follow up earlier today at which time pt receiving care from RN student. Pt did report good spirits and denied acute distress.   Later spoke extensively with pt's son in hallway about his fears surrounding plans to d/c pt. Son expressed his desire to avoid conflict with staff and his understanding of his lack of medical expertise, but reports concerns about pt being d/c'd too soon and the potential need rehospitalization. Son also expressed several concerns and feelings of dissatisfaction with RN response time and general lack of trust in care received. With son's consent, passed along to unit nursing director. Empathized with the added burden of having to be a vigilant advocate for pt whilst also being concerned about wellbeing of pt. Son appreciative of opportunity to express concerns, will follow up.   Beth Harper, [place] Pager [phone]

## 2012-03-12 NOTE — Progress Notes (Signed)
Progress Note from the Palliative Medicine Team at [facility]  Subjective: Patient alert and oriented  C/O new pain/discomfort described as "spasm" in epigastric region  Patient "hopes" that the hospital will investigate her issues as it relates to significant weight loss, anemia and now abdominal pain   Son, [name] at bedside.   Objective: No Known Allergies Scheduled Meds:   . calcium-vitamin D  1 tablet Oral BID WC  . famotidine  20 mg Oral BID  . feeding supplement  1 Container Oral TID BM  . hydrochlorothiazide  25 mg Oral Daily  . levothyroxine  75 mcg Oral Daily  . lisinopril  40 mg Oral Daily  . mirtazapine  7.5 mg Oral QHS  . pantoprazole  40 mg Oral Daily  . simvastatin  40 mg Oral q1800   Continuous Infusions:   . DISCONTD: 0.9 % NaCl with KCl 20 mEq / L 75 mL/hr at 03/12/12 0622   PRN Meds:.acetaminophen  BP 141/68  Pulse 75  Temp 97.6 F (36.4 C) (Oral)  Resp 18  Ht 5\' 1"  (1.549 m)  Wt 55.4 kg (122 lb 2.2 oz)  BMI 23.08 kg/m2  SpO2 100%   PPS:60&  Pain Score: "spasm" Pain Location epigastric region   Intake/Output Summary (Last 24 hours) at 03/12/12 1048 Last data filed at 03/12/12 0900  Gross per 24 hour  Intake 1982.5 ml  Output      0 ml  Net 1982.5 ml      LBM:03-13-12     Physical Exam:  General: frail, pleasant black female NAD HEENT:  + facial and temporal muscle wasting, moist buccal membranse Chest:   CTA CVS: RRR Abdomen: noted area of extreme tenderness mid/epigastic region, dense area approximately 5 cm noted painful to gentle touch, +BS Ext: without edema  Neuro: oriented X3  Labs: CBC    Component Value Date/Time   WBC 8.7 03/12/2012 0640   RBC 3.15* 03/12/2012 0640   HGB 9.2* 03/12/2012 0640   HCT 28.8* 03/12/2012 0640   PLT 454* 03/12/2012 0640   MCV 91.4 03/12/2012 0640   MCH 29.2 03/12/2012 0640   MCHC 31.9 03/12/2012 0640   RDW 14.3 03/12/2012 0640   LYMPHSABS 1.9 03/09/2012 0846   MONOABS 0.8 03/09/2012 0846     EOSABS 0.2 03/09/2012 0846   BASOSABS 0.1 03/09/2012 0846    BMET    Component Value Date/Time   NA 142 03/12/2012 0640   K 4.1 03/12/2012 0640   CL 109 03/12/2012 0640   CO2 27 03/12/2012 0640   GLUCOSE 81 03/12/2012 0640   BUN 21 03/12/2012 0640   CREATININE 1.11* 03/12/2012 0640   CREATININE 1.09 08/29/2011 1001   CALCIUM 9.4 03/12/2012 0640   GFRNONAA 48* 03/12/2012 0640   GFRAA 56* 03/12/2012 0640    CMP     Component Value Date/Time   NA 142 03/12/2012 0640   K 4.1 03/12/2012 0640   CL 109 03/12/2012 0640   CO2 27 03/12/2012 0640   GLUCOSE 81 03/12/2012 0640   BUN 21 03/12/2012 0640   CREATININE 1.11* 03/12/2012 0640   CREATININE 1.09 08/29/2011 1001   CALCIUM 9.4 03/12/2012 0640   PROT 5.9* 03/08/2012 0121   ALBUMIN 2.6* 03/12/2012 0640   AST 21 03/08/2012 0121   ALT 26 03/08/2012 0121   ALKPHOS 102 03/08/2012 0121   BILITOT 0.3 03/08/2012 0121   GFRNONAA 48* 03/12/2012 0640   GFRAA 56* 03/12/2012 0640        Assessment and  Plan: 1. Code Status: DNr/DNI 2. Psycho/Social:Emotional support offered to patient and son at bedside, they voice their frustration in not "getting to the root" of the problem.  Patient just wants "to feel better".   3. Spiritual [employer] support 4. Disposition: When stable plan will be to dc home    Time In Time Out Total Time Spent with Patient Total Overall Time  0900 0945 45 min 45 min    Greater than 50%  of this time was spent counseling and coordinating care related to the above assessment and plan.  Dr Casper Harrison and Dr Paulina Fusi aware of above  Lorinda Creed NP  [phone]   1

## 2012-03-12 NOTE — Progress Notes (Signed)
FMTS Attending Daily Note: Beth Levy MD [phone] pager office [phone] I was called by Ms Gailen Shelter Np of palliative care at 16:00 with concerns that the patients epigastric pain was worse or somehow different and that she appreciated a mass in the patient's abdomen. We had scheduled patient for discharge. I went to see [name]. Her son was at bedside. I examined [name]--she was asymptomatic at the time. Her abdomen was benign. While I was visiting with her, she complained of again feeling a 'knot' in her stomach. I did a repeat exam. She is fairly thin and I suspect what she is feeling is peristalsis. Her bowel sounds were active normally so. She has not had a bowel movement since yesterday. I reassured her and her son that we would be willing to keep her overnight, watch for bowel movement. If no bleeding overnight, then we will discharge home. I personally discussed with her the same issues Dr Sheffield Slider had discussed--I reviewed the fact that she likely WILL have another GI bleed---it could be anytime.  Now or months away. Both she and her son expressed understanding and agree with this plan.  I spoke with NP Larach and the social worker on my way out of the room and informed them of same.

## 2012-03-12 NOTE — Progress Notes (Signed)
Daily Progress Note Family Medicine Teaching Service Service Pager: [phone]   Subjective: Pt did well overnight.  No BM yesterday.  Some occasional abdominal cramps, but nothing too severe.  Eating is going well.    Objective:  VITALS Temp:  [97.6 F (36.4 C)-98.7 F (37.1 C)] 97.6 F (36.4 C) (10/28 0939) Pulse Rate:  [66-83] 75  (10/28 0939) Resp:  [18] 18  (10/28 0939) BP: (125-141)/(62-81) 141/68 mmHg (10/28 0939) SpO2:  [90 %-100 %] 100 % (10/28 0939) Weight:  [122 lb 2.2 oz (55.4 kg)] 122 lb 2.2 oz (55.4 kg) (10/27 2037)  In/Out  Intake/Output Summary (Last 24 hours) at 03/12/12 0952 Last data filed at 03/12/12 0900  Gross per 24 hour  Intake 1982.5 ml  Output      0 ml  Net 1982.5 ml    Physical Exam: General appearance: NAD, sleeping comfortably prior to exam Lungs: clear to auscultation bilaterally  Heart: RRR, S1, S2 normal, no murmur, click, rub or gallop  Abdomen: soft, non-tender; bowel sounds normal; no masses, no organomegaly  Extremities: extremities normal, no edema  MEDS I have reviewed her medications.  Labs and imaging:   CBC  Lab 03/12/12 0640 03/11/12 0655 03/10/12 1546 03/09/12 0846  WBC 8.7 7.6 -- 7.6  HGB 9.2* 8.3* 8.9* --  HCT 28.8* 25.6* 26.7* --  PLT 454* 360 -- 291   BMET/CMET  Lab 03/12/12 0640 03/08/12 0121 03/06/12 0256  NA 142 142 140  K 4.1 4.0 4.3  CL 109 106 111  CO2 27 27 24   BUN 21 19 16   CREATININE 1.11* 1.22* 1.19*  CALCIUM 9.4 9.3 8.3*  PROT -- 5.9* --  BILITOT -- 0.3 --  ALKPHOS -- 102 --  ALT -- 26 --  AST -- 21 --  GLUCOSE 81 92 99    Assessment  71 year old female with PMHx of Diverticulitis s/p hemicolectomy who presents with bright red blood per rectum:   1) GI Bleed, lower - Pt recently d/c with a LGIB and had two episodes since returning home.   1)Hgb stable this morning at 9.2.  GI came by to speak with the patient yesterday and recommended no further intervention from their perspective.  They  would like to watch for today and if no bleeding send home.  If does have re-bleeding, will need surgery to re-evaluate for total colectomy, as pt does realize that this may be the only option to stop the bleeding permanently.      2) Monitor vitals per unit.   3) Continue holding ASA, Zofran for nausea, Pepcid for GI PPx as well as Protonix 40 mg qd.   4) Continue to follow palliative's recommendations.  Also with have pt f/u with GI in the outpatient setting, Dr. Dulce Sellar with [facility].    2) Hypertension - Stable, continue home meds.   3) Hypothyroidism- continue home synthroid. Will recheck TSH as last one was in April of 2013.  Also will check free T4.   4) Possible Depression - Geriatric depression scale negative for depression.  5) Severe Protein Calorie Malnutrition   1) Appreciate nutrition recommendations.  Albumin decreased to 2.6  2) Continue Remeron 7.5 mg qhs for appetite.  3) Mg level 1.8 on check this AM.   5) FEN- 75 cc/hr NS + 20 meq KCL 7) DVT PPX- SCD's (no anticoagulation due to bleeding)  8) Disposition- Anticipate either d/c home or surgery consult today.  Code Status: DNR   Gildardo Cranker  03/12/2012, 9:52 AM

## 2012-03-12 NOTE — Discharge Summary (Signed)
I interviewed and examined this patient and discussed the care plan with Dr. Paulina Fusi and the [facility] team and agree with assessment and plan as documented in the progress note for today.    Chrisa Hassan A. Sheffield Slider, MD Family Medicine Teaching Service Attending  03/12/2012 3:08 PM

## 2012-03-12 NOTE — Progress Notes (Signed)
Nutrition Follow-up  Intervention:  Continue current nutrition interventions.  Assessment:   Intake of meals is improved, pt is eating 75 - 100%. Pt will take supplement when offered, most of the time, per administration records.  Potassium, magnesium, and phosphorus are all WNL.  Diet Order:  Parke Simmers diet Supplements: Resource Breeze PO TID  Meds: Scheduled Meds:   . calcium-vitamin D  1 tablet Oral BID WC  . famotidine  20 mg Oral BID  . feeding supplement  1 Container Oral TID BM  . hydrochlorothiazide  25 mg Oral Daily  . levothyroxine  75 mcg Oral Daily  . lisinopril  40 mg Oral Daily  . mirtazapine  7.5 mg Oral QHS  . pantoprazole  40 mg Oral Daily  . simvastatin  40 mg Oral q1800   Continuous Infusions:   . DISCONTD: 0.9 % NaCl with KCl 20 mEq / L 75 mL/hr at 03/12/12 0622   PRN Meds:.acetaminophen  Labs:  CMP     Component Value Date/Time   NA 142 03/12/2012 0640   K 4.1 03/12/2012 0640   CL 109 03/12/2012 0640   CO2 27 03/12/2012 0640   GLUCOSE 81 03/12/2012 0640   BUN 21 03/12/2012 0640   CREATININE 1.11* 03/12/2012 0640   CREATININE 1.09 08/29/2011 1001   CALCIUM 9.4 03/12/2012 0640   PROT 5.9* 03/08/2012 0121   ALBUMIN 2.6* 03/12/2012 0640   AST 21 03/08/2012 0121   ALT 26 03/08/2012 0121   ALKPHOS 102 03/08/2012 0121   BILITOT 0.3 03/08/2012 0121   GFRNONAA 48* 03/12/2012 0640   GFRAA 56* 03/12/2012 0640   Phosphorus  Date/Time Value Range Status  03/12/2012  6:40 AM 3.1  2.3 - 4.6 mg/dL Final  16/02/9603  5:40 AM 2.4  2.3 - 4.6 mg/dL Final   Magnesium  Date/Time Value Range Status  03/12/2012  6:40 AM 1.8  1.5 - 2.5 mg/dL Final  98/03/9146  8:29 AM 1.8  1.5 - 2.5 mg/dL Final   Potassium  Date/Time Value Range Status  03/12/2012  6:40 AM 4.1  3.5 - 5.1 mEq/L Final  03/08/2012  1:21 AM 4.0  3.5 - 5.1 mEq/L Final  03/06/2012  2:56 AM 4.3  3.5 - 5.1 mEq/L Final     Intake/Output Summary (Last 24 hours) at 03/12/12 1455 Last data filed at  03/12/12 1300  Gross per 24 hour  Intake 2702.5 ml  Output      0 ml  Net 2702.5 ml  BM 10/27  Weight Status:  122 lb - wt up 6 lb x 2 days; likely elevated 2/2 positive fluid status  Body mass index is 23.08 kg/(m^2). Weight is WNL.  Estimated needs:  1350 - 1600 kcal, 65 - 80 grams protein  Nutrition Dx:  Predicted suboptimal energy intake r/t chronic poor appetite PTA AEB pt and family report.  Goal:  Resolution of diarrhea and pt to consume >90% of meals/supplements.  Monitor:  Weights, labs, PO intake, I/O's  Jarold Motto MS, RD, LDN Pager: [phone] After-hours pager: [phone]

## 2012-03-12 NOTE — Progress Notes (Signed)
FMTS Attending Daily Note: Denny Levy MD [phone] pager office [phone] I  have  reviewed this patient's chart. chart. I have discussed this patient with the resident and with Dr Sheffield Slider who examined her and discussed with me.. I agree with Dr. Martin Majestic and the resident's findings, assessment and care plan.  She has no sign of continued bleeding, seems hemodynamically stable. It is anybody's best guess if / when she will bleed again or how severe. I appreciate Dr Marge Duncans notes and I agree---should she have recurrent bleeding we should revisit surgical intervention. Until then, I think she could go home with as much chance of bleeding again tonight as next week or next month. This is a very tenuous position for her but I think she understands it. I understand her desire to avoid full colectomy with resulting permanent colostomy and I think that is a reasonable approach. If she is stable for a while, then she will have avoided a major surgical intervention. If she has continued or re-bleed, then I think she will be pushed to reconsider colectomy.

## 2012-03-12 NOTE — Progress Notes (Signed)
03/12/2012  Around 0850 Patient stated  her left eye she was seeing black spot bouncing around, both patient and son said it happen years ago, but stop. Patient stated while she been in the hospital it happen yesterday and the day before. She also complain of having stomach cramps, she stated she feel something round. Patient stated the cramp started yesterday. Md was notified and is aware. Biochemist, clinical.

## 2012-03-13 ENCOUNTER — Inpatient Hospital Stay (HOSPITAL_COMMUNITY): Payer: Medicare Other

## 2012-03-13 ENCOUNTER — Inpatient Hospital Stay: Payer: Medicare Other | Admitting: Family Medicine

## 2012-03-13 LAB — HEMOGLOBIN AND HEMATOCRIT, BLOOD: Hemoglobin: 8.5 g/dL — ABNORMAL LOW (ref 12.0–15.0)

## 2012-03-13 MED ORDER — POLYETHYLENE GLYCOL 3350 17 G PO PACK
17.0000 g | PACK | Freq: Every day | ORAL | Status: DC
  Administered 2012-03-13 – 2012-03-14 (×2): 17 g via ORAL
  Filled 2012-03-13 (×3): qty 1

## 2012-03-13 MED ORDER — GLYCERIN (LAXATIVE) 2.1 G RE SUPP
1.0000 | Freq: Once | RECTAL | Status: AC
  Administered 2012-03-13: 1 via RECTAL
  Filled 2012-03-13 (×2): qty 1

## 2012-03-13 MED ORDER — HYDROCODONE-ACETAMINOPHEN 5-325 MG PO TABS
1.0000 | ORAL_TABLET | ORAL | Status: DC | PRN
  Administered 2012-03-13 – 2012-03-14 (×3): 1 via ORAL
  Filled 2012-03-13 (×3): qty 1

## 2012-03-13 MED ORDER — IOHEXOL 300 MG/ML  SOLN: 20.0000 mL | INTRAMUSCULAR | Status: AC

## 2012-03-13 MED ORDER — MORPHINE SULFATE 2 MG/ML IJ SOLN
2.0000 mg | INTRAMUSCULAR | Status: DC | PRN
  Administered 2012-03-13: 2 mg via INTRAVENOUS
  Filled 2012-03-13: qty 1

## 2012-03-13 MED ORDER — IOHEXOL 300 MG/ML  SOLN
80.0000 mL | Freq: Once | INTRAMUSCULAR | Status: AC | PRN
  Administered 2012-03-13: 80 mL via INTRAVENOUS

## 2012-03-13 NOTE — Progress Notes (Signed)
FMTS Attending Daily Note: Beth Levy MD [phone] pager office [phone] I  have seen and examined this patient, reviewed their chart. I have discussed this patient with the resident. I agree with the resident's findings, assessment and care plan. Had planned discharge today. Patient had only small bowel movement but no bleeding. This afternoon she complained of excruciating B low back pain. Was having difficulty sitting up without a lot of assistance. Will do CT scan to rule out some type of retroperitoneal bleed and recheck stat hgb. I have seen her now two days in a row and I am beginning to wonder if she has some dementia as she never seems to remember the conversations we have had---even if only a few minutes have passed. Two of her sons present at bedside and have discussed with  Them as well.

## 2012-03-13 NOTE — Progress Notes (Signed)
Progress Note from the Palliative Medicine Team at [facility]  Subjective: patient in bed son at bedside, patient c/o severe right sided pain and continued abdominal pain, scaled at a 10/10  Nursing notified and tramadol administers     Objective: No Known Allergies Scheduled Meds:   . calcium-vitamin D  1 tablet Oral BID WC  . famotidine  20 mg Oral BID  . feeding supplement  1 Container Oral TID BM  . Glycerin (Adult)  1 suppository Rectal Once  . Glycerin (Adult)  1 suppository Rectal Once  . hydrochlorothiazide  25 mg Oral Daily  . levothyroxine  75 mcg Oral Daily  . lisinopril  40 mg Oral Daily  . mirtazapine  7.5 mg Oral QHS  . pantoprazole  40 mg Oral Daily  . polyethylene glycol  17 g Oral Daily  . simvastatin  40 mg Oral q1800   Continuous Infusions:  PRN Meds:.acetaminophen, traMADol, DISCONTD:  morphine injection  BP 96/64  Pulse 81  Temp 99 F (37.2 C) (Oral)  Resp 18  Ht 5\' 1"  (1.549 m)  Wt 52.753 kg (116 lb 4.8 oz)  BMI 21.97 kg/m2  SpO2 100%   PPS: 40 %  Pain Score:10/10 right side and flank area, epigastric area  Intake/Output Summary (Last 24 hours) at 03/13/12 1633 Last data filed at 03/13/12 1345  Gross per 24 hour  Intake    920 ml  Output    500 ml  Net    420 ml       Physical Exam:  General:   Ill appearing, generally uncomfortable HEENT:  + temporal and facial muscle wasting Chest:   CTA CVS: RRR Abdomen: generalized  pain on palpation +BS Ext: without edema Neuro:alert and oriented X3  Labs: CBC    Component Value Date/Time   WBC 8.7 03/12/2012 0640   RBC 3.15* 03/12/2012 0640   HGB 9.2* 03/12/2012 0640   HCT 28.8* 03/12/2012 0640   PLT 454* 03/12/2012 0640   MCV 91.4 03/12/2012 0640   MCH 29.2 03/12/2012 0640   MCHC 31.9 03/12/2012 0640   RDW 14.3 03/12/2012 0640   LYMPHSABS 1.9 03/09/2012 0846   MONOABS 0.8 03/09/2012 0846   EOSABS 0.2 03/09/2012 0846   BASOSABS 0.1 03/09/2012 0846    BMET    Component Value  Date/Time   NA 142 03/12/2012 0640   K 4.1 03/12/2012 0640   CL 109 03/12/2012 0640   CO2 27 03/12/2012 0640   GLUCOSE 81 03/12/2012 0640   BUN 21 03/12/2012 0640   CREATININE 1.11* 03/12/2012 0640   CREATININE 1.09 08/29/2011 1001   CALCIUM 9.4 03/12/2012 0640   GFRNONAA 48* 03/12/2012 0640   GFRAA 56* 03/12/2012 0640    CMP     Component Value Date/Time   NA 142 03/12/2012 0640   K 4.1 03/12/2012 0640   CL 109 03/12/2012 0640   CO2 27 03/12/2012 0640   GLUCOSE 81 03/12/2012 0640   BUN 21 03/12/2012 0640   CREATININE 1.11* 03/12/2012 0640   CREATININE 1.09 08/29/2011 1001   CALCIUM 9.4 03/12/2012 0640   PROT 5.9* 03/08/2012 0121   ALBUMIN 2.6* 03/12/2012 0640   AST 21 03/08/2012 0121   ALT 26 03/08/2012 0121   ALKPHOS 102 03/08/2012 0121   BILITOT 0.3 03/08/2012 0121   GFRNONAA 48* 03/12/2012 0640   GFRAA 56* 03/12/2012 0640    Assessment and Plan: 1. Code Status: DNR/DNI 2. Symptom Control: Ultram as previously ordered, discussed with patient alternative medications  3. Psycho/Social: Emotional support offered to patient and son at bedside, "scared and frustrated" with her uncertain medical situation 4. Disposition: Dependant on outcomes   Time In Time Out Total Time Spent with Patient Total Overall Time  1215 1250 25 min 25 min    Greater than 50%  of this time was spent counseling and coordinating care related to the above assessment and plan.  Lorinda Creed NP [phone]   1

## 2012-03-13 NOTE — Progress Notes (Signed)
Daily Progress Note Family Medicine Teaching Service Service Pager: [phone]   Subjective: Pt did well overnight.  Continues to not have a BM.  New onset Right flank pain worse with movement.  No fevers or chills.    Objective:  VITALS Temp:  [97.6 F (36.4 C)-98.5 F (36.9 C)] 98.3 F (36.8 C) (10/29 4540) Pulse Rate:  [75-85] 85  (10/29 0642) Resp:  [18] 18  (10/29 0642) BP: (104-154)/(54-86) 104/54 mmHg (10/29 0642) SpO2:  [100 %] 100 % (10/29 0642) Weight:  [116 lb 4.8 oz (52.753 kg)] 116 lb 4.8 oz (52.753 kg) (10/28 2040)  In/Out  Intake/Output Summary (Last 24 hours) at 03/13/12 9811 Last data filed at 03/12/12 2043  Gross per 24 hour  Intake    720 ml  Output    300 ml  Net    420 ml    Physical Exam: General appearance: NAD, sleeping comfortably prior to exam Lungs: clear to auscultation bilaterally  Heart: RRR, S1, S2 normal, no murmur, click, rub or gallop  Abdomen: soft, TTP in flank on the right but otherwise no TTP; bowel sounds normal; no masses, no organomegaly  Extremities: extremities normal, no edema Back: TTP around attachment of iliolumbar muscle to the 12th rib.  Worse with flexion and rotation to the right.   MEDS I have reviewed her medications.  Labs and imaging:   CBC  Lab 03/12/12 0640 03/11/12 0655 03/10/12 1546 03/09/12 0846  WBC 8.7 7.6 -- 7.6  HGB 9.2* 8.3* 8.9* --  HCT 28.8* 25.6* 26.7* --  PLT 454* 360 -- 291   BMET/CMET  Lab 03/12/12 0640 03/08/12 0121  NA 142 142  K 4.1 4.0  CL 109 106  CO2 27 27  BUN 21 19  CREATININE 1.11* 1.22*  CALCIUM 9.4 9.3  PROT -- 5.9*  BILITOT -- 0.3  ALKPHOS -- 102  ALT -- 26  AST -- 21  GLUCOSE 81 92    Assessment  71 year old female with PMHx of Diverticulitis s/p hemicolectomy who presents with bright red blood per rectum:   1) GI Bleed, lower - Pt recently d/c with a LGIB and had two episodes since returning home.   1)Hgb stable at 9.2.  No bleeding at this point.  No BM.  Will  give glycerin suppository again this AM (once PM), and also give her a dose of miralax.  Would like to see a BM without blood before d/c.  F/U with GI in outpatient setting.   2) Monitor vitals per unit.   3) Continue holding ASA, Zofran for nausea, Pepcid for GI PPx as well as Protonix 40 mg qd.   4) Continue to follow palliative's recommendations.     2) Hypertension - Stable, continue home meds.   3) Hypothyroidism- continue home synthroid. TSH and T4 WNL.   4) Lower Flank Pain on the Right - Most likely muscle spasm.  No CVA tenderness, no fever, chills, blood in urine, or dysuria.    1) Worse with movement, especially forward flexion.  TTP around ililumbar muscle on the right at the attachment to the 12th rib.   2) Ultram PRN.  Was given one dose of morphine last night.  Due to her lack of BM, will d/c this.    5) Severe Protein Calorie Malnutrition   1) Appreciate nutrition recommendations.  Albumin decreased to 2.6  2) Continue Remeron 7.5 mg qhs for appetite.  3) Mg level 1.8 on check this AM.   5)  FEN- SLIV.   7) DVT PPX- SCD's (no anticoagulation due to bleeding)  8) Disposition- Anticipate either d/c home or surgery consult today.  Code Status: DNR   Gildardo Cranker 03/13/2012, 9:22 AM

## 2012-03-14 DIAGNOSIS — R1013 Epigastric pain: Secondary | ICD-10-CM

## 2012-03-14 LAB — CBC
Hemoglobin: 8.7 g/dL — ABNORMAL LOW (ref 12.0–15.0)
MCH: 31 pg (ref 26.0–34.0)
MCHC: 33.6 g/dL (ref 30.0–36.0)
MCV: 92.2 fL (ref 78.0–100.0)
RBC: 2.81 MIL/uL — ABNORMAL LOW (ref 3.87–5.11)

## 2012-03-14 MED ORDER — POLYETHYLENE GLYCOL 3350 17 G PO PACK: 17.0000 g | PACK | Freq: Every day | ORAL | Status: DC

## 2012-03-14 NOTE — Discharge Summary (Signed)
Physician Discharge Summary  Patient ID: Beth Harper MRN: [id number] DOB: [birth year] Age: 71 y.o.  Admit date: 03/08/2012 Discharge date: 03/14/2012 Admitting Physician: Barbaraann Barthel, MD  PCP: Dessa Phi, MD  Consultants:GI     Discharge Diagnosis: Principal Problem:  *GI bleed Active Problems:  HYPOTHYROIDISM, UNSPECIFIED  HYPERTENSION, BENIGN SYSTEMIC  Acute blood loss anemia  Weakness generalized  Abdominal pain, epigastric    Hospital Course 71 year old female with PMHx of Diverticulitis s/p hemicolectomy who presents with two episodes of clotting passed with stool.  She was recently hospitalized for this same issue, please refer to previous d/c for her hospital course.   1) Lower GI Bleed - Pt presented to the ED with reported two episodes of clots in her stool after being discharged from the hospital from a lengthy stay.  Her Hgb on admission was 8.9 (previous 4 Hgb before leaving were between 8.1-8.7)  A hemocult in the ED was positive as well and she was given one bolus of 500 cc NS.  She was started on Protonix 40 mg qd, zofran for nausea, and her aspirin was held.  Pt was transferred to the floor where she did well, her vitals were stable, and she did not report further episodes of passing clot in her stool.  Her Hgb and Hct were monitored daily and she did have one episode where she decreased to 6.8 with an increase in her heart rate.  She was tranfused one U of pRBC at this point and her Hgb increased and remained stable.  At the time of d/c it was 9.2.  Also, GI came by to evaluate the patient again.  They felt there was nothing else from their perspective that they could offer.  They recommended that she follow up with Dr. Dulce Sellar in the outpatient setting and if she continues to have bleeding that the option to have a total colectomy would be the only way to prevent further bleeding with her stool.    2) Hypertension - Stable, continued home meds.   3)  Hypothyroidism - Stable, continued home synthroid.  4) Severe Protein Caloric Malnutrition - Pt had nutrition consult in hospital.  They recommended starting her to take Resource Breeze PO TID.    5) Possible Depression - Palliative Medicine was consulted as pt was having depressive symptoms including decrease energy, weight loss, trouble with sleep.  She was started on Remeron 7.5 mg qhs. Stable at d/c.   6) Right Flank Pain/Epigastric Pain - Pt started to c/o right flank pain along with epigastric pain while in the hospital.  On exam, she had TTP along the right flank and epigastric region.  Worse with movement but no fevers, chills, sweats, bone pain.  She was given tramadol and morphine which helped control her pain and a CT abdomen was ordered, which showed a large atonic loop of small bowel with residual stool in the colon.  Surgery was consulted to evaluate the CT and they felt there was nothing concerning and she could be sent home without f/u.  At time of d/c, she was doing well.           Discharge PE   Filed Vitals:   03/14/12 1330  BP: 120/63  Pulse: 77  Temp: 97.4 F (36.3 C)  Resp: 18   General: NAD, WN/WD, pleasant female  HEENT: [place]/AT, PERRLA, EOMI B/L, mucous membranes slightly dry  Cardiovascular: RRR, + 1/6 SEM LUSB  Respiratory: CTA B/L  Abdomen: NABS, Nontender, nondistended, soft, +  incision midline from previous hemicolectomy  Extremities: Pulses + 2/4, slight pallor  Skin: No rashes     Procedures/Imaging:  Nm Gi Blood Loss  03/01/2012   IMPRESSION: Evidence of active bleeding centered in the right side of the abdomen.  Favored to be within the ascending colon with minimal retrograde movement into the cecum and antegrade movement into the transverse colon.  A distal small bowel source could look similar.  These results will be called to the ordering clinician or representative by the Radiologist Assistant, and communication documented in the PACS Dashboard.    Original Report Authenticated By: Consuello Bossier, M.D.     Ir Angiogram Visceral Selective  03/06/2012    IMPRESSION: A subtle focus of bleeding from the right colic artery was delineated during the first half the procedure.  During the exhausted efforts to achieve embolization, the contrast extravasation resolved.  Please note that anatomy is markedly distorted secondary to prior surgery.  There is also a common origin for the celiac and SMA.   Original Report Authenticated By: Donavan Burnet, M.D.      Labs  CBC  Lab 03/14/12 0530 03/13/12 1520 03/12/12 0640 03/11/12 0655  WBC 8.8 -- 8.7 7.6  HGB 8.7* 8.5* 9.2* --  HCT 25.9* 26.3* 28.8* --  PLT 432* -- 454* 360   BMET  Lab 03/12/12 0640 03/08/12 0121  NA 142 142  K 4.1 4.0  CL 109 106  CO2 27 27  BUN 21 19  CREATININE 1.11* 1.22*  CALCIUM 9.4 9.3  PROT -- 5.9*  BILITOT -- 0.3  ALKPHOS -- 102  ALT -- 26  AST -- 21  GLUCOSE 81 92     CT Abdomen Pelvis 03/14/12 IMPRESSION:  1. 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 This loop measures  up to 7.9 cm.  2. Moderate stool within the residual colon.  3. Moderate periportal edema.  4. Diffuse subcutaneous edematous changes.     Patient condition at time of discharge/disposition: stable  Disposition-home   Follow up issues: 1. Please see how patient is doing with regard to bleeding with her stool.  Consider repeating Hgb.  2. Please discuss with patient total colectomy to prevent further episodes of bleeding and presentation to the ED.   3. F/u Weight loss.  Started on Remeron 7.5 mg qhs  Discharge follow up:  Follow-up Information    Schedule an appointment as soon as possible for a visit with Dessa Phi, MD.   Contact information:   [address] [place] [address] [phone]       Schedule an appointment as soon as possible for a visit with Freddy Jaksch, MD.   Contact  information:   [address] [place] [address] [phone]         Discharge Orders    Future Orders Please Complete By Expires   Diet - low sodium heart healthy      Increase activity slowly          Discharge Instructions: Please refer to Patient Instructions section of EMR for full details.  Patient was counseled important signs and symptoms that should prompt return to medical care, changes in medications, dietary instructions, activity restrictions, and follow up appointments.  Significant instructions noted below:    Discharge Medications   Medication List     As of 03/14/2012  5:51 PM    START taking these medications  mirtazapine 7.5 MG tablet   Commonly known as: REMERON   Take 1 tablet (7.5 mg total) by mouth at bedtime.      polyethylene glycol packet   Commonly known as: MIRALAX / GLYCOLAX   Take 17 g by mouth daily.      CONTINUE taking these medications         acetaminophen 500 MG tablet   Commonly known as: TYLENOL      calcium-vitamin D 500-200 MG-UNIT per tablet   Take 1 tablet by mouth 2 (two) times daily with a meal.      famotidine 20 MG tablet   Commonly known as: PEPCID      hydrochlorothiazide 25 MG tablet   Commonly known as: HYDRODIURIL   Take 1 tablet (25 mg total) by mouth daily.      levothyroxine 75 MCG tablet   Commonly known as: SYNTHROID, LEVOTHROID   Take 1 tablet (75 mcg total) by mouth daily.      lisinopril 40 MG tablet   Commonly known as: PRINIVIL,ZESTRIL   Take 1 tablet (40 mg total) by mouth daily.      pravastatin 80 MG tablet   Commonly known as: PRAVACHOL   Take 1 tablet (80 mg total) by mouth daily.      STOP taking these medications         ranitidine 150 MG tablet   Commonly known as: ZANTAC          Where to get your medications    These are the prescriptions that you need to pick up. We sent them to a specific pharmacy, so you will need to go there to get them.    [facility] [address], [place] - [address]    [address]  [place] [address]    Phone: [phone]        mirtazapine 7.5 MG tablet   polyethylene glycol packet                Gildardo Cranker, DO of [facility] 03/14/2012 5:51 PM

## 2012-03-14 NOTE — Progress Notes (Signed)
Palliative Medicine Team SW Psychosocial follow up along with PMT NP. Met with pt and son and continue to provide empathic listening regarding difficulty of living with the uncertainty of pt's condition and family's understood to d/c soon. Discussed the role that anxiety has begun to plan in pt's eating and subsequent anticipation of digestive upset. Encouraged journaling pt's sxs at home and how to use mindfulness and positive thinking for emotional coping. Also gave guidance on navigating health systems and how to advocate for pt's needs with primary care. Both pt and son expressed great appreciation for PMT support throughout hospitalization. Will follow up, unless pt d/cs  Kennieth Francois, [facility] Pager [phone]

## 2012-03-14 NOTE — Progress Notes (Signed)
PGY 1 Update: Spoke with Megan, [facility], who took a look at the CT scan of the patient's abdomen.  No surgical indication at this point and believes pt does not need to follow up in the outpatient setting.  Twana First Paulina Fusi, DO of [facility] 03/14/2012, 3:49 PM

## 2012-03-14 NOTE — Progress Notes (Signed)
Daily Progress Note Family Medicine Teaching Service Service Pager: [phone]   Subjective: Pt did well overnight.  Had one BM yesterday without blood. B/L flank pain continues and went to have CT yesterday.     Objective:  VITALS Temp:  [97.7 F (36.5 C)-99.4 F (37.4 C)] 97.8 F (36.6 C) (10/30 0810) Pulse Rate:  [69-88] 69  (10/30 0810) Resp:  [18] 18  (10/30 0810) BP: (90-119)/(47-65) 105/61 mmHg (10/30 0810) SpO2:  [96 %-100 %] 99 % (10/30 0810) Weight:  [111 lb 8.8 oz (50.6 kg)] 111 lb 8.8 oz (50.6 kg) (10/29 2034)  In/Out  Intake/Output Summary (Last 24 hours) at 03/14/12 0949 Last data filed at 03/13/12 1345  Gross per 24 hour  Intake    480 ml  Output    200 ml  Net    280 ml    Physical Exam: General appearance: NAD,  Lungs: clear to auscultation bilaterally  Heart: RRR, S1, S2 normal, no murmur, click, rub or gallop  Abdomen: soft, TTP in flank on the right but otherwise no TTP; bowel sounds normal; no masses, no organomegaly  Extremities: extremities normal, no edema Back: TTP B/L paraspinal region  MEDS I have reviewed her medications.  Labs and imaging:   CBC  Lab 03/14/12 0530 03/13/12 1520 03/12/12 0640 03/11/12 0655  WBC 8.8 -- 8.7 7.6  HGB 8.7* 8.5* 9.2* --  HCT 25.9* 26.3* 28.8* --  PLT 432* -- 454* 360   BMET/CMET  Lab 03/12/12 0640 03/08/12 0121  NA 142 142  K 4.1 4.0  CL 109 106  CO2 27 27  BUN 21 19  CREATININE 1.11* 1.22*  CALCIUM 9.4 9.3  PROT -- 5.9*  BILITOT -- 0.3  ALKPHOS -- 102  ALT -- 26  AST -- 21  GLUCOSE 81 92   CT 03/13/12 abdomen with contrast IMPRESSION:  1. 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 This loop measures  up to 7.9 cm.  2. Moderate stool within the residual colon.  3. Moderate periportal edema.  4. Diffuse subcutaneous edematous changes.   Assessment  71 year old female with PMHx of Diverticulitis s/p hemicolectomy who  presents with bright red blood per rectum:   1) GI Bleed, lower - Pt recently d/c with a LGIB and had two episodes since returning home.   1)Hgb stable around 8.7 this AM.  Did have one BM yesterday without blood. Will need outpatient GI apt for f/u.   2) Monitor vitals per unit.   3) Continue holding ASA, Zofran for nausea, Pepcid for GI PPx as well as Protonix 40 mg qd.   4) Continue to follow palliative's recommendations.     2) Hypertension - Stable, continue home meds.   3) Hypothyroidism- continue home synthroid. TSH and T4 WNL.   4) Lower Flank Pain on the Right - Most likely muscle spasm.  No CVA tenderness, no fever, chills, blood in urine, or dysuria.    1) Worse with movement, especially forward flexion.  TTP around ililumbar muscle on the right at the attachment to the 12th rib.   2) Ultram PRN.  Was given one dose of morphine last night.  Due to her lack of BM, will d/c this.    3) CT scan of abdomen yesterday with contrast does not show acute changes.  Consistent with intestine ileus.    5) Severe Protein Calorie Malnutrition   1) Appreciate nutrition recommendations.  Albumin  decreased to 2.6  2) Continue Remeron 7.5 mg qhs for appetite.  3) Mg level 1.8 on check this AM.   6) Constipation - Pt required glycerin suppository x 2 along with miralax qd.  Will continue miralax as pt goes home.  5) FEN- SLIV.   7) DVT PPX- SCD's (no anticoagulation due to bleeding)  8) Disposition- Anticipate either d/c home today.   Code Status: DNR   Gildardo Cranker 03/14/2012, 8:41 AM

## 2012-03-14 NOTE — Progress Notes (Signed)
FMTS Attending Daily Note: Beth Levy MD [phone] pager office [phone] I  have seen and examined this patient, reviewed their chart. I have discussed this patient with the resident. I agree with the resident's findings, assessment and care plan. 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 I think this accounts for some of her epigastric and flank pain. Discussed with surgery (who had seen her earlier in her hospital stay and they think no surgical intervention needed. I think she remains very concerned she is going to go home and have either bleeding or discomfort; I have discussed this with her and her sons and think these are very realistic fears. Unfortunately I think she has only two options: 1) get colectomy or 2) return home with watchful waiting. I suppose she could also consider SNF but as of yet she has had no interest in that and I am not sure she would qualify for SNF. Dr. Durene Harper is going to discuss with family again this pm.

## 2012-03-14 NOTE — Progress Notes (Signed)
Dr. Wynelle Cleveland, MS IV, performed a clock drawing mental examination on this patient.  She did well with the test scoring a 2. Consider normal if 1 or 2.  Twana First Paulina Fusi, DO of [facility] 03/14/2012, 12:31 PM

## 2012-03-14 NOTE — Progress Notes (Signed)
Patient discharged home with son. Patient discharged with discharge instructions and information on prescriptions. Patient was stable upon discharge.

## 2012-03-15 ENCOUNTER — Telehealth: Payer: Self-pay | Admitting: Family Medicine

## 2012-03-15 NOTE — Progress Notes (Signed)
Briscoe Deutscher, DO Resident Cosign Needed Family Medicine D/C Summaries 03/14/2012 1:51 PM  Physician Discharge Summary    Patient ID: Beth Harper MRN: [id number] DOB: Apr 29, 1941 Age: 71 y.o.   Admit date: 03/08/2012 Discharge date: 03/14/2012 Admitting Physician: Barbaraann Barthel, MD   PCP: Dessa Phi, MD   Consultants:GI     Discharge Diagnosis: Principal Problem:  *GI bleed Active Problems:  HYPOTHYROIDISM, UNSPECIFIED  HYPERTENSION, BENIGN SYSTEMIC  Acute blood loss anemia  Weakness generalized  Abdominal pain, epigastric   Hospital Course 71 year old female with PMHx of Diverticulitis s/p hemicolectomy who presents with two episodes of clotting passed with stool.  She was recently hospitalized for this same issue, please refer to previous d/c for her hospital course.    1) Lower GI Bleed - Pt presented to the ED with reported two episodes of clots in her stool after being discharged from the hospital from a lengthy stay.  Her Hgb on admission was 8.9 (previous 4 Hgb before leaving were between 8.1-8.7)  A hemocult in the ED was positive as well and she was given one bolus of 500 cc NS.  She was started on Protonix 40 mg qd, zofran for nausea, and her aspirin was held.  Pt was transferred to the floor where she did well, her vitals were stable, and she did not report further episodes of passing clot in her stool.  Her Hgb and Hct were monitored daily and she did have one episode where she decreased to 6.8 with an increase in her heart rate. She was tranfused one U of pRBC at this point and her Hgb increased and remained stable.  At the time of d/c it was 9.2.  Also, GI came by to evaluate the patient again.  They felt there was nothing else from their perspective that they could offer.  They recommended that she follow up with Dr. Dulce Sellar in the outpatient setting and if she continues to have bleeding that the option to have a total colectomy would be the only way to prevent  further bleeding with her stool.     2) Hypertension - Stable, continued home meds.    3) Hypothyroidism - Stable, continued home synthroid.   4) Severe Protein Caloric Malnutrition - Pt had nutrition consult in hospital.  They recommended starting her to take Resource Breeze PO TID.     5) Possible Depression - Palliative Medicine was consulted as pt was having depressive symptoms including decrease energy, weight loss, trouble with sleep.  She was started on Remeron 7.5 mg qhs. Stable at d/c.    6) Right Flank Pain/Epigastric Pain - Pt started to c/o right flank pain along with epigastric pain while in the hospital.  On exam, she had TTP along the right flank and epigastric region.  Worse with movement but no fevers, chills, sweats, bone pain.  She was given tramadol and morphine which helped control her pain and a CT abdomen was ordered, which showed a large atonic loop of small bowel with residual stool in the colon.  Surgery was consulted to evaluate the CT and they felt there was nothing concerning and she could be sent home without f/u.  At time of d/c, she was doing well.              Discharge PE              Filed Vitals:     03/14/12 1330   BP:  120/63   Pulse:  77   Temp:  97.4 F (36.3 C)   Resp:  18    General: NAD, WN/WD, pleasant female   HEENT: [place]/AT, PERRLA, EOMI B/L, mucous membranes slightly dry   Cardiovascular: RRR, + 1/6 SEM LUSB   Respiratory: CTA B/L   Abdomen: NABS, Nontender, nondistended, soft, + incision midline from previous hemicolectomy   Extremities: Pulses + 2/4, slight pallor   Skin: No rashes       Procedures/Imaging:  Nm Gi Blood Loss   03/01/2012   IMPRESSION: Evidence of active bleeding centered in the right side of the abdomen.  Favored to be within the ascending colon with minimal retrograde movement into the cecum and antegrade movement into the transverse colon.  A distal small bowel source could look similar.  These results  will be called to the ordering clinician or representative by the Radiologist Assistant, and communication documented in the PACS Dashboard.   Original Report Authenticated By: Consuello Bossier, M.D.       Ir Angiogram Visceral Selective   03/06/2012    IMPRESSION: A subtle focus of bleeding from the right colic artery was delineated during the first half the procedure.  During the exhausted efforts to achieve embolization, the contrast extravasation resolved.  Please note that anatomy is markedly distorted secondary to prior surgery.  There is also a common origin for the celiac and SMA.   Original Report Authenticated By: Donavan Burnet, M.D.         Labs  CBC Lab  03/14/12 0530  03/13/12 1520  03/12/12 0640  03/11/12 0655   WBC  8.8  --  8.7  7.6   HGB  8.7*  8.5*  9.2*  --   HCT  25.9*  26.3*  28.8*  --   PLT  432*  --  454*  360    BMET Lab  03/12/12 0640  03/08/12 0121   NA  142  142   K  4.1  4.0   CL  109  106   CO2  27  27   BUN  21  19   CREATININE  1.11*  1.22*   CALCIUM  9.4  9.3   PROT  --  5.9*   BILITOT  --  0.3   ALKPHOS  --  102   ALT  --  26   AST  --  21   GLUCOSE  81  92       CT Abdomen Pelvis 03/14/12 IMPRESSION:   1. Large atonic loop of small bowel creates significant mass   effect and displaces the pancreas and duodenum. This loop measures   up to 7.9 cm.   2. Moderate stool within the residual colon.   3. Moderate periportal edema.   4. Diffuse subcutaneous edematous changes.       Patient condition at time of discharge/disposition: stable  Disposition-home   Follow up issues: 1. Please see how patient is doing with regard to bleeding with her stool.  Consider repeating Hgb.   2. Please discuss with patient total colectomy to prevent further episodes of bleeding and presentation to the ED.    3. F/u Weight loss.  Started on Remeron 7.5 mg qhs   Discharge follow up:  Follow-up Information      Schedule an appointment as soon as  possible for a visit with Dessa Phi, MD.     Contact information:     [address]  [place] [address] [phone]  Schedule an appointment as soon as possible for a visit with Freddy Jaksch, MD.     Contact information:     [address]  [address] [place] [address] [phone]             Discharge Orders      Future Orders  Please Complete By  Expires     Diet - low sodium heart healthy          Increase activity slowly                Discharge Instructions: Please refer to Patient Instructions section of EMR for full details.  Patient was counseled important signs and symptoms that should prompt return to medical care, changes in medications, dietary instructions, activity restrictions, and follow up appointments.  Significant instructions noted below:       Discharge Medications    Medication List        As of 03/14/2012  5:51 PM      START taking these medications              mirtazapine 7.5 MG tablet     Commonly known as: REMERON     Take 1 tablet (7.5 mg total) by mouth at bedtime.           polyethylene glycol packet     Commonly known as: MIRALAX / GLYCOLAX     Take 17 g by mouth daily.          CONTINUE taking these medications              acetaminophen 500 MG tablet     Commonly known as: TYLENOL           calcium-vitamin D 500-200 MG-UNIT per tablet     Take 1 tablet by mouth 2 (two) times daily with a meal.           famotidine 20 MG tablet     Commonly known as: PEPCID           hydrochlorothiazide 25 MG tablet     Commonly known as: HYDRODIURIL     Take 1 tablet (25 mg total) by mouth daily.           levothyroxine 75 MCG tablet     Commonly known as: SYNTHROID, LEVOTHROID     Take 1 tablet (75 mcg total) by mouth daily.           lisinopril 40 MG tablet     Commonly known as: PRINIVIL,ZESTRIL     Take 1 tablet (40 mg total) by mouth daily.           pravastatin 80 MG tablet      Commonly known as: PRAVACHOL     Take 1 tablet (80 mg total) by mouth daily.          STOP taking these medications              ranitidine 150 MG tablet     Commonly known as: ZANTAC                 Where to get your medications      These are the prescriptions that you need to pick up. We sent them to a specific pharmacy, so you will need to go there to get them.     [facility] - [place], [place] - [address]      [address]   [place] [address]    Phone: [phone]             mirtazapine 7.5 MG tablet     polyethylene glycol packet                            Gildardo Cranker, DO of [facility] 03/14/2012 5:51 PM         Revision History...     Date/Time User Action   03/14/2012 5:51 PM Briscoe Deutscher, DO Sign   03/14/2012 1:51 PM Briscoe Deutscher, DO Share  View Details Report

## 2012-03-15 NOTE — Telephone Encounter (Signed)
[facility] in [place] needs clarification on the Miralax dose and number of packets.  The patient really needs them because she was taking them in the hospital and they have worked for her and she is concerned that if she doesn't continue right away, she might end up back in the hospital.

## 2012-03-15 NOTE — Progress Notes (Signed)
Progress Note from the Palliative Medicine Team at [facility]  Subjective: patient alert and oriented, son at bedside  "I am still very uncomfortable"     Objective: No Known Allergies Scheduled Meds:   . DISCONTD: calcium-vitamin D  1 tablet Oral BID WC  . DISCONTD: famotidine  20 mg Oral BID  . DISCONTD: feeding supplement  1 Container Oral TID BM  . DISCONTD: hydrochlorothiazide  25 mg Oral Daily  . DISCONTD: levothyroxine  75 mcg Oral Daily  . DISCONTD: lisinopril  40 mg Oral Daily  . DISCONTD: mirtazapine  7.5 mg Oral QHS  . DISCONTD: pantoprazole  40 mg Oral Daily  . DISCONTD: polyethylene glycol  17 g Oral Daily  . DISCONTD: simvastatin  40 mg Oral q1800   Continuous Infusions:  PRN Meds:.DISCONTD: acetaminophen, DISCONTD: HYDROcodone-acetaminophen, DISCONTD: traMADol  BP 112/55  Pulse 84  Temp 98.1 F (36.7 C) (Oral)  Resp 18  Ht 5\' 1"  (1.549 m)  Wt 50.6 kg (111 lb 8.8 oz)  BMI 21.08 kg/m2  SpO2 97%   PPS: 50%  Pain Score: 7 / 10 Pain Location- mid abdomen and right flank   Intake/Output Summary (Last 24 hours) at 03/15/12 0711 Last data filed at 03/14/12 1330  Gross per 24 hour  Intake    220 ml  Output    150 ml  Net     70 ml      LBM: 03-14-12     Physical Exam:  General: NAD, weak and frail appearing HEENT:  + facial and temporal wasting  Chest:   diminished in bases  CTA CVS: RRR Abdomen: slightly distended, + epigastric tenderness, + BS Ext: without edema Neuro: alert and oriented  Labs: CBC    Component Value Date/Time   WBC 8.8 03/14/2012 0530   RBC 2.81* 03/14/2012 0530   HGB 8.7* 03/14/2012 0530   HCT 25.9* 03/14/2012 0530   PLT 432* 03/14/2012 0530   MCV 92.2 03/14/2012 0530   MCH 31.0 03/14/2012 0530   MCHC 33.6 03/14/2012 0530   RDW 14.2 03/14/2012 0530   LYMPHSABS 1.9 03/09/2012 0846   MONOABS 0.8 03/09/2012 0846   EOSABS 0.2 03/09/2012 0846   BASOSABS 0.1 03/09/2012 0846    BMET    Component Value Date/Time   NA  142 03/12/2012 0640   K 4.1 03/12/2012 0640   CL 109 03/12/2012 0640   CO2 27 03/12/2012 0640   GLUCOSE 81 03/12/2012 0640   BUN 21 03/12/2012 0640   CREATININE 1.11* 03/12/2012 0640   CREATININE 1.09 08/29/2011 1001   CALCIUM 9.4 03/12/2012 0640   GFRNONAA 48* 03/12/2012 0640   GFRAA 56* 03/12/2012 0640    CMP     Component Value Date/Time   NA 142 03/12/2012 0640   K 4.1 03/12/2012 0640   CL 109 03/12/2012 0640   CO2 27 03/12/2012 0640   GLUCOSE 81 03/12/2012 0640   BUN 21 03/12/2012 0640   CREATININE 1.11* 03/12/2012 0640   CREATININE 1.09 08/29/2011 1001   CALCIUM 9.4 03/12/2012 0640   PROT 5.9* 03/08/2012 0121   ALBUMIN 2.6* 03/12/2012 0640   AST 21 03/08/2012 0121   ALT 26 03/08/2012 0121   ALKPHOS 102 03/08/2012 0121   BILITOT 0.3 03/08/2012 0121   GFRNONAA 48* 03/12/2012 0640   GFRAA 56* 03/12/2012 0640     Assessment and Plan: 1. Code Status: DNR/DNI 2. Symptom Control: Education offered regarding use of medication for pain control 3. Psycho/Social: emotional support offered to patient  and son 4. Spiritual strong community church support 5. Disposition: To discharge home, education offered regarding anticipated needs-encouraged self advocacy/responsibility  as it relates to future healthcare issues     Time In Time Out Total Time Spent with Patient Total Overall Time  1100 1125 25 min 25 min    Greater than 50%  of this time was spent counseling and coordinating care related to the above assessment and plan.  Lorinda Creed NP   1

## 2012-03-15 NOTE — Telephone Encounter (Signed)
Returned call.  Left detailed message that Rx given was for 14 packets---take 17 grams daily.  Can call back if he has any questions or concerns.  Spoke with [facility]---they will fax request to change from packets to powder.  Will check with Dr. Armen Pickup tomorrow and call pharmacy back.  Gaylene Brooks, RN

## 2012-03-16 ENCOUNTER — Ambulatory Visit (INDEPENDENT_AMBULATORY_CARE_PROVIDER_SITE_OTHER): Payer: BC Managed Care – PPO | Admitting: Family Medicine

## 2012-03-16 ENCOUNTER — Encounter: Payer: Self-pay | Admitting: Family Medicine

## 2012-03-16 VITALS — BP 106/78 | HR 60 | Temp 98.6°F | Ht 61.0 in | Wt 114.0 lb

## 2012-03-16 DIAGNOSIS — R5381 Other malaise: Secondary | ICD-10-CM

## 2012-03-16 DIAGNOSIS — K922 Gastrointestinal hemorrhage, unspecified: Secondary | ICD-10-CM

## 2012-03-16 DIAGNOSIS — I10 Essential (primary) hypertension: Secondary | ICD-10-CM

## 2012-03-16 DIAGNOSIS — R109 Unspecified abdominal pain: Secondary | ICD-10-CM

## 2012-03-16 HISTORY — DX: Other malaise: R53.81

## 2012-03-16 MED ORDER — MINERAL OIL PO OIL: 15.0000 mL | TOPICAL_OIL | Freq: Every day | ORAL | Status: DC | PRN

## 2012-03-16 MED ORDER — POLYETHYLENE GLYCOL 3350 17 GM/SCOOP PO POWD: 17.0000 g | Freq: Every day | ORAL | Status: DC

## 2012-03-16 MED ORDER — SENNA 8.6 MG PO TABS: 1.0000 | ORAL_TABLET | Freq: Every day | ORAL | Status: DC | PRN

## 2012-03-16 NOTE — Progress Notes (Signed)
Beth Harper is a 71 y.o. female who presents to [facility] today for Hospital follow up  Pt recently discharged from the hospital after management of GI bleed requiring 1 unit of PRBC during admission. 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 Pt had been on several high dose narcotics for pain during admission. Pt reporting daily small bm that are soft, but has had large lump in belly since admission. Lump moves from time to time and is accompanied by abdominal discomfort. No alleviating factors but is aggravated by movement or palpation of the area. Pt has taken very little miralax at home w/o much benefit. Pt also now feeling weak and struggling to make it up the stairs at her sons house where she used to not have any difficulty. Pt had been fairly bed ridden for most of her recent hospitalizations. Pt reports normal appetite. And Denies Fever, n/v/d, CP, SOB, lightheadedness, syncope, hematochezia, hematemesis.   Abdominal films reviewed, and atonic portion of bowel noted.  The following portions of the patient's history were reviewed and updated as appropriate: allergies, current medications, past medical history, family and social history, and problem list.  Patient is a nonsmoker.  Past Medical History  Diagnosis Date  . Hypertension   . Diverticula, small intestine   . Hyperlipidemia   . Hypothyroidism     ROS as above otherwise neg. No Chest pain, palpitations, SOB, Fever, Chills, Abd pain, N/V/D.  Medications reviewed. Current Outpatient Prescriptions  Medication Sig Dispense Refill  . acetaminophen (TYLENOL) 500 MG tablet Take 500 mg by mouth every 6 (six) hours as needed. For pain      . Calcium Carbonate-Vitamin D (CALCIUM-VITAMIN D) 500-200 MG-UNIT per tablet Take 1 tablet by mouth 2 (two) times daily with a meal.  60 tablet  11  . famotidine (PEPCID) 20 MG tablet Take 20 mg by mouth 2 (two)  times daily.      . hydrochlorothiazide (HYDRODIURIL) 25 MG tablet Take 1 tablet (25 mg total) by mouth daily.  90 tablet  3  . levothyroxine (SYNTHROID, LEVOTHROID) 75 MCG tablet Take 1 tablet (75 mcg total) by mouth daily.  90 tablet  1  . lisinopril (PRINIVIL,ZESTRIL) 40 MG tablet Take 1 tablet (40 mg total) by mouth daily.  90 tablet  3  . mineral oil liquid Take 15 mLs by mouth daily as needed for constipation.  180 mL  12  . mirtazapine (REMERON) 7.5 MG tablet Take 1 tablet (7.5 mg total) by mouth at bedtime.  30 tablet  2  . polyethylene glycol powder (GLYCOLAX/MIRALAX) powder Take 17 g by mouth daily.  3350 g  1  . pravastatin (PRAVACHOL) 80 MG tablet Take 1 tablet (80 mg total) by mouth daily.  90 tablet  1  . senna (SENOKOT) 8.6 MG TABS Take 1-2 tablets (8.6-17.2 mg total) by mouth daily as needed.  120 each  0    Exam:  BP 106/78  Pulse 60  Temp 98.6 F (37 C) (Oral)  Ht 5\' 1"  (1.549 m)  Wt 114 lb (51.71 kg)  BMI 21.54 kg/m2 Gen: Well NAD HEENT: EOMI,  MMM Lungs: CTABL Nl WOB Heart: RRR no MRG Abd: Active bowel sounds. Large stool burden felt in abdomen that is freely moveable.  Exts: Non edematous BL  LE, warm and well perfused.   Results for orders placed during the hospital encounter of 03/08/12 (from the past 72 hour(s))  CBC  Status: Abnormal   Collection Time   03/14/12  5:30 AM      Component Value Range Comment   WBC 8.8  4.0 - 10.5 K/uL    RBC 2.81 (*) 3.87 - 5.11 MIL/uL    Hemoglobin 8.7 (*) 12.0 - 15.0 g/dL    HCT 47.8 (*) 29.5 - 46.0 %    MCV 92.2  78.0 - 100.0 fL    MCH 31.0  26.0 - 34.0 pg    MCHC 33.6  30.0 - 36.0 g/dL    RDW 62.1  30.8 - 65.7 %    Platelets 432 (*) 150 - 400 K/uL

## 2012-03-16 NOTE — Patient Instructions (Addendum)
You are doing very well You do not have any evidence of bleeding in your GI tract You still have a lot of stool in your bowel that needs to be cleaned out Please continue taking your Miralax to soften the stool every day as well as Senna to move the bowels every day Please only use the mineral oil if needed You may also consider using an enema if your discomfort continues Please come back in if you have anymore bleeding or become lightheaded  Constipation, Adult Constipation is when a person:  Poops (bowel movement) less than 3 times a week.  Has a hard time pooping.  Has poop that is dry, hard, or bigger than normal. HOME CARE   Eat more fiber, such as fruits, vegetables, whole grains like brown rice, and beans.  Eat less fatty foods and sugar. This includes [place] fries, hamburgers, cookies, candy, and soda.  If you are not getting enough fiber from food, take products with added fiber in them (supplements).  Drink enough fluid to keep your pee (urine) clear or pale yellow.  Go to the restroom when you feel like you need to poop. Do not hold it.  Only take medicine as told by your doctor. Do not take medicines that help you poop (laxatives) without talking to your doctor first.  Exercise on a regular basis, or as told by your doctor. GET HELP RIGHT AWAY IF:   You have bright red blood in your poop (stool).  Your constipation lasts more than 4 days or gets worse.  You have belly (abdomen) or butt (rectal) pain.  You have thin poop (as thin as a pencil).  You lose weight, and it cannot be explained. MAKE SURE YOU:   Understand these instructions.  Will watch your condition.  Will get help right away if you are not doing well or get worse. Document Released: 10/19/2007 Document Revised: 07/25/2011 Document Reviewed: 04/05/2011 [facility] Patient Information 2013 [place], [place].

## 2012-03-16 NOTE — Assessment & Plan Note (Signed)
Lengthy recent hospitalization has left pt physically deconditioned.  Pt typically very spry individual Pt to increase caloric intake and to slowly increase activity level as tolerated. No indication for PT or in home assistance at this time

## 2012-03-16 NOTE — Assessment & Plan Note (Addendum)
CUrrently no evidence of further bleed.  Pt to continue to monitor Pt to continue her pepcid

## 2012-03-16 NOTE — Telephone Encounter (Signed)
miralax powder sent in at greater quantity, see orders.

## 2012-03-16 NOTE — Assessment & Plan Note (Signed)
Pt w/ stool burden and unable to relieve this on her own Will represcribe Miralax as pt unable to fill previously and only taking what her son had left over WIll also Rx senna to assist w/ gut motility Small amount of mineral oil may also be used if other modalities fail Pt no longer on Opioids

## 2012-03-16 NOTE — Assessment & Plan Note (Signed)
Pt well controlled. No change to current therapy

## 2012-03-16 NOTE — Discharge Summary (Signed)
Family Medicine Teaching Service  Discharge Note : Attending Emmalena Canny MD Pager [phone] Office [phone] I have seen and examined this patient, reviewed their chart and discussed discharge planning wit the resident at the time of discharge. I agree with the discharge plan as above.  

## 2012-04-04 NOTE — Consult Note (Signed)
Agree with above 

## 2012-04-09 ENCOUNTER — Ambulatory Visit (INDEPENDENT_AMBULATORY_CARE_PROVIDER_SITE_OTHER): Payer: BC Managed Care – PPO | Admitting: Family Medicine

## 2012-04-09 ENCOUNTER — Encounter: Payer: Self-pay | Admitting: Family Medicine

## 2012-04-09 VITALS — BP 132/89 | HR 73 | Temp 98.7°F | Ht 61.0 in | Wt 117.0 lb

## 2012-04-09 DIAGNOSIS — F0391 Unspecified dementia with behavioral disturbance: Secondary | ICD-10-CM

## 2012-04-09 DIAGNOSIS — R5381 Other malaise: Secondary | ICD-10-CM

## 2012-04-09 DIAGNOSIS — N390 Urinary tract infection, site not specified: Secondary | ICD-10-CM

## 2012-04-09 DIAGNOSIS — R350 Frequency of micturition: Secondary | ICD-10-CM

## 2012-04-09 DIAGNOSIS — R39859 Costovertebral (angle) tenderness, unspecified side: Secondary | ICD-10-CM | POA: Insufficient documentation

## 2012-04-09 DIAGNOSIS — D62 Acute posthemorrhagic anemia: Secondary | ICD-10-CM

## 2012-04-09 DIAGNOSIS — R413 Other amnesia: Secondary | ICD-10-CM

## 2012-04-09 DIAGNOSIS — F03918 Unspecified dementia, unspecified severity, with other behavioral disturbance: Secondary | ICD-10-CM

## 2012-04-09 DIAGNOSIS — M549 Dorsalgia, unspecified: Secondary | ICD-10-CM

## 2012-04-09 DIAGNOSIS — I10 Essential (primary) hypertension: Secondary | ICD-10-CM

## 2012-04-09 HISTORY — DX: Unspecified dementia with behavioral disturbance: F03.91

## 2012-04-09 HISTORY — DX: Unspecified dementia, unspecified severity, with other behavioral disturbance: F03.918

## 2012-04-09 HISTORY — DX: Other amnesia: R41.3

## 2012-04-09 LAB — POCT UA - MICROSCOPIC ONLY: WBC, Ur, HPF, POC: >20

## 2012-04-09 LAB — POCT URINALYSIS DIPSTICK
Glucose, UA: NEGATIVE
Ketones, UA: NEGATIVE
Spec Grav, UA: 1.015
Urobilinogen, UA: 0.2

## 2012-04-09 LAB — CBC
HCT: 27.6 % — ABNORMAL LOW (ref 36.0–46.0)
MCHC: 33.3 g/dL (ref 30.0–36.0)
RDW: 14.7 % (ref 11.5–15.5)

## 2012-04-09 MED ORDER — CEPHALEXIN 500 MG PO CAPS: 500.0000 mg | ORAL_CAPSULE | Freq: Four times a day (QID) | ORAL | Status: AC

## 2012-04-09 NOTE — Patient Instructions (Addendum)
Mrs. Pepple,  Thank you for coming in today.   For dizziness: stop HCTZ.  For back pain:  UA suggestive of UTI with early pyelonephritis (kidney infection) causing back pain.  Will treat with keflex every 6 hours for 5 days. OK to take tylnoel for back pain.  F/u for fever, nausea, worsening pain.   F/u next month to address possible dementia and review labs.   Dr. Armen Pickup

## 2012-04-10 LAB — TSH: TSH: 0.157 u[IU]/mL — ABNORMAL LOW (ref 0.350–4.500)

## 2012-04-10 LAB — URINE CULTURE: Organism ID, Bacteria: NO GROWTH

## 2012-04-15 NOTE — Assessment & Plan Note (Signed)
A: suspect UTI based on symptoms and UA.  P: keflex and send urine for culture.

## 2012-04-15 NOTE — Assessment & Plan Note (Signed)
Check B12, folate, CBC and TSH. Patient to return for mini mental status exam.

## 2012-04-15 NOTE — Assessment & Plan Note (Signed)
A: well controlled for age. Concern for orthostatic symptoms and frequency on diuretic. P: d/c HCTZ. F/u in one month.

## 2012-04-15 NOTE — Assessment & Plan Note (Signed)
A: decline from baseline level of function. Stable weight. P: PT ordered. Request for PT in [place].

## 2012-04-15 NOTE — Progress Notes (Signed)
Subjective:     Patient ID: Beth Harper, female   DOB: February 03, 1941, 71 y.o.   MRN: [id number]  HPI 71 yo F presents for f/u visit accompanied by he sons to discuss multiple concerns:  1. Bilateral flank pain: x 3 days. No fever, nausea, vomiting. Patient does have urinary frequency and urgency. She denies dysuria.   2. Deconditioned: patient not driving and has moved in with her youngest son in [place] since hospitalization for GI bleed. She has also retired from work. She feels lightheaded upon standing. She still has the responsibility or raising her granddaughter who ~12. She denies recurrent GI bleed.   3. ? Dementia:  Son concerned that patient is slow to answer questions and seems to forget recent details. She recalls remote memories and people. She is able to perform her ADLs.  She has a history of hyperthyroidism for which she takes synthroid.   Review of Systems As per HPI     Objective:   Physical Exam BP 132/89  Pulse 73  Temp 98.7 F (37.1 C) (Oral)  Ht 5\' 1"  (1.549 m)  Wt 117 lb (53.071 kg)  BMI 22.11 kg/m2 Wt Readings from Last 3 Encounters:  04/09/12 117 lb (53.071 kg)  03/16/12 114 lb (51.71 kg)  03/13/12 111 lb 8.8 oz (50.6 kg)   General appearance: cooperative, thin, elderly female, appears stated age.  Neck: no adenopathy, no carotid bruit, no JVD, supple, symmetrical, trachea midline and thyroid not enlarged, symmetric, no tenderness/mass/nodules Back: symmetric bilateral CVA tenderness  Lungs: clear to auscultation bilaterally Heart: regular rate and rhythm, S1, S2 normal, no murmur, click, rub or gallop Neurologic: Grossly normal    Assessment and Plan:

## 2012-04-16 ENCOUNTER — Telehealth: Payer: Self-pay | Admitting: Family Medicine

## 2012-04-16 NOTE — Telephone Encounter (Signed)
Is asking to be referred to [facility] instead of [facility] - would like [place] to call [phone] for the referral

## 2012-04-17 NOTE — Telephone Encounter (Signed)
MD must approve this first. Fleeger, [name]

## 2012-04-17 NOTE — Telephone Encounter (Signed)
Son is calling back to check the status of this referral.  He would like the nurse to call him when this has been completed.

## 2012-04-18 NOTE — Telephone Encounter (Signed)
I will call patient regarding chiropractor before decision, ? Continued back pain.

## 2012-04-18 NOTE — Telephone Encounter (Signed)
I would like PT for rehab.

## 2012-04-20 ENCOUNTER — Telehealth: Payer: Self-pay | Admitting: Family Medicine

## 2012-04-20 NOTE — Telephone Encounter (Signed)
Patient and son are returning call to Dr. Armen Pickup regarding the Chiropractic Referral.

## 2012-04-20 NOTE — Telephone Encounter (Signed)
Left VM. Desired PT for deconditioning. Is back pain still an issue and this is why chiropractic services are requested.? Asked for call back with info. Will wait on info to decide on and send referral.

## 2012-04-23 NOTE — Telephone Encounter (Signed)
Son is calling back to speak to Dr. Armen Pickup about a Chiropractic Referral for his mom.

## 2012-04-25 ENCOUNTER — Telehealth: Payer: Self-pay | Admitting: Family Medicine

## 2012-04-25 NOTE — Telephone Encounter (Signed)
Needs to talk with Dr Armen Pickup about medications that she got when she was in the hospital.  She was very vague so I am not quite sure what it is she wanted. Told her that a nurse would call her back.

## 2012-04-25 NOTE — Telephone Encounter (Signed)
Spoke with pts son.  He states that he does not feel like the PT is needed now because she feels better after taking the keflex.  (states that the [facility] placed was out of the MDs home and they were uncomfortable with that).  Also was asking about pt having some bicep pain and weather it was a side effect vs arthritis. Also asked about max dose of tylenol, consulted with Dr. Armen Pickup and asdvised son that 2000mg  is the max.  Advised I think this would best be discussed at an office visit.  Pts son was agreeable and appt made. Fleeger, [name]

## 2012-05-03 ENCOUNTER — Encounter: Payer: Self-pay | Admitting: Family Medicine

## 2012-05-04 ENCOUNTER — Encounter: Payer: Self-pay | Admitting: Family Medicine

## 2012-05-04 ENCOUNTER — Ambulatory Visit (INDEPENDENT_AMBULATORY_CARE_PROVIDER_SITE_OTHER): Payer: BC Managed Care – PPO | Admitting: Family Medicine

## 2012-05-04 VITALS — BP 161/77 | HR 72 | Temp 98.5°F | Ht 61.0 in | Wt 118.0 lb

## 2012-05-04 DIAGNOSIS — N390 Urinary tract infection, site not specified: Secondary | ICD-10-CM

## 2012-05-04 DIAGNOSIS — E039 Hypothyroidism, unspecified: Secondary | ICD-10-CM

## 2012-05-04 DIAGNOSIS — R5381 Other malaise: Secondary | ICD-10-CM

## 2012-05-04 DIAGNOSIS — R634 Abnormal weight loss: Secondary | ICD-10-CM

## 2012-05-04 DIAGNOSIS — R413 Other amnesia: Secondary | ICD-10-CM

## 2012-05-04 MED ORDER — LEVOTHYROXINE SODIUM 50 MCG PO TABS: 50.0000 ug | ORAL_TABLET | Freq: Every day | ORAL | Status: DC

## 2012-05-04 NOTE — Patient Instructions (Addendum)
Beth Harper and [name],  Thank you for coming in to see me today.   Please decrease synthroid to 50 mcg daily.   I have placed PT referral to [facility].   F/u on Jan 30th with me and Dr. Sheffield Slider at Geriatric clinic and for [facility] recheck.   Dr. Armen Pickup

## 2012-05-07 NOTE — Assessment & Plan Note (Signed)
A: TSH low.  P: adjust synthroid to 50 mcg daily.  Recheck TSH in 4-8 weeks.

## 2012-05-07 NOTE — Assessment & Plan Note (Signed)
This may be early Alzheimers. No physical exam findings of Parkinson: tremor or bradykinesia.  ? Aricpet/namenda may help.

## 2012-05-07 NOTE — Assessment & Plan Note (Signed)
Referral to [facility] chiropractic and wellness.

## 2012-05-07 NOTE — Assessment & Plan Note (Signed)
Treated pain free. No longer incontinent.

## 2012-05-07 NOTE — Progress Notes (Signed)
Subjective:     Patient ID: Beth Harper, female   DOB: [birth year], 71 y.o.   MRN: [id number]  HPI 71 yo F presents with her son [name] to discuss the following:  1. R biceps pain: resolved with tylenol 1000 mg BID.   2. Memory loss: short term memory loss. Patient moved to [place] to live with her youngest son. She has stopped driving but would like to return to driving. Still primary caretaker of her granddaughter.   3. Back pain: resolved.   Review of Systems PHQ-9: 5. 0-3,5,7,9. 1-1,2,4,6,8. Not difficult at all to function.      Objective:   Physical Exam BP 161/77  Pulse 72  Temp 98.5 F (36.9 C) (Oral)  Ht 5\' 1"  (1.549 m)  Wt 118 lb (53.524 kg)  BMI 22.30 kg/m2 General appearance: alert, cooperative and no distress Back: symmetric, no curvature. ROM normal. No CVA tenderness. Extremities: extremities normal, atraumatic, no cyanosis or edema Neurologic: Alert and oriented X 3, normal strength and tone. Normal coordination and gait.   MMSE:  Orientation to time: 4. All but date of month. Orientation of place: 5.  Registration: 3 Attention to calculation: 4 Recall: 0 Language: 2 Repetition: 1 Complex commands: 6  Total: 25 normal-mild cognitive impairment.   Labs: Reviewed labs from previous office visit.  Assessment and Plan:

## 2012-05-07 NOTE — Assessment & Plan Note (Signed)
Wt Readings from Last 3 Encounters:  05/04/12 118 lb (53.524 kg)  04/09/12 117 lb (53.071 kg)  03/16/12 114 lb (51.71 kg)  wt stable on remeron.  Continue remeron.

## 2012-05-14 ENCOUNTER — Telehealth: Payer: Self-pay | Admitting: Family Medicine

## 2012-05-14 NOTE — Telephone Encounter (Signed)
Son is calling back wanting to speak to Dr. Armen Pickup about his FMLA.  It is running out and needs to renew it but wants to speak to Dr. Armen Pickup about this further.  He has specific information that he needs in a letter.

## 2012-05-14 NOTE — Telephone Encounter (Signed)
Son is calling because his mom has been having shoulder and neck pain and the Tylenol is not helping.  They want an Rx for something stronger or a suggestion for something different that she can try.  They use [facility] [address] in [place].  He would appreciate a return call.

## 2012-05-14 NOTE — Telephone Encounter (Signed)
Will forward to Dr. Funches 

## 2012-05-21 ENCOUNTER — Telehealth: Payer: Self-pay | Admitting: Family Medicine

## 2012-05-21 NOTE — Telephone Encounter (Signed)
FMLA extension paperwork for son Beth Harper to be completed by Funches.

## 2012-05-23 MED ORDER — POLYETHYLENE GLYCOL 3350 17 GM/SCOOP PO POWD: 8.5000 g | Freq: Every day | ORAL | Status: DC

## 2012-05-23 NOTE — Telephone Encounter (Signed)
Called patient's son.  Discussed extension of FMLA Form filled out and placed up front for pick up   Patient to see me in f/u in Geri clinic on 06/14/12. Requested appt time between 9-10 AM. Please schedule appt and call with details.

## 2012-05-23 NOTE — Telephone Encounter (Signed)
Patients son is calling to speak to Dr. Armen Pickup.  Dr. Armen Pickup said she will call him a little later this evening.

## 2012-05-23 NOTE — Telephone Encounter (Signed)
Returned call to patient's son [name]. She has been seen by a chiropractor on 05/17/12 who felt that her pain was 2/2 to stiffness from decreased physical activity. Patient reports dizziness upon standing and being on her feet.   Her pain in her neck and shoulder has improved since her initial adjustment.  She is reluctant to return to the chiropractor because of the copay $40.  Recommended 3x weekly visits. May reduce visits to once weekly if copay is $40 per visit.

## 2012-05-31 ENCOUNTER — Telehealth: Payer: Self-pay | Admitting: *Deleted

## 2012-05-31 NOTE — Telephone Encounter (Signed)
Pharmacy calling to verify dosage on the prescription they received for polyethylene glycol powder.  They state patient has been taking 17 grams daily but the lastest rx they received was for 8.5 grams daily.  Wanting to make sure patient is suppose to decrease her dose.  Will forward for Dr. Armen Pickup for review.  Ileana Ladd

## 2012-06-01 NOTE — Telephone Encounter (Signed)
Yes, this dose change is correct.  Called pharmacy left VM with Rx info.

## 2012-06-08 ENCOUNTER — Encounter: Payer: Self-pay | Admitting: Family Medicine

## 2012-06-08 ENCOUNTER — Ambulatory Visit (INDEPENDENT_AMBULATORY_CARE_PROVIDER_SITE_OTHER): Payer: Medicare Other | Admitting: Family Medicine

## 2012-06-08 VITALS — BP 156/79 | HR 63 | Temp 98.2°F | Ht 61.0 in | Wt 119.0 lb

## 2012-06-08 DIAGNOSIS — M67919 Unspecified disorder of synovium and tendon, unspecified shoulder: Secondary | ICD-10-CM

## 2012-06-08 MED ORDER — TRAMADOL HCL 50 MG PO TABS: 50.0000 mg | ORAL_TABLET | Freq: Three times a day (TID) | ORAL | Status: DC | PRN

## 2012-06-08 NOTE — Patient Instructions (Signed)
It was nice to meet you.  I want you to start doing the exercises in the attached hand out.  You can also use the tramadol as needed for pain.  Continue the tylenol 1000 mg twice daily.  Also, try heat or ice on your shoulders for the pain.

## 2012-06-08 NOTE — Assessment & Plan Note (Signed)
Bilaterally, concern she could develop frozen shoulder if she is not able to do rehab.  Rx for tramadol as needed for pain, advised to continue tylenol and to use heat or ice therapy.  Gave hand out with home exercise program, advised to do twice daily, gave thera band.

## 2012-06-08 NOTE — Progress Notes (Signed)
  Subjective:    Patient ID: Beth Harper, female    DOB: 07/16/1940, 72 y.o.   MRN: [id number]  HPI  Ms. Delamora comes in with continued bilateral shoulder pain.  Her son has taken her to see a chiropractor/PT.  The chiropractor put her on a roller table that made her pain worse.  They cannot afford to pay the co-pay for PT 2-3x/week, and the PT says he wants to do the "full treatment."   Patient complains of shoulder pain, pain down in upper arms, it is worst at night when she tries to sleep.  She has pain putting dishes away above her head.  No numbness or tingling in hands/arms.  She is taking tylenol 1000 mg PO bid, which does not seem to be helping.   Review of Systems Pertinent items in HPI    Objective:   Physical Exam BP 156/79  Pulse 63  Temp 98.2 F (36.8 C) (Oral)  Ht 5\' 1"  (1.549 m)  Wt 119 lb (53.978 kg)  BMI 22.48 kg/m2 General appearance: alert, cooperative and no distress Shoulders: Inspection reveals no abnormalities, atrophy or asymmetry. Palpation + tenderness bilaterally over AC joint. Bilateral ROM is limited to 120 degrees in active motion overhead, but can get to 180 passive.  IR normal, to L2 bilaterally Rotator cuff strength difficult to assess- due to pain, full strength of IR and ER.  + Neer and Hawkin's tests bilaterally        Assessment & Plan:

## 2012-06-13 ENCOUNTER — Telehealth: Payer: Self-pay | Admitting: Family Medicine

## 2012-06-13 NOTE — Telephone Encounter (Signed)
Called to check on patient's ability for geri clinic. She is available to come in at 10:15 AM.

## 2012-06-14 ENCOUNTER — Encounter: Payer: Self-pay | Admitting: Family Medicine

## 2012-06-14 ENCOUNTER — Ambulatory Visit (INDEPENDENT_AMBULATORY_CARE_PROVIDER_SITE_OTHER): Payer: Medicare Other | Admitting: Family Medicine

## 2012-06-14 VITALS — BP 183/90 | HR 71 | Ht 61.0 in | Wt 121.0 lb

## 2012-06-14 DIAGNOSIS — D649 Anemia, unspecified: Secondary | ICD-10-CM

## 2012-06-14 DIAGNOSIS — D500 Iron deficiency anemia secondary to blood loss (chronic): Secondary | ICD-10-CM | POA: Insufficient documentation

## 2012-06-14 DIAGNOSIS — E039 Hypothyroidism, unspecified: Secondary | ICD-10-CM

## 2012-06-14 DIAGNOSIS — I10 Essential (primary) hypertension: Secondary | ICD-10-CM

## 2012-06-14 DIAGNOSIS — R634 Abnormal weight loss: Secondary | ICD-10-CM

## 2012-06-14 HISTORY — DX: Iron deficiency anemia secondary to blood loss (chronic): D50.0

## 2012-06-14 LAB — CBC
MCH: 27 pg (ref 26.0–34.0)
MCHC: 31.4 g/dL (ref 30.0–36.0)
Platelets: 305 10*3/uL (ref 150–400)
RDW: 15.1 % (ref 11.5–15.5)

## 2012-06-14 LAB — POCT SEDIMENTATION RATE: POCT SED RATE: 47 mm/hr — AB (ref 0–22)

## 2012-06-14 MED ORDER — AMLODIPINE BESYLATE 2.5 MG PO TABS: 2.5000 mg | ORAL_TABLET | Freq: Every day | ORAL | Status: DC

## 2012-06-14 NOTE — Assessment & Plan Note (Signed)
A: improved with remeron. Appetite is stable. P: continue remeron.

## 2012-06-14 NOTE — Patient Instructions (Addendum)
Mrs. Motz and [name],   Thank you for coming in today.  Regarding your blood pressure- Start Norvasc 2.5 mg daily.   Checking blood work today to evaluate anemia.  Also rechecking TSH.  Dr. Sheffield Slider and I do recommend that you walk with a cane on uneven surfaces.  Also restart calcium and vitamin to maintain bone density.   Dr. Armen Pickup

## 2012-06-14 NOTE — Progress Notes (Signed)
Subjective:     Patient ID: Beth Harper, female   DOB: 12-02-1940, 72 y.o.   MRN: [id number]  HPI 72 yo F presents to geriatric clinic with her son [name] to discuss the following:  1. HTN: compliant with lisinopril. Weight is on the rise with Remeron. Eating well. Does feel lightheaded upon standing sometimes. She is compliant with a low salt diet. Her 3 yo granddaughter Geophysical data processor) is a source of stress.   2. Anemia 2/2 diverticulitis: denies blood per stool. Stool is brown and soft. Taking 1/2 dose of miralax daily. Denies abdominal pain and cramping.   3. Rotator cuff pain: improved with thera bands. Has used a few tramadol. Not taking tylenol. She is very diligent about doing her exercises multiple times daily.   Review of Systems As per HPI     Objective:   Physical Exam BP 183/90  Pulse 71  Ht 5\' 1"  (1.549 m)  Wt 121 lb (54.885 kg)  BMI 22.86 kg/m2 Orthostatic vital signs Lying 170/100 Standing 170/100 General appearance: alert, cooperative and no distress Lungs: clear to auscultation bilaterally Heart: SIS2, SEJM radiating to L axilla , no LE edema.  Neurologic: Grossly normal MSK:  Unassisted sitting to standing: able to perform 8 w/o difficulty  Gait normal based gait. Continuous turn.      Assessment and Plan:

## 2012-06-14 NOTE — Assessment & Plan Note (Addendum)
A: on decreased synthroid dose from 75 mcg to 50 mcg x 6 weeks. Asymptomatic. No evidence of edema or mental status changes.  P: Check TSH level today.

## 2012-06-14 NOTE — Assessment & Plan Note (Signed)
A: elevated off HCTZ.  P: Start norvasc 2.5 mg daily.  F/u 4-6 weeks

## 2012-06-14 NOTE — Assessment & Plan Note (Addendum)
A: secondary to GI losses. Folic acid >11 on recent check.  P: Evaluate further with retic, CBC, sed rate, ferritin and MMA Patient may benefit from iron and/or B12 supplement.

## 2012-06-15 ENCOUNTER — Other Ambulatory Visit: Payer: Self-pay | Admitting: Family Medicine

## 2012-06-15 DIAGNOSIS — E538 Deficiency of other specified B group vitamins: Secondary | ICD-10-CM

## 2012-06-15 DIAGNOSIS — D500 Iron deficiency anemia secondary to blood loss (chronic): Secondary | ICD-10-CM

## 2012-06-15 NOTE — Telephone Encounter (Signed)
Message copied by Dessa Phi on Fri Jun 15, 2012  3:23 PM ------      Message from: Carney Living      Created: Fri Jun 15, 2012  9:33 AM                   ----- Message -----         From: Bonnie [place]         Sent: 06/14/2012   2:07 PM           To: Carney Living, MD

## 2012-06-20 ENCOUNTER — Encounter: Payer: Self-pay | Admitting: Family Medicine

## 2012-06-20 DIAGNOSIS — E538 Deficiency of other specified B group vitamins: Secondary | ICD-10-CM

## 2012-06-20 HISTORY — DX: Deficiency of other specified B group vitamins: E53.8

## 2012-06-20 MED ORDER — FERROUS SULFATE 220 (44 FE) MG/5ML PO ELIX: 440.0000 mg | ORAL_SOLUTION | Freq: Every day | ORAL | Status: DC

## 2012-06-20 MED ORDER — CYANOCOBALAMIN 1000 MCG PO TABS: ORAL_TABLET | ORAL | Status: AC

## 2012-06-20 NOTE — Assessment & Plan Note (Signed)
Low serum ferritin. Oral iron therapy.

## 2012-06-21 ENCOUNTER — Ambulatory Visit: Payer: Medicare Other

## 2012-07-31 ENCOUNTER — Telehealth: Payer: Self-pay | Admitting: Family Medicine

## 2012-07-31 DIAGNOSIS — I10 Essential (primary) hypertension: Secondary | ICD-10-CM

## 2012-07-31 MED ORDER — MIRTAZAPINE 7.5 MG PO TABS: 7.5000 mg | ORAL_TABLET | Freq: Every day | ORAL | Status: DC

## 2012-07-31 MED ORDER — AMLODIPINE BESYLATE 2.5 MG PO TABS: 2.5000 mg | ORAL_TABLET | Freq: Every day | ORAL | Status: DC

## 2012-07-31 NOTE — Telephone Encounter (Signed)
Also hasn't been able to take the B12 - they ([facility]) keep telling him that they don't have it.

## 2012-07-31 NOTE — Telephone Encounter (Signed)
Calling to see if nurse can help him get the iron liquid - [facility] states that they cannot get this and she has not been able to take it - wants to know if this can be called to a different pharmacy Also having a hard time getting the Remeron refilled - call that one to [facility] [employer]

## 2012-07-31 NOTE — Telephone Encounter (Signed)
Sent in refill for remeron and resent amlodipine.   Will wait to hear back about iron.

## 2012-07-31 NOTE — Telephone Encounter (Signed)
Spoke with Beth Harper, below are what we discussed:  1.  Will ask MD to refill Remeron  2. Never picked up Amlodapine 2.5, will need this resent  3.  [facility] to order, but never heard back about the iron.  Advised that he call [facility] to check and see if it was ordered, if not he is going to call around to other pharmacies in [place] to see if one has them the liquid iron.  4. He will pick up B12 OTC as directed.   5.  Pt has had a lot of issue with [facility], advised maybe switching pharmacies and even calling insurance company if she is eligible for mail order.  He will do this. Fleeger, [name]

## 2012-08-02 MED ORDER — FERROUS SULFATE 220 (44 FE) MG/5ML PO ELIX: 440.0000 mg | ORAL_SOLUTION | Freq: Every day | ORAL | Status: DC

## 2012-08-02 NOTE — Addendum Note (Signed)
Addended by: Dessa Phi on: 08/02/2012 04:03 PM   Modules accepted: Orders

## 2012-08-02 NOTE — Telephone Encounter (Signed)
Son checked with [facility] and they have ordered the liquid iron and will be here this afternoon - wanted [place] to go ahead and put the orders in for her to get this.

## 2012-08-02 NOTE — Telephone Encounter (Signed)
Liquid iron rx sent to [facility] [place].

## 2012-08-15 ENCOUNTER — Telehealth: Payer: Self-pay | Admitting: Family Medicine

## 2012-08-15 NOTE — Telephone Encounter (Addendum)
Mr. Mehl calling about the medications his mom is taking for her iron and blood pressure.  The iron liquid medicine has 5 % alcohol.  Not certain if the iron medicine or combination of her taking both is causing her to sleep a lot.  Is taking 2 tsps with 1/5 glass of orange juice.  Want to know if she can reduce it to 1 tsp and maybe this won't  cause her drowsiness.

## 2012-08-16 MED ORDER — FERROUS SULFATE 220 (44 FE) MG/5ML PO ELIX: 220.0000 mg | ORAL_SOLUTION | Freq: Every day | ORAL | Status: DC

## 2012-08-16 NOTE — Telephone Encounter (Signed)
Yes, ok to decrease iron dose to 1 tsp. dose change noted in MAR.  Please inform patient and her son.

## 2012-08-16 NOTE — Telephone Encounter (Signed)
Pt's son, Mr. Sonnenfeld notified.  Emilie Rutter, Darlyne Russian

## 2012-08-17 ENCOUNTER — Other Ambulatory Visit (HOSPITAL_COMMUNITY): Payer: Self-pay | Admitting: Family Medicine

## 2012-08-31 ENCOUNTER — Other Ambulatory Visit: Payer: Self-pay | Admitting: Family Medicine

## 2012-09-06 ENCOUNTER — Other Ambulatory Visit (HOSPITAL_COMMUNITY): Payer: Self-pay | Admitting: Family Medicine

## 2012-10-15 ENCOUNTER — Telehealth: Payer: Self-pay | Admitting: Family Medicine

## 2012-10-15 NOTE — Telephone Encounter (Signed)
Handicap Placard placed in Dr. Armen Pickup box to complete.  Beth Harper

## 2012-10-15 NOTE — Telephone Encounter (Signed)
Pt's son came by to drop off paperwork to be filled out concerning disability parking, he states that pt is still having problems walking long distances.  He prefers the paperwork be mailed to address on file.

## 2012-10-16 NOTE — Telephone Encounter (Signed)
Form signed and placed in to be mailed box.

## 2012-10-18 ENCOUNTER — Other Ambulatory Visit: Payer: Self-pay | Admitting: Family Medicine

## 2012-11-02 ENCOUNTER — Other Ambulatory Visit: Payer: Self-pay | Admitting: Family Medicine

## 2012-11-02 DIAGNOSIS — I10 Essential (primary) hypertension: Secondary | ICD-10-CM

## 2012-11-02 MED ORDER — HYDROCHLOROTHIAZIDE 25 MG PO TABS: ORAL_TABLET | ORAL | Status: DC

## 2012-11-02 NOTE — Telephone Encounter (Signed)
Call patient's son and left a voicemail regarding her blood pressure medication regimen.  Upon review of my office notes she should only be taking Norvasc 2.5 mg daily. However I see that she has Norvasc lisinopril and hydrochlorothiazide ordered by me.  I asked the patient's son to call back with any review of her medication she's taking.

## 2012-12-03 ENCOUNTER — Other Ambulatory Visit: Payer: Self-pay | Admitting: Family Medicine

## 2012-12-04 NOTE — Telephone Encounter (Signed)
Unable to reach pt.  LMOVM of Beth Harper (son) asking for him to call back and schedule appt. Fleeger, [name]

## 2012-12-04 NOTE — Telephone Encounter (Signed)
Refilled remeron. Patient should come in for a visit to see how she is doing on this medication prior to additional refills. Please advise patient.

## 2012-12-31 ENCOUNTER — Encounter: Payer: Self-pay | Admitting: Family Medicine

## 2012-12-31 ENCOUNTER — Ambulatory Visit (INDEPENDENT_AMBULATORY_CARE_PROVIDER_SITE_OTHER): Payer: Medicare Other | Admitting: Family Medicine

## 2012-12-31 VITALS — BP 134/77 | HR 73 | Temp 97.9°F | Wt 124.0 lb

## 2012-12-31 DIAGNOSIS — E039 Hypothyroidism, unspecified: Secondary | ICD-10-CM

## 2012-12-31 DIAGNOSIS — N3941 Urge incontinence: Secondary | ICD-10-CM

## 2012-12-31 DIAGNOSIS — I10 Essential (primary) hypertension: Secondary | ICD-10-CM

## 2012-12-31 DIAGNOSIS — D500 Iron deficiency anemia secondary to blood loss (chronic): Secondary | ICD-10-CM

## 2012-12-31 DIAGNOSIS — L989 Disorder of the skin and subcutaneous tissue, unspecified: Secondary | ICD-10-CM

## 2012-12-31 HISTORY — DX: Disorder of the skin and subcutaneous tissue, unspecified: L98.9

## 2012-12-31 HISTORY — DX: Urge incontinence: N39.41

## 2012-12-31 LAB — CBC
HCT: 31.5 % — ABNORMAL LOW (ref 36.0–46.0)
Hemoglobin: 10.4 g/dL — ABNORMAL LOW (ref 12.0–15.0)
MCHC: 33 g/dL (ref 30.0–36.0)
MCV: 85.6 fL (ref 78.0–100.0)
RDW: 16.7 % — ABNORMAL HIGH (ref 11.5–15.5)

## 2012-12-31 LAB — BASIC METABOLIC PANEL
BUN: 20 mg/dL (ref 6–23)
Chloride: 105 mEq/L (ref 96–112)
Glucose, Bld: 74 mg/dL (ref 70–99)
Potassium: 3.8 mEq/L (ref 3.5–5.3)
Sodium: 141 mEq/L (ref 135–145)

## 2012-12-31 LAB — POCT URINALYSIS DIPSTICK
Glucose, UA: NEGATIVE
Ketones, UA: NEGATIVE
Protein, UA: NEGATIVE
Spec Grav, UA: 1.02
Urobilinogen, UA: 0.2

## 2012-12-31 LAB — POCT UA - MICROSCOPIC ONLY

## 2012-12-31 NOTE — Assessment & Plan Note (Addendum)
Discussed drinking less coffee, going to the bathroom when she feels she needs to go, and using pads as needed. UA with many WBCs and bacteria, though with 5-10 epis as well, so will treat as contaminated.

## 2012-12-31 NOTE — Assessment & Plan Note (Addendum)
Suspect patient had an area of infection on right ring finger prior to her draining it. No signs of infection at this time. Firm area potentially related to scar tissue.

## 2012-12-31 NOTE — Progress Notes (Signed)
  Subjective:    Patient ID: Beth Harper, female    DOB: 1940-09-19, 72 y.o.   MRN: [id number]  HPI HYPERTENSION Disease Monitoring: Blood pressure range-not checking Chest pain, palpitations- no      Dyspnea- no Medications: Compliance- takng  Lightheadedness,Syncope- no   Edema- no  HYPERLIPIDEMIA Disease Monitoring: See symptoms for Hypertension Medications: Compliance- taking  Tolerating medication.   HYPOTHYROIDISM Disease Monitoring Weight changes: no  Skin Changes: no Medication Monitoring Compliance:  Not taking medication at this time    Anemia: not taking iron supplement. Occasional fatigue though attributes this to recent adoption of child.  Right ring finger: question of infection. About 2 weeks ago used sterilized needle to pop and puss came out. Had been red around the area. Not red now. Now has an area of hardness.   Urine problems: has urgency. States when its time to go, she has to go. No dysuria. No stress incontinence. Drinks lots of coffee. No abdominal pain. Patient feels as though she holds her urine for too long and then can't make it to the bathroom.  PMH Smoking Status noted-former smoker, quit about a year ago   Review of Systems see HPI     Objective:   Physical Exam  Constitutional: She appears well-developed and well-nourished.  HENT:  Head: Normocephalic and atraumatic.  Mouth/Throat: Oropharynx is clear and moist.  Eyes: Conjunctivae are normal. Pupils are equal, round, and reactive to light.  Neck: Neck supple. No thyromegaly present.  Cardiovascular: Normal rate, regular rhythm and normal heart sounds.   Pulmonary/Chest: Effort normal and breath sounds normal.  Abdominal: Soft. Bowel sounds are normal. She exhibits no distension. There is tenderness (mild suprapubic ). There is no rebound and no guarding.  Musculoskeletal: She exhibits no edema.  Lymphadenopathy:    She has no cervical adenopathy.  Neurological: She is alert.  Skin:  Skin is warm and dry.  Right ring finger: small area of firmness in lateral region of distal finger. No surrounding erythema, induration, or fluctuance.  BP 134/77  Pulse 73  Temp(Src) 97.9 F (36.6 C) (Oral)  Wt 124 lb (56.246 kg)  BMI 23.44 kg/m2     Assessment & Plan:

## 2012-12-31 NOTE — Assessment & Plan Note (Signed)
Did not tolerate liquid iron. States knocked her out. Plan: check CBC and will likely start on iron tablets.

## 2012-12-31 NOTE — Patient Instructions (Signed)
Nice to meet you. You are doing well. I believe your finger is ok and the firmness is likely related to some scar tissue. There does not appear to be any infection at this time. Please try the pads, drinking less coffee, and going to the bathroom when you feel you need to go to the bathroom for your urination issues. We will check some labs today and will call you with the results.

## 2012-12-31 NOTE — Assessment & Plan Note (Addendum)
Not taking synthroid. No symptoms. Plan: check TSH today. If abnormal will start back on synthroid. Addendum: TSH of 58. Will restart on synthroid 50 mcg.

## 2012-12-31 NOTE — Addendum Note (Signed)
Addended by: [place], Natarsha Hurwitz on: 12/31/2012 02:25 PM   Modules accepted: Orders

## 2012-12-31 NOTE — Assessment & Plan Note (Addendum)
At goal. Plan: continue current amlodipine and lisinopril doses. Check BMET.

## 2013-01-01 MED ORDER — LEVOTHYROXINE SODIUM 50 MCG PO TABS: 50.0000 ug | ORAL_TABLET | Freq: Every day | ORAL | Status: DC

## 2013-01-01 NOTE — Addendum Note (Signed)
Addended by: Glori Luis on: 01/01/2013 05:15 PM   Modules accepted: Orders

## 2013-01-03 ENCOUNTER — Other Ambulatory Visit: Payer: Self-pay | Admitting: Family Medicine

## 2013-01-03 NOTE — Telephone Encounter (Signed)
Refilled remeron

## 2013-01-30 ENCOUNTER — Other Ambulatory Visit: Payer: Self-pay | Admitting: Family Medicine

## 2013-04-05 ENCOUNTER — Other Ambulatory Visit: Payer: Self-pay | Admitting: Family Medicine

## 2013-04-05 NOTE — Telephone Encounter (Signed)
Refilled for one month. Please inform the patient she should come in for an appointment for follow-up.

## 2013-04-05 NOTE — Telephone Encounter (Signed)
Son informed.  Appt made. Beth Harper, Beth Harper [place]

## 2013-04-29 ENCOUNTER — Ambulatory Visit: Payer: Medicare Other | Admitting: Family Medicine

## 2013-05-22 ENCOUNTER — Ambulatory Visit (INDEPENDENT_AMBULATORY_CARE_PROVIDER_SITE_OTHER): Payer: 59 | Admitting: Family Medicine

## 2013-05-22 ENCOUNTER — Encounter: Payer: Self-pay | Admitting: Family Medicine

## 2013-05-22 VITALS — BP 140/80 | HR 71 | Temp 97.7°F | Ht 61.0 in | Wt 128.6 lb

## 2013-05-22 DIAGNOSIS — E785 Hyperlipidemia, unspecified: Secondary | ICD-10-CM

## 2013-05-22 DIAGNOSIS — I10 Essential (primary) hypertension: Secondary | ICD-10-CM

## 2013-05-22 DIAGNOSIS — E039 Hypothyroidism, unspecified: Secondary | ICD-10-CM

## 2013-05-22 NOTE — Assessment & Plan Note (Signed)
Patient taking synthroid. No symptoms at this time. Will continue current dosing and check TSH in February.

## 2013-05-22 NOTE — Patient Instructions (Addendum)
Nice to see you again. Please take all your medications as prescribed.  Please discuss your granddaughter's behavior with Dr Armen PickupFunches at her next appointment.

## 2013-05-22 NOTE — Assessment & Plan Note (Signed)
Patient is at goal. Will continue current regimen. Reinforced need to take her medications daily. Plan to check BMET at next visit.

## 2013-05-22 NOTE — Progress Notes (Signed)
Patient ID: Beth CastleDorothy Harper, female   DOB: 14-Jan-1941, 73 y.o.   MRN: [id number]  Beth AlarEric Malee Grays, MD Phone: [phone]  Beth CastleDorothy Harper is a 73 y.o. female who presents today for routine f/u.  HYPOTHYROIDISM Disease Monitoring Weight changes: some weight gain  Skin Changes: no Palpitations: no Heat/Cold intolerance: no Medication Monitoring Compliance:  taking    HYPERTENSION Disease Monitoring Home BP Monitoring not checking Chest pain- no    Dyspnea- no Medications Compliance-  Intermittently taking.  Edema- no  HYPERLIPIDEMIA Symptoms Chest pain on exertion:  no   Leg claudication:   no Medications: Compliance- taking  Muscle aches- no  Dealing with grand daughter that has an attitude issue. Question of this causing some depression, sadness, moping per patients son. Though patient feels that this is frustration with her granddaughters disrespect.     Past Medical History  Diagnosis Date  . Hypertension   . Diverticula, small intestine   . Hyperlipidemia   . Hypothyroidism     History  Smoking status  . Former Smoker  . Quit date: 05/16/2010  Smokeless tobacco  . Never Used    No family history on file.  Current Outpatient Prescriptions on File Prior to Visit  Medication Sig Dispense Refill  . acetaminophen (TYLENOL) 500 MG tablet Take 1,000 mg by mouth 2 (two) times daily. For pain      . amLODipine (NORVASC) 2.5 MG tablet TAKE ONE TABLET BY MOUTH EVERY DAY  90 tablet  0  . Calcium Carbonate-Vitamin D (CALCIUM-VITAMIN D) 500-200 MG-UNIT per tablet Take 1 tablet by mouth 2 (two) times daily with a meal.  60 tablet  11  . cyanocobalamin ([facility] VITAMIN B12) 1000 MCG tablet One tablet twice daily for two weeks, then one tablet daily      . famotidine (PEPCID) 20 MG tablet TAKE ONE TABLET BY MOUTH TWICE DAILY  60 tablet  11  . ferrous sulfate 220 (44 FE) MG/5ML solution Take 5 mLs (220 mg total) by mouth daily.  480 mL  11  . levothyroxine (SYNTHROID, LEVOTHROID) 50  MCG tablet Take 1 tablet (50 mcg total) by mouth daily.  30 tablet  1  . lisinopril (PRINIVIL,ZESTRIL) 20 MG tablet TAKE TWO TABLETS BY MOUTH EVERY DAY  180 tablet  3  . mirtazapine (REMERON) 7.5 MG tablet TAKE ONE TABLET BY MOUTH ONCE DAILY AT BEDTIME  30 tablet  0  . polyethylene glycol powder (GLYCOLAX/MIRALAX) powder Take 8.5 g by mouth daily.  3350 g  1  . pravastatin (PRAVACHOL) 40 MG tablet TAKE TWO TABLETS BY MOUTH ONCE DAILY  180 tablet  0  . traMADol (ULTRAM) 50 MG tablet Take 1 tablet (50 mg total) by mouth every 8 (eight) hours as needed for pain.  30 tablet  1   No current facility-administered medications on file prior to visit.    ROS: Per HPI   Physical Exam Filed Vitals:   05/22/13 1045  BP: 140/80  Pulse: 71  Temp: 97.7 F (36.5 C)    Physical Examination: General appearance - alert, well appearing, and in no distress Neck - supple, no significant adenopathy, thyroid exam: thyroid is normal in size without nodules or tenderness Chest - clear to auscultation, no wheezes, rales or rhonchi, symmetric air entry Heart - normal rate, regular rhythm, normal S1, S2, no murmurs, rubs, clicks or gallops Extremities - no pedal edema noted  PHQ9 of 6  Assessment/Plan: Please see individual problem list.   Patient is to discuss granddaughters behavior  at next visit with Dr Armen Pickup.

## 2013-05-22 NOTE — Assessment & Plan Note (Signed)
No symptoms. Continue pravastatin daily.

## 2013-06-25 ENCOUNTER — Other Ambulatory Visit: Payer: Self-pay | Admitting: Family Medicine

## 2013-09-06 ENCOUNTER — Other Ambulatory Visit: Payer: Self-pay | Admitting: Family Medicine

## 2013-09-06 NOTE — Telephone Encounter (Signed)
Will refill medications. Please advise the patient she should come in for follow-up in the next month. Thanks.

## 2013-09-09 ENCOUNTER — Encounter: Payer: Self-pay | Admitting: *Deleted

## 2013-09-09 NOTE — Telephone Encounter (Signed)
LM with patient's son.  He will inform patient that she will need an appointment. Cleota Pellerito,CMA

## 2013-09-09 NOTE — Progress Notes (Signed)
Prior authorization received from [facility] for Pravastatin 40 mg tab.  PA form placed in provider box for review.  Clovis Puamika L Keshan Reha, RN

## 2013-09-11 NOTE — Progress Notes (Signed)
PA form faxed to [facility] for review.  Chaka Boyson L Ralph Brouwer, RN  

## 2013-09-11 NOTE — Progress Notes (Signed)
Patient ID: Beth CastleDorothy Harper, female   DOB: 1940-11-29, 73 y.o.   MRN: [id number] Prior auth filled out and placed in [employer] Martin's box.

## 2013-09-12 ENCOUNTER — Other Ambulatory Visit: Payer: Self-pay | Admitting: Family Medicine

## 2013-09-12 MED ORDER — PRAVASTATIN SODIUM 80 MG PO TABS: 80.0000 mg | ORAL_TABLET | Freq: Every day | ORAL | Status: DC

## 2013-09-12 NOTE — Progress Notes (Signed)
Prior authorization for pravastatin 40 mg tab was denied due to quantity limit requirement.  Plan allows you to receive 1 tab per day; pt prescribed 2 tabs per day.  Clovis Puamika L Martin, RN

## 2013-09-12 NOTE — Progress Notes (Signed)
Patient ID: [name], female   DOB: 1940/08/08, 73 y.o.   MRN: [id number] Changed prescription to pravastatin 80 mg. Will see if this is approved.

## 2013-10-04 ENCOUNTER — Other Ambulatory Visit: Payer: Self-pay | Admitting: Family Medicine

## 2013-10-04 ENCOUNTER — Other Ambulatory Visit (HOSPITAL_COMMUNITY): Payer: Self-pay | Admitting: Family Medicine

## 2013-10-08 NOTE — Telephone Encounter (Signed)
Patient should be advised she needs to follow-up in clinic in the next month. I will give one month of refills.

## 2013-10-08 NOTE — Telephone Encounter (Signed)
appt 10/15/13. Beth Harper

## 2013-10-15 ENCOUNTER — Ambulatory Visit (INDEPENDENT_AMBULATORY_CARE_PROVIDER_SITE_OTHER): Payer: Medicare Other | Admitting: Family Medicine

## 2013-10-15 ENCOUNTER — Encounter: Payer: Self-pay | Admitting: Family Medicine

## 2013-10-15 VITALS — BP 150/86 | HR 58 | Temp 97.1°F | Ht 61.0 in | Wt 124.0 lb

## 2013-10-15 DIAGNOSIS — N3941 Urge incontinence: Secondary | ICD-10-CM

## 2013-10-15 DIAGNOSIS — E785 Hyperlipidemia, unspecified: Secondary | ICD-10-CM

## 2013-10-15 DIAGNOSIS — E039 Hypothyroidism, unspecified: Secondary | ICD-10-CM

## 2013-10-15 DIAGNOSIS — I10 Essential (primary) hypertension: Secondary | ICD-10-CM

## 2013-10-15 LAB — BASIC METABOLIC PANEL
BUN: 15 mg/dL (ref 6–23)
CALCIUM: 9.4 mg/dL (ref 8.4–10.5)
CO2: 28 meq/L (ref 19–32)
Chloride: 106 mEq/L (ref 96–112)
Creat: 1.12 mg/dL — ABNORMAL HIGH (ref 0.50–1.10)
Glucose, Bld: 82 mg/dL (ref 70–99)
Potassium: 4.2 mEq/L (ref 3.5–5.3)
SODIUM: 138 meq/L (ref 135–145)

## 2013-10-15 LAB — TSH: TSH: 15.722 u[IU]/mL — ABNORMAL HIGH (ref 0.350–4.500)

## 2013-10-15 MED ORDER — AMLODIPINE BESYLATE 2.5 MG PO TABS: ORAL_TABLET | ORAL | Status: DC

## 2013-10-15 MED ORDER — FAMOTIDINE 20 MG PO TABS: ORAL_TABLET | ORAL | Status: DC

## 2013-10-15 MED ORDER — PRAVASTATIN SODIUM 80 MG PO TABS: 80.0000 mg | ORAL_TABLET | Freq: Every day | ORAL | Status: DC

## 2013-10-15 MED ORDER — LEVOTHYROXINE SODIUM 50 MCG PO TABS: ORAL_TABLET | ORAL | Status: DC

## 2013-10-15 NOTE — Assessment & Plan Note (Signed)
Tolerating medication well. Will refill pravastatin now. F/u in 3 months.

## 2013-10-15 NOTE — Patient Instructions (Signed)
Nice to see you. Please continue to take your blood pressure medications. I have sent refills in to the pharmacy for you. We will call you with the results of your labs.

## 2013-10-15 NOTE — Assessment & Plan Note (Addendum)
TSH above normal range. Will increase synthroid to 75 mcg daily. F/u in 6 months.

## 2013-10-15 NOTE — Progress Notes (Signed)
Patient ID: Beth Harper, female   DOB: Jan 17, 1941, 73 y.o.   MRN: [id number]  Marikay Alar, MD Phone: [phone]  Beth Harper is a 73 y.o. female who presents today for f/u.  HYPERTENSION Disease Monitoring Home BP Monitoring intermittently checking, last was 137/119 per her son Chest pain- no    Dyspnea- no Medications Compliance-  Taking, though has been out of amlodipine for 2 weeks due to pharmacy issue.  Edema- no  HYPERLIPIDEMIA Symptoms Chest pain on exertion:  no   Leg claudication:   no Medications: Compliance- taking Right upper quadrant pain- no  Muscle aches- no  Urinary frequency: notes this has been going on for more than a year, going back to when she saw Dr Armen Pickup as her PCP. Notes that she drinks more water than previously and has to go to the restroom more often. Also notes some urgency in that when she has to go she needs to get to the bathroom quickly or will wet herself. States they were told this was likely related to a medication side effect by Dr Armen Pickup. She drinks quite a bit of coffee according to her son. She went to the restroom just prior to my coming in the room so no urine could be collected. She denies dysuria.  Patient is a former smoker.   ROS: Per HPI   Physical Exam Filed Vitals:   10/15/13 0931  BP: 150/86  Pulse: 58  Temp: 97.1 F (36.2 C)    Gen: Well NAD HEENT: PERRL,  MMM Lungs: CTABL Nl WOB Heart: RRR no MRG Abd: soft, NT, ND Exts: Non edematous BL  LE, warm and well perfused.   Assessment/Plan: Please see individual problem list.

## 2013-10-15 NOTE — Assessment & Plan Note (Addendum)
At goal. Has not had amlodipine over the past 2 weeks. Will refill this. Check BMET given patient is on lisinopril. F/u in 3 months.

## 2013-10-16 ENCOUNTER — Telehealth: Payer: Self-pay | Admitting: *Deleted

## 2013-10-16 MED ORDER — LEVOTHYROXINE SODIUM 75 MCG PO TABS: ORAL_TABLET | ORAL | Status: DC

## 2013-10-16 NOTE — Telephone Encounter (Signed)
Message copied by Henri Medal on Wed Oct 16, 2013  9:18 AM ------      Message from: Birdie Sons, ERIC G      Created: Wed Oct 16, 2013  9:13 AM       Patients TSH is elevated above normal meaning we are not adequately treating her hypothyroidism. I will send in a new prescription for the patient to take. It will be for synthroid 75 mcg. We will monitor her on this and recheck a TSH in 6 months. Please inform the patient. ------

## 2013-10-16 NOTE — Telephone Encounter (Signed)
Tried to reach patient but there was no answer on line. Will try to reach her again later today if not we will send her a letter. Argie Applegate,CMA

## 2013-10-16 NOTE — Assessment & Plan Note (Addendum)
Continues to have issues with this. She was not able to give urine today. If continues will need to obtain UA and then may be able to start on medication to help with this issue.

## 2013-10-16 NOTE — Telephone Encounter (Signed)
Spoke with patient and son and they are aware of this. Jazmin Hartsell,CMA

## 2013-12-16 ENCOUNTER — Ambulatory Visit: Payer: 59 | Admitting: Family Medicine

## 2013-12-18 ENCOUNTER — Ambulatory Visit (INDEPENDENT_AMBULATORY_CARE_PROVIDER_SITE_OTHER): Payer: Medicare Other | Admitting: Family Medicine

## 2013-12-18 ENCOUNTER — Encounter: Payer: Self-pay | Admitting: Family Medicine

## 2013-12-18 ENCOUNTER — Ambulatory Visit: Payer: 59 | Admitting: Family Medicine

## 2013-12-18 VITALS — BP 151/80 | HR 66 | Temp 98.1°F | Wt 113.0 lb

## 2013-12-18 DIAGNOSIS — I951 Orthostatic hypotension: Secondary | ICD-10-CM

## 2013-12-18 DIAGNOSIS — R55 Syncope and collapse: Secondary | ICD-10-CM

## 2013-12-18 HISTORY — DX: Orthostatic hypotension: I95.1

## 2013-12-18 NOTE — Assessment & Plan Note (Signed)
Pertinent S&O  No signs of cardiovascular or neurological etiology  Likely due to stress associated with 75 y.o granddaughter  BP well controlled today Assessment  Discussed medications, SSRI and given info. Told her I didn't think this would help as her problems are situational Plan  Advised discussing granddaughter's behavior with her PCP  See will consider Anxiety/depression medication and f/u with her PCP

## 2013-12-18 NOTE — Progress Notes (Signed)
   Subjective:    Patient ID: Beth Harper, female    DOB: 1940/07/24, 73 y.o.   MRN: [id number]  HPI Comments: Ms Beth Harper comes in today for evaluation of presyncope that occurred Friday.  She reports standing in her kitchen when her legs felt weak, she got dizzy and had to be helped to sit down by her son. She think this was likely caused by multiple factors: she was stung by a bee the day before , and she notes a great deal of stress in her life associated with caring for her 75 year old granddaughter. She endorses HA, vague dizziness today, and tension all of which she reports is exacerbated by her granddaughters attitude. She is brought in by her son today who questions whether medications could help with her anxiety/tension. She denies any CP, SOB, palpitations, fevers, or diaphoresis now or at the time of her episode. Denies any new medications but reports having her BP medication increased recently.    Review of Systems See HPI     Objective:   Physical Exam  Constitutional: She is oriented to person, place, and time. She appears well-developed and well-nourished.  Eyes: Conjunctivae and EOM are normal. Pupils are equal, round, and reactive to light.  Cardiovascular: Normal rate, regular rhythm and normal heart sounds.   No murmur heard. Pulmonary/Chest: Effort normal and breath sounds normal. No respiratory distress.  Neurological: She is alert and oriented to person, place, and time. No cranial nerve deficit. Coordination normal.   Assessment/Plan:      See Problem Focused Assessment & Plan

## 2013-12-20 IMAGING — XA IR ANGIO/VISCERAL SELECTIVE EA VESSEL WO/W FLUSH
1 series · 12 of 24 positions shown · non-contrast
Comparison: none

Clinical Data/Indication: GASTROINTESTINAL BLEEDING.  THE PATIENT
HAS BEEN TRANSFUSED 2 UNITS OF PACKED RED BLOOD CELLS.

SELECTIVE VISCERAL ARTERIOGRAPHY
Sedation: Versed 2.0 mg, Fentanyl 75 mg.
Total Moderate Sedation Time: 106 minutes.
Contrast Volume: 200 ml [id number].
Fluoroscopy Time: 28.8 minutes.
Procedure: The procedure, risks, benefits, and alternatives were
explained to the patient. Questions regarding the procedure were
encouraged and answered. The patient understands and consents to
the procedure.
The right groin was prepped with betadine in a sterile fashion, and
a sterile drape was applied covering the operative field. A sterile
gown and sterile gloves were used for the procedure.
Pain micropuncture needle was inserted into the right common
femoral artery and removed over a 3303 wire which was upsized to a
Benson wire.  A 5-French sheath was inserted.
A 5-French pigtail catheter was advanced into the aorta, followed
by lateral and AP aortography.
A 5-French Cobra II catheter was then advanced into the common
origin of the celiac axis and SMA.  Celiac angiography was
performed.  SMA angiography was performed.  This demonstrated a
subtle contrast extravasation in the ascending colon from a branch
of the right colic artery.
A micro catheter was then advanced into the distal branches of  the
right colic artery and subsequent micro catheter angiography was
performed.  Exhausted efforts were performed to delineate the
vessel leading to the area of extravasation.  During the second
half of this procedure, the extravasation resolved. 100 mg intra-
arterial nitroglycerin was also administered as a provocative
measured.
The micro catheter and catheter were removed.  Femoral angiography
was performed.  Hemostasis was achieved with Exoseal device.

[Series 1: run · 12 of 246 slices shown]
[im 11/246]
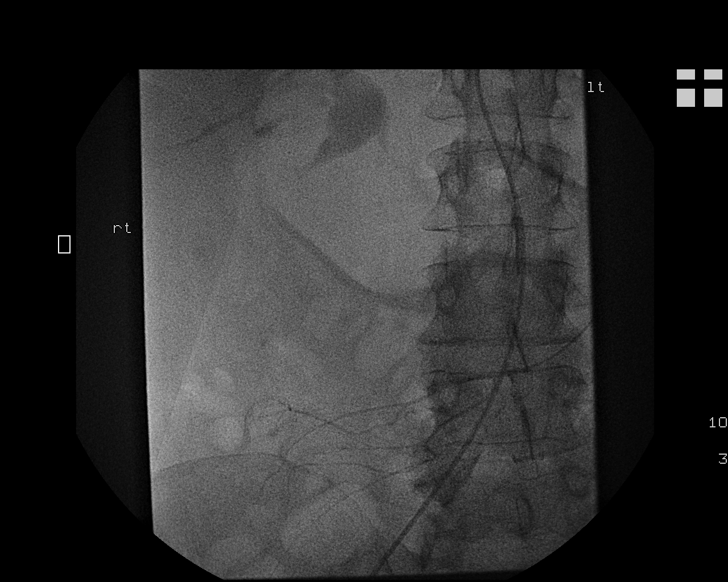
[im 32/246]
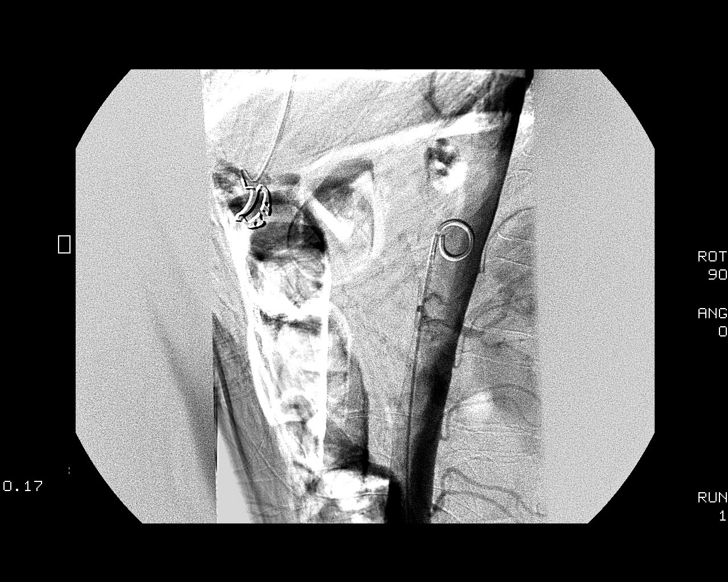
[im 54/246]
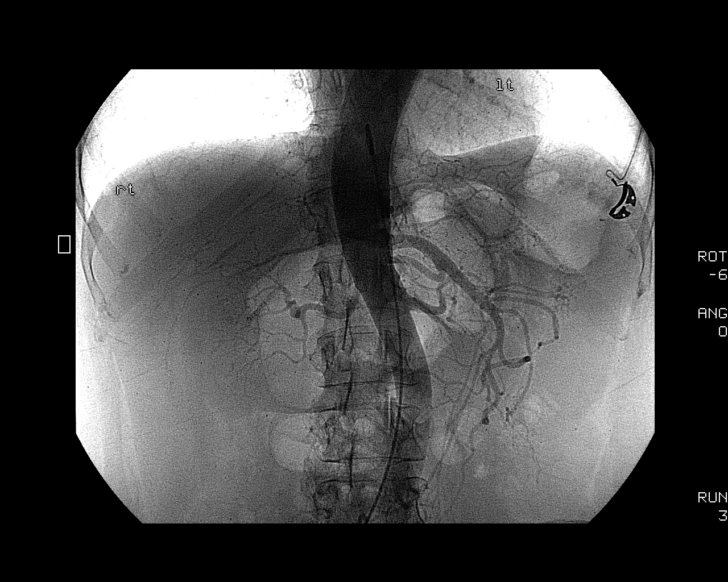
[im 75/246]
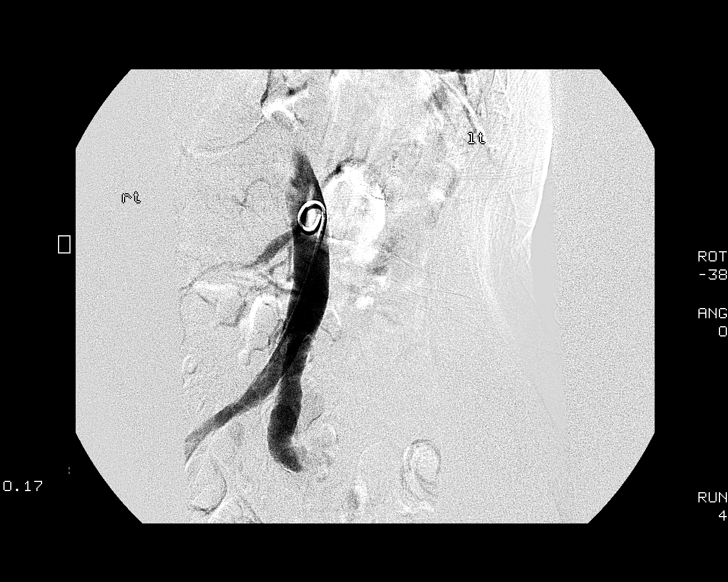
[im 96/246]
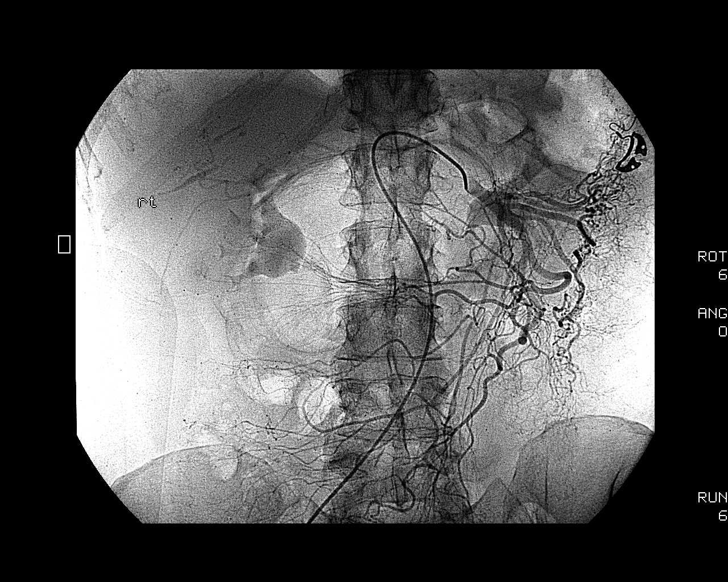
[im 118/246]
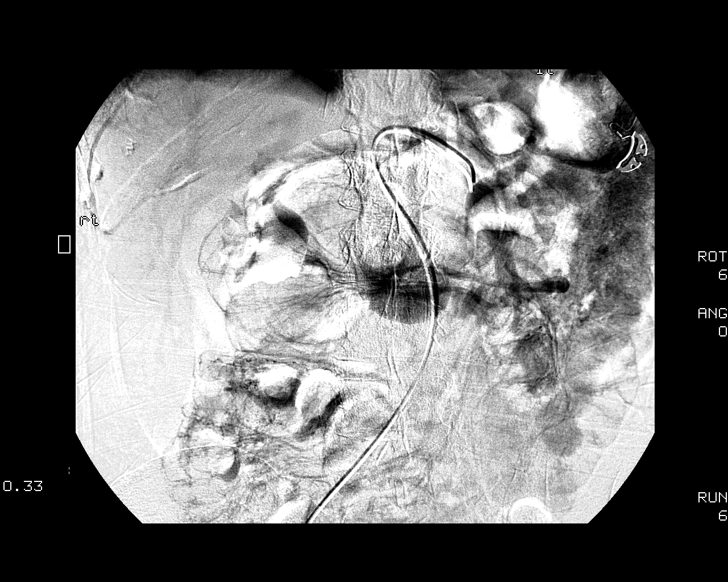
[im 139/246]
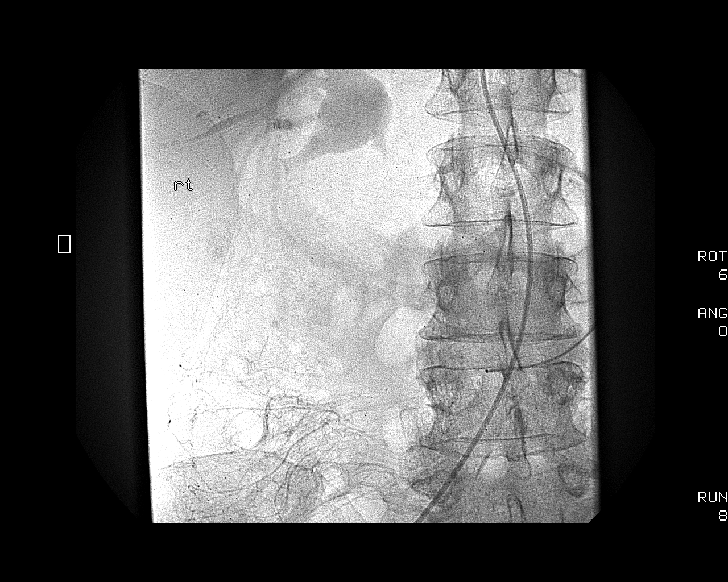
[im 160/246]
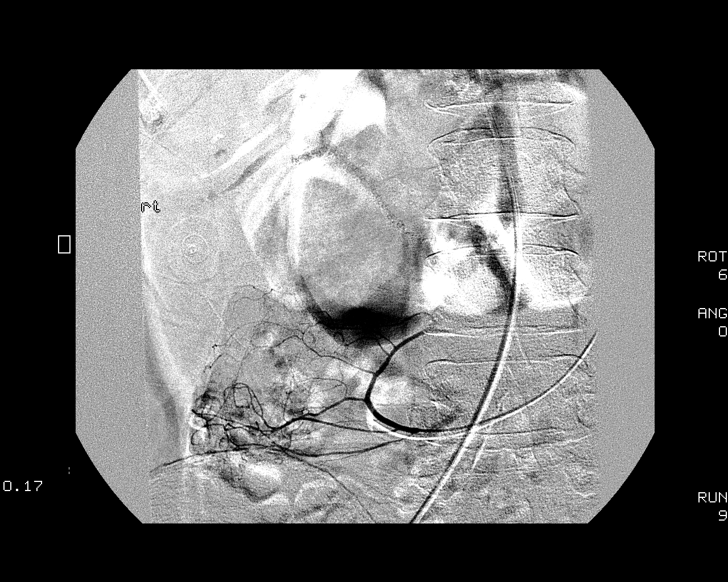
[im 182/246]
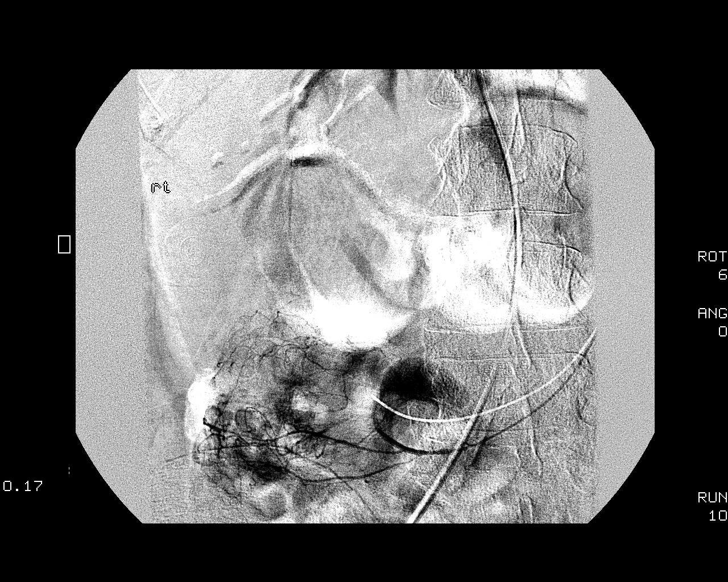
[im 203/246]
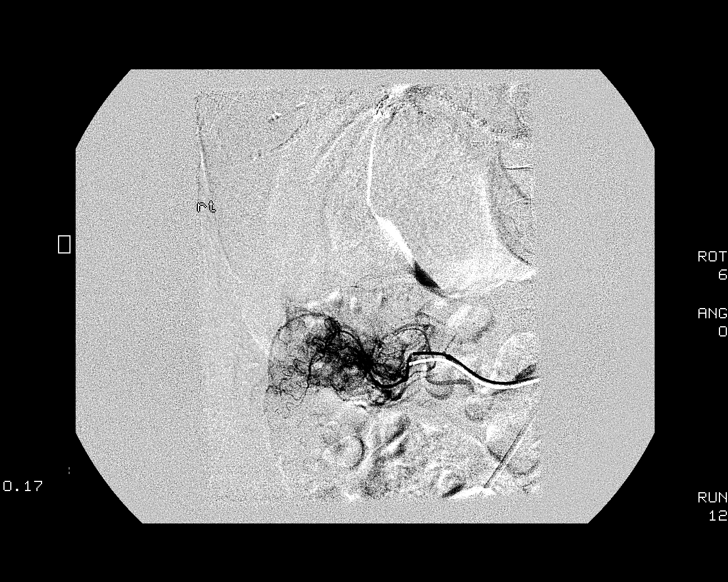
[im 224/246]
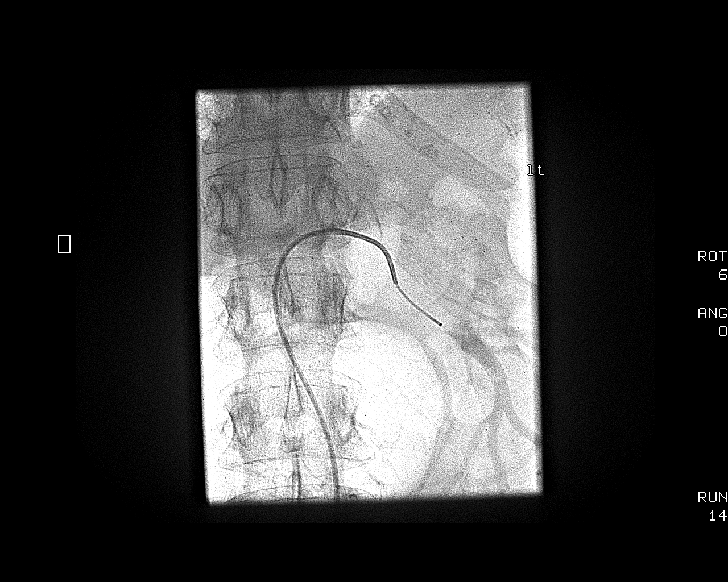
[im 246/246]
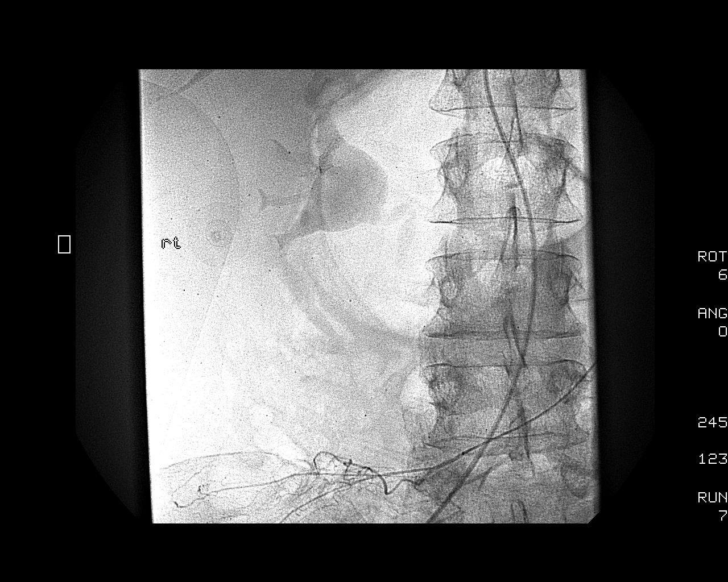

[12 of 24 positions shown; findings below may reference images not displayed]

FINDINGS: AP and lateral aortography demonstrates distorted anatomy
secondary to prior left hemicolectomy.  There is a common origin
for the celiac and SMA.

Angiography of the celiac axis demonstrates no evidence of contrast
extravasation.

SMA angiography extended to the venous phase demonstrated a subtle
extravasation of contrast in the ascending colon from a branch of
the right colic artery.

Subsequent several angiograms demonstrate selective injection of
subselective branches of the right colic artery. The earlier
angiograms demonstrates extravasation of contrast.  Subsequent
angiograms demonstrate resolution of the gastrointestinal bleeding.

Complications: None.
IMPRESSION: A subtle focus of bleeding from the right colic artery was
delineated during the first half the procedure.  During the
exhausted efforts to achieve embolization, the contrast
extravasation resolved.  Please note that anatomy is markedly
distorted secondary to prior surgery.  There is also a common
origin for the celiac and SMA.

## 2014-01-13 ENCOUNTER — Ambulatory Visit: Payer: Medicare Other | Admitting: Family Medicine

## 2014-01-14 ENCOUNTER — Encounter: Payer: Self-pay | Admitting: Family Medicine

## 2014-01-14 ENCOUNTER — Ambulatory Visit (HOSPITAL_COMMUNITY)
Admission: RE | Admit: 2014-01-14 | Discharge: 2014-01-14 | Disposition: A | Discharge status: Home / Self Care | Payer: Medicare Other | Source: Ambulatory Visit | Attending: Family Medicine | Admitting: Family Medicine

## 2014-01-14 ENCOUNTER — Ambulatory Visit (INDEPENDENT_AMBULATORY_CARE_PROVIDER_SITE_OTHER): Payer: Medicare Other | Admitting: Family Medicine

## 2014-01-14 VITALS — BP 183/81 | HR 73 | Temp 98.2°F | Ht 61.0 in | Wt 117.0 lb

## 2014-01-14 DIAGNOSIS — R55 Syncope and collapse: Secondary | ICD-10-CM

## 2014-01-14 DIAGNOSIS — E039 Hypothyroidism, unspecified: Secondary | ICD-10-CM

## 2014-01-14 NOTE — Assessment & Plan Note (Signed)
Repeat TSH and free T4 today.

## 2014-01-14 NOTE — Patient Instructions (Addendum)
The symptoms you are having may be related to your blood pressure or thyroid. We checked an EKG and thyroid studies today. When we checked your blood pressure between sitting and standing it showed that you are orthostatic. This could be from being dehydrated or from your nerves and blood vessels not responding as they should. Over the next few days you should make sure to drink at least 48oz of water a day.  The thyroid tests will come back in the next few days. I would like you to follow up at the end of the week to check your blood pressure and see how you are doing.   Make sure you take the thyroid medication 1 hour before eating.

## 2014-01-14 NOTE — Progress Notes (Signed)
Patient ID: Beth Harper, female   DOB: 1940/09/07, 73 y.o.   MRN: [id number]   Subjective:    Patient ID: Beth Harper, female    DOB: Oct 09, 1940, 73 y.o.   MRN: [id number]  HPI  CC: Dizzy  # Dizziness:  Symptoms have been present for at least the last 2 weeks  Previously seen in clinic after a "presyncopal" episode 1 month ago, noted to have significant stressors and anxiety at that time: son is living with her as well as her daughter's 12yo granddaughter that has what sounds like ODD, patient would like granddaughter to leave the house  She describes symptoms as lightheadedness, funny feeling in her head, she also gets blurry vision at times. Denies room spinning sensation. Denies loss of consciousness.  She is currently symptomatic  No history of arrhythmias or cardiac issues ROS: No CP, no SOB, hasn't noticed weight loss, no loss of hair, no palpitations, no feelings of heart racing, no PND   Review of Systems   See HPI for ROS. All other systems reviewed and are negative. Objective:  BP 183/81  Pulse 73  Temp(Src) 98.2 F (36.8 C) (Oral)  Ht  (1.549 m)  Wt 117 lb (53.071 kg)  BMI 22.12 kg/m2 Vitals reviewed. Orthostatic vitals below  Lying: 164/90 Sitting: 161/89 Standing: 132/85  General: NAD HEENT: PERRL, EOMI CV: RRR, normal s1/s2, no murmurs. 2+ radial pulses bilaterally Resp: CTAB, normal effort Ext: no edema no cyanosis, wwp Neuro: alert and oriented. CN grossly normal, no nystagmus.   Assessment & Plan:  See Problem List Documentation

## 2014-01-14 NOTE — Assessment & Plan Note (Signed)
Etiology likely orthostasis and hypertension given orthostatic vitals today. EKG normal sinus brady with borderline prolonged QTc. Low suspicion for other cardiac etiology as she is not having palpitations, chest pains, arrhythmias. Plan: thyroid studies today. Instructed to maintain adequate hydration over next few days. May need BP medications adjusted depending on orthostatics at that time. F/u in 2 days.

## 2014-01-15 ENCOUNTER — Telehealth: Payer: Self-pay | Admitting: Family Medicine

## 2014-01-15 LAB — TSH: TSH: 2.495 u[IU]/mL (ref 0.350–4.500)

## 2014-01-15 LAB — T4, FREE: Free T4: 0.9 ng/dL (ref 0.80–1.80)

## 2014-01-15 NOTE — Telephone Encounter (Signed)
Son calls for lab results from OV with Dr. Waynetta Sandy 9/1.

## 2014-01-15 NOTE — Telephone Encounter (Signed)
Will confirm normal results with MD. Beth Harper, Beth Harper

## 2014-01-16 ENCOUNTER — Encounter: Payer: Self-pay | Admitting: *Deleted

## 2014-01-16 NOTE — Telephone Encounter (Signed)
Attempted return phone calls with both numbers in chart, no answer and no voicemail box setup. The lab results (thyroid studies) were normal and no adjustments need to be made to the synthroid medication. -Dr Waynetta Sandy

## 2014-01-16 NOTE — Telephone Encounter (Signed)
Letter mailed to patient with normal results. Jazmin Hartsell,CMA  

## 2014-03-04 ENCOUNTER — Ambulatory Visit: Payer: Medicare Other | Admitting: Family Medicine

## 2014-03-21 ENCOUNTER — Other Ambulatory Visit: Payer: Self-pay | Admitting: Family Medicine

## 2014-03-25 NOTE — Telephone Encounter (Signed)
Unable to reach patient due to number not working.  Has a physical scheduled on 04/14/14 with PCP. Beth Harper,CMA

## 2014-03-25 NOTE — Telephone Encounter (Signed)
Patient needs follow-up for her blood pressure in the next month. Please inform her of this. Refill sent in for one month.

## 2014-04-14 ENCOUNTER — Encounter: Payer: Medicare Other | Admitting: Family Medicine

## 2014-04-30 ENCOUNTER — Other Ambulatory Visit: Payer: Self-pay | Admitting: *Deleted

## 2014-05-01 MED ORDER — MIRTAZAPINE 7.5 MG PO TABS: 7.5000 mg | ORAL_TABLET | Freq: Every day | ORAL | Status: DC

## 2014-05-01 MED ORDER — LISINOPRIL 20 MG PO TABS: 40.0000 mg | ORAL_TABLET | Freq: Every day | ORAL | Status: DC

## 2014-05-01 NOTE — Telephone Encounter (Signed)
Refills given for one month. Patient needs to be seen for follow-up in clinic for her blood pressure as soon as possible given that it was elevated to the 180's systolic at her last visit 3 months ago. Please inform the patient. Thanks.

## 2014-05-01 NOTE — Telephone Encounter (Signed)
LM for patient and son Tawni CarnesHorace to call back.  Please schedule patient for an appt to see PCP in January.  Thanks [employer] BrandsJazmin Vertis Bauder,CMA

## 2014-06-05 ENCOUNTER — Other Ambulatory Visit: Payer: Self-pay | Admitting: Family Medicine

## 2014-06-09 NOTE — Telephone Encounter (Signed)
Patient needs to be advised to follow-up in the next month. Her blood pressure at her last visit was quite elevated and she was supposed to follow-up in September. Refills given for one month.

## 2014-06-09 NOTE — Telephone Encounter (Signed)
LMOVM for pt to return call.  Please inform of needed appt. Beth Harper, Maryjo RochesterJessica Dawn

## 2014-07-24 ENCOUNTER — Ambulatory Visit (INDEPENDENT_AMBULATORY_CARE_PROVIDER_SITE_OTHER): Payer: Medicare Other | Admitting: Family Medicine

## 2014-07-24 ENCOUNTER — Encounter: Payer: Self-pay | Admitting: Family Medicine

## 2014-07-24 VITALS — BP 159/87 | HR 56 | Temp 97.7°F | Ht 61.0 in | Wt 126.0 lb

## 2014-07-24 DIAGNOSIS — I951 Orthostatic hypotension: Secondary | ICD-10-CM | POA: Diagnosis not present

## 2014-07-24 DIAGNOSIS — I10 Essential (primary) hypertension: Secondary | ICD-10-CM

## 2014-07-24 MED ORDER — LISINOPRIL 20 MG PO TABS: 20.0000 mg | ORAL_TABLET | Freq: Every day | ORAL | Status: DC

## 2014-07-24 NOTE — Patient Instructions (Signed)
Nice to see you. We want you to take lisinopril 20 mg daily.  Stop taking the amlodipine. You need to stand up slowly as this will help you not get light headed.  If you get more light headed, have palpitations, chest pain, trouble breathing, or pass our you need to be evaluated right away.

## 2014-07-25 NOTE — Progress Notes (Signed)
Patient ID: Beth Harper, female   DOB: 1941-03-28, 74 y.o.   MRN: [id number]  Beth AlarEric Tramell Piechota, MD Phone: [phone]  Beth Harper is a 74 y.o. female who presents today for f/u.  HYPERTENSION Disease Monitoring Home BP Monitoring not checking Chest pain- no    Dyspnea- no Medications Compliance-  Taking intermittently, maybe takes 3 days a week. Lightheadedness-  Yes, see below  Edema- no  Light headedness: continues to have this after going from laying or seated to standing. Only occurs if she does this too quickly. Is ok if she rises slowly. No LOC. No palpitations. Previous EKG with sinus arhythmia.   Patient is a former smoker.   ROS: Per HPI   Physical Exam Filed Vitals:   07/24/14 1216  BP: 159/87  Pulse: 56  Temp: 97.7 F (36.5 C)  Orthostatics:  Laying 168/81 Sitting 162/88 Standing 1 minute 162/97 Standing 2 minute 160/98 Standing 3 minute 138/93 - light headed Standing 4 minute 157/96 Standing 5 minute 157/93 - light headedness improved  Gen: Well NAD Lungs: CTABL Nl WOB Heart: RRR  Exts: Non edematous BL  LE, warm and well perfused.    Assessment/Plan: Please see individual problem list.  Beth AlarEric Adriana Quinby, MD [facility] PGY-3

## 2014-07-25 NOTE — Assessment & Plan Note (Signed)
-   See HTN for plan

## 2014-07-25 NOTE — Assessment & Plan Note (Signed)
Above goal. No taking medications as prescribed. Appears to be orthostatic. Will stop amlodipine and decrease lisinopril to 20 mg daily. Patient and son wish to see a new physician in [place] given that is where they live. I recommended that they follow-up with the new physician or myself in the next 2-3 weeks for this issue.

## 2014-07-28 ENCOUNTER — Telehealth: Payer: Self-pay | Admitting: Family Medicine

## 2014-07-28 NOTE — Telephone Encounter (Signed)
Pt son needs a letter from Dr. Birdie SonsSonnenberg stating that he, Beth Harper, is the pt's primary caregiver so that she can continue to receive her SNAP benefits please call him as soon as it is ready pu / thanks sr

## 2014-07-28 NOTE — Telephone Encounter (Signed)
Will forward to md.  Jaki Steptoe,CMA  

## 2014-07-28 NOTE — Telephone Encounter (Signed)
Letter filled out and sent to admin staff to place at front for pick up.

## 2014-10-15 ENCOUNTER — Encounter: Payer: Self-pay | Admitting: Family Medicine

## 2014-10-15 ENCOUNTER — Ambulatory Visit (INDEPENDENT_AMBULATORY_CARE_PROVIDER_SITE_OTHER): Payer: Medicare Other | Admitting: Family Medicine

## 2014-10-15 VITALS — BP 149/78 | HR 64 | Temp 97.7°F | Ht 61.0 in | Wt 126.0 lb

## 2014-10-15 DIAGNOSIS — I10 Essential (primary) hypertension: Secondary | ICD-10-CM

## 2014-10-15 DIAGNOSIS — E039 Hypothyroidism, unspecified: Secondary | ICD-10-CM | POA: Diagnosis not present

## 2014-10-15 DIAGNOSIS — H5200 Hypermetropia, unspecified eye: Secondary | ICD-10-CM | POA: Diagnosis not present

## 2014-10-15 HISTORY — DX: Hypermetropia, unspecified eye: H52.00

## 2014-10-15 LAB — BASIC METABOLIC PANEL
BUN: 18 mg/dL (ref 6–23)
CALCIUM: 9.4 mg/dL (ref 8.4–10.5)
CHLORIDE: 103 meq/L (ref 96–112)
CO2: 29 meq/L (ref 19–32)
CREATININE: 1.1 mg/dL (ref 0.50–1.10)
Glucose, Bld: 89 mg/dL (ref 70–99)
POTASSIUM: 4.2 meq/L (ref 3.5–5.3)
SODIUM: 140 meq/L (ref 135–145)

## 2014-10-15 LAB — LIPID PANEL
CHOL/HDL RATIO: 3.1 ratio
Cholesterol: 276 mg/dL — ABNORMAL HIGH (ref 0–200)
HDL: 90 mg/dL (ref >=46)
LDL Cholesterol: 170 mg/dL — ABNORMAL HIGH (ref 0–99)
Triglycerides: 82 mg/dL (ref <150)
VLDL: 16 mg/dL (ref 0–40)

## 2014-10-15 NOTE — Progress Notes (Signed)
Patient is due for a screening mammogram.  Information on imaging center given to patient to call and schedule mammogram appointment.  Anaiah Mcmannis Dawn, CMA   

## 2014-10-15 NOTE — Assessment & Plan Note (Addendum)
Patient with apparent farsightedness likely related to aging. See vision screening for screen results. Advised patient to set up follow-up with her ophthalmologist.

## 2014-10-15 NOTE — Progress Notes (Signed)
Patient ID: Beth CastleDorothy Njoku, female   DOB: [birth year], 74 y.o.   MRN: [id number]  Marikay AlarEric Ayomide Zuleta, MD Phone: [phone]  Beth Harper is a 74 y.o. female who presents today for f/u.  Patient presents for physical exam. Only complaint today is that she has to hold reading material farther away to read it. No issues with distance. Has been going on "quite a while." No eye pain or redness. Has prescription glasses though has not seen optho recently.  Colonoscopy 2013 with diverticula, though no mention of polyps Pap smears last 2009 was normal. Reports no prior abnormal pap smears. Mammogram: 2013 normal  Exercises by walking and stretching.  Does not smoke, though is a prior smoker with quit date >15 years ago. Prior history of drinking alcohol, 1-2 beers on weekends.  No illicit drug use.  Patient Information Form: Screening and ROS  Do you feel safe in relationships? Yes.   PHQ-2:negative  Review of Symptoms  General:  Negative for unexplained weight loss, fever HEENT: positive for vision change (see above) CV:  Negative for chest pain, dyspnea, edema, palpitations Resp: Negative for cough, dyspnea GI: Negative for nausea, vomiting, diarrhea, constipation, abdominal pain GU: Negative for dysuria, incontinence, lumps in breasts MSK: Negative for muscle cramps or aches, joint pain or swelling Neuro: Negative for headaches Psych: Negative for depression   Physical Exam Filed Vitals:   10/15/14 1116  BP: 149/78  Pulse: 64  Temp: 97.7 F (36.5 C)    Gen: Well NAD HEENT: PERRL,  MMM Lungs: CTABL Nl WOB Heart: RRR, no murmur appreciated Skin: no rashes appreciated Abd: soft, NT, ND Breast: no lumps palpated, no skin defects GU: normal labia, normal vaginal mucosa, cervix with no lesions, normal bimanual exam Exts: Non edematous BL  LE, warm and well perfused.    Assessment/Plan: Please see individual problem list.   Marikay AlarEric Danene Montijo, MD [facility]  PGY-3

## 2014-10-15 NOTE — Patient Instructions (Addendum)
Nice to see you. Please contact your eye physician to set up a follow-up appointment. Please call and set up a mammogram. We will inform you of your lab results.

## 2014-10-16 ENCOUNTER — Telehealth: Payer: Self-pay | Admitting: Family Medicine

## 2014-10-16 LAB — TSH: TSH: 9.752 u[IU]/mL — AB (ref 0.350–4.500)

## 2014-10-16 NOTE — Telephone Encounter (Signed)
Called to speak to patient regarding lab work. First number was disconnected. Called cell number and reached the patients son who stated the patient was not at home at this time. Asked that he have the patient call back tomorrow to the office. He stated that he would. Patients cholesterol is elevated and she would benefit from going back on medication for this. Her TSH is also elevated and she would benefit from increasing the dose of this medication as she stated during her office visit that she is taking her thyroid medication daily.

## 2014-10-17 MED ORDER — PRAVASTATIN SODIUM 80 MG PO TABS: 80.0000 mg | ORAL_TABLET | Freq: Every day | ORAL | Status: DC

## 2014-10-17 MED ORDER — LEVOTHYROXINE SODIUM 25 MCG PO TABS: ORAL_TABLET | ORAL | Status: DC

## 2014-10-17 NOTE — Telephone Encounter (Signed)
Spoke with patient and sone regarding lab results. Son notes that the patient has not been taking the synthroid as it was making her nauseated. Advised that this is a possible side effect and that we would need to restart this medication though could start at a lower dose. Will also need to have patient start back on pravastatin. Advised to let us know if remains nauseated despite low dose of synthroid. Is to follow-up in 1-2 months.

## 2015-02-03 DIAGNOSIS — I10 Essential (primary) hypertension: Secondary | ICD-10-CM | POA: Diagnosis not present

## 2015-02-03 DIAGNOSIS — Z79899 Other long term (current) drug therapy: Secondary | ICD-10-CM | POA: Diagnosis not present

## 2015-02-03 DIAGNOSIS — K5900 Constipation, unspecified: Secondary | ICD-10-CM | POA: Diagnosis not present

## 2015-02-03 DIAGNOSIS — E780 Pure hypercholesterolemia: Secondary | ICD-10-CM | POA: Diagnosis not present

## 2015-03-08 DIAGNOSIS — R079 Chest pain, unspecified: Secondary | ICD-10-CM | POA: Diagnosis not present

## 2015-03-08 DIAGNOSIS — R0602 Shortness of breath: Secondary | ICD-10-CM | POA: Diagnosis not present

## 2015-06-14 DIAGNOSIS — F0390 Unspecified dementia without behavioral disturbance: Secondary | ICD-10-CM | POA: Diagnosis present

## 2015-06-14 DIAGNOSIS — I10 Essential (primary) hypertension: Secondary | ICD-10-CM | POA: Diagnosis present

## 2015-06-14 DIAGNOSIS — K922 Gastrointestinal hemorrhage, unspecified: Secondary | ICD-10-CM | POA: Diagnosis not present

## 2015-06-14 DIAGNOSIS — Z79899 Other long term (current) drug therapy: Secondary | ICD-10-CM | POA: Diagnosis not present

## 2015-06-14 DIAGNOSIS — D649 Anemia, unspecified: Secondary | ICD-10-CM | POA: Diagnosis not present

## 2015-06-14 DIAGNOSIS — D62 Acute posthemorrhagic anemia: Secondary | ICD-10-CM | POA: Diagnosis not present

## 2015-06-14 DIAGNOSIS — E039 Hypothyroidism, unspecified: Secondary | ICD-10-CM | POA: Diagnosis present

## 2015-06-14 DIAGNOSIS — K625 Hemorrhage of anus and rectum: Secondary | ICD-10-CM | POA: Diagnosis not present

## 2015-06-14 DIAGNOSIS — E7800 Pure hypercholesterolemia, unspecified: Secondary | ICD-10-CM | POA: Diagnosis present

## 2015-06-14 DIAGNOSIS — K5731 Diverticulosis of large intestine without perforation or abscess with bleeding: Secondary | ICD-10-CM | POA: Diagnosis not present

## 2015-06-14 DIAGNOSIS — Z23 Encounter for immunization: Secondary | ICD-10-CM | POA: Diagnosis not present

## 2015-06-14 DIAGNOSIS — K5790 Diverticulosis of intestine, part unspecified, without perforation or abscess without bleeding: Secondary | ICD-10-CM | POA: Diagnosis not present

## 2015-06-14 DIAGNOSIS — K921 Melena: Secondary | ICD-10-CM | POA: Diagnosis not present

## 2015-06-14 DIAGNOSIS — Z87891 Personal history of nicotine dependence: Secondary | ICD-10-CM | POA: Diagnosis not present

## 2015-06-14 DIAGNOSIS — Z7982 Long term (current) use of aspirin: Secondary | ICD-10-CM | POA: Diagnosis not present

## 2015-06-26 ENCOUNTER — Ambulatory Visit: Payer: Medicare Other | Admitting: Family Medicine

## 2015-07-03 ENCOUNTER — Ambulatory Visit: Payer: Medicare Other | Admitting: Family Medicine

## 2015-08-25 ENCOUNTER — Other Ambulatory Visit: Payer: Self-pay | Admitting: Family Medicine

## 2015-08-25 NOTE — Telephone Encounter (Signed)
Rx filled. Patient needs follow up appointment.  Beth Degreealeb M. Jimmey RalphParker, MD [facility] Resident PGY-2 08/25/2015 5:14 PM

## 2015-08-25 NOTE — Telephone Encounter (Signed)
Please advise on refill. No longer Dr. Birdie SonsSonnenberg patient

## 2015-08-26 NOTE — Telephone Encounter (Signed)
LM for son ok per DPR to call back and schedule an appt for patient regarding her blood pressure. Beth Harper,CMA

## 2015-10-11 ENCOUNTER — Other Ambulatory Visit: Payer: Self-pay | Admitting: Family Medicine

## 2015-10-13 NOTE — Telephone Encounter (Signed)
LM for son Vevelyn PatHorace Difonzo ok per [facility].  Asked him to call back and schedule an appt with pcp to discuss thyroid before medication runs out. Jazmin Hartsell,CMA

## 2015-10-13 NOTE — Telephone Encounter (Signed)
No longer a Dr. Sonnenberg patient 

## 2015-10-13 NOTE — Telephone Encounter (Signed)
Rx filled. Patient needs an appointment for further refills.  [name], MD [facility] Resident PGY-2 10/13/2015 10:49 AM

## 2015-11-11 ENCOUNTER — Encounter: Payer: Self-pay | Admitting: Family Medicine

## 2015-11-11 ENCOUNTER — Ambulatory Visit (INDEPENDENT_AMBULATORY_CARE_PROVIDER_SITE_OTHER): Payer: Medicare Other | Admitting: Family Medicine

## 2015-11-11 VITALS — BP 128/85 | HR 63 | Temp 98.2°F | Ht 61.0 in | Wt 100.0 lb

## 2015-11-11 DIAGNOSIS — R413 Other amnesia: Secondary | ICD-10-CM | POA: Diagnosis not present

## 2015-11-11 DIAGNOSIS — I10 Essential (primary) hypertension: Secondary | ICD-10-CM | POA: Diagnosis not present

## 2015-11-11 DIAGNOSIS — E785 Hyperlipidemia, unspecified: Secondary | ICD-10-CM | POA: Diagnosis not present

## 2015-11-11 DIAGNOSIS — R6889 Other general symptoms and signs: Secondary | ICD-10-CM

## 2015-11-11 DIAGNOSIS — L609 Nail disorder, unspecified: Secondary | ICD-10-CM

## 2015-11-11 DIAGNOSIS — L602 Onychogryphosis: Secondary | ICD-10-CM

## 2015-11-11 DIAGNOSIS — E039 Hypothyroidism, unspecified: Secondary | ICD-10-CM | POA: Diagnosis not present

## 2015-11-11 DIAGNOSIS — Z79899 Other long term (current) drug therapy: Secondary | ICD-10-CM

## 2015-11-11 HISTORY — DX: Onychogryphosis: L60.2

## 2015-11-11 LAB — CBC
HCT: 32.7 % — ABNORMAL LOW (ref 35.0–45.0)
HEMOGLOBIN: 10.9 g/dL — AB (ref 11.7–15.5)
MCH: 28.9 pg (ref 27.0–33.0)
MCHC: 33.3 g/dL (ref 32.0–36.0)
MCV: 86.7 fL (ref 80.0–100.0)
MPV: 11 fL (ref 7.5–12.5)
Platelets: 281 10*3/uL (ref 140–400)
RBC: 3.77 MIL/uL — ABNORMAL LOW (ref 3.80–5.10)
RDW: 15.5 % — AB (ref 11.0–15.0)
WBC: 3.6 10*3/uL — ABNORMAL LOW (ref 3.8–10.8)

## 2015-11-11 LAB — COMPLETE METABOLIC PANEL WITH GFR
ALBUMIN: 4.3 g/dL (ref 3.6–5.1)
ALK PHOS: 36 U/L (ref 33–130)
ALT: 13 U/L (ref 6–29)
AST: 18 U/L (ref 10–35)
BUN: 19 mg/dL (ref 7–25)
CALCIUM: 9.6 mg/dL (ref 8.6–10.4)
CHLORIDE: 103 mmol/L (ref 98–110)
CO2: 26 mmol/L (ref 20–31)
CREATININE: 1.21 mg/dL — AB (ref 0.60–0.93)
GFR, EST AFRICAN AMERICAN: 50 mL/min — AB (ref >=60)
GFR, Est Non African American: 43 mL/min — ABNORMAL LOW (ref >=60)
Glucose, Bld: 77 mg/dL (ref 65–99)
POTASSIUM: 4 mmol/L (ref 3.5–5.3)
Sodium: 138 mmol/L (ref 135–146)
Total Bilirubin: 0.8 mg/dL (ref 0.2–1.2)
Total Protein: 6.9 g/dL (ref 6.1–8.1)

## 2015-11-11 LAB — LIPID PANEL
Cholesterol: 226 mg/dL — ABNORMAL HIGH (ref 125–200)
HDL: 104 mg/dL (ref >=46)
LDL CALC: 109 mg/dL (ref <130)
Total CHOL/HDL Ratio: 2.2 Ratio (ref <=5.0)
Triglycerides: 65 mg/dL (ref <150)
VLDL: 13 mg/dL (ref <30)

## 2015-11-11 LAB — POCT GLYCOSYLATED HEMOGLOBIN (HGB A1C): Hemoglobin A1C: 5.5

## 2015-11-11 NOTE — Assessment & Plan Note (Signed)
Referral to podiatry placed

## 2015-11-11 NOTE — Patient Instructions (Signed)
We will not make any medication changes today.  We will check blood work today.  Please schedule an appointment with Dr McDiarmid for our geriatric clinic.  Please come back to see me in 3-6 months, or sooner if you need anything else.  Take care,  Dr Jimmey RalphParker

## 2015-11-11 NOTE — Assessment & Plan Note (Signed)
Well controlled today. Check CMP. Continue lisinopril. Consider discontinuing if continues to be well controlled.

## 2015-11-11 NOTE — Assessment & Plan Note (Signed)
Check lipid panel today. Hold pravastatin until lipid panel results.

## 2015-11-11 NOTE — Assessment & Plan Note (Signed)
Will check TSH and B12 today. Instructed patient and son to follow up in geriatric clinic.

## 2015-11-11 NOTE — Progress Notes (Signed)
    Subjective:  Beth Harper is a 75 y.o. female who [facility] with a chief complaint of hypertension follow up. History is provided by the patient and her son.  HPI:  Hypertension BP Readings from Last 3 Encounters:  11/11/15 128/85  10/15/14 149/78  07/24/14 159/87   Home BP monitoring-No Compliant with medications-yes, without side effects ROS-Denies any CP, HA, SOB, blurry vision, LE edema, transient weakness, orthopnea, PND.   HLD Recently stopped taking pravastatin. Says that her insurance company has not allowed her to get refills.   Forgetfulness  Patient recently admitted to the hospital with chest pain for ACS rule out. While there, patient started having sundowning and required constant re-orientation. Son says that he has noticed a significant decline in her memory over the past few months. Says that she on occasion thinks that her parents are still alive until her son has to remind her that they passed away several years ago. Patient has been living with her son for the last 4.5 years. Says that this was fine until recently when she has been asking to "move back home." Son then has to remind her that her home is not suitable to be lived in.   Overgrown Toenails Son also recently noticed that the patient's toenails have become overgrown. He is unable to trim them and is requesting a referral to podiatry.   ROS: Per HPI  PMH: Smoking history reviewed.    Objective:  Physical Exam: BP 128/85 mmHg  Pulse 63  Temp(Src) 98.2 F (36.8 C) (Oral)  Ht 5\' 1"  (1.549 m)  Wt 100 lb (45.36 kg)  BMI 18.90 kg/m2  SpO2 100%  Gen: NAD, resting comfortably CV: RRR with no murmurs appreciated Pulm: NWOB, CTAB with no crackles, wheezes, or rhonchi GI: Normal bowel sounds present. Soft, Nontender, Nondistended. MSK: Significant overgrown toenails bilaterally. No signs of skin breakdown or infection.  Skin: warm, dry Neuro: grossly normal, moves all  extremities Psych: Normal affect and thought content  Assessment/Plan:  HYPERTENSION, BENIGN SYSTEMIC Well controlled today. Check CMP. Continue lisinopril. Consider discontinuing if continues to be well controlled.   Memory change Will check TSH and B12 today. Instructed patient and son to follow up in geriatric clinic.   Overgrown toenails Referral to podiatry placed.   Hyperlipidemia Check lipid panel today. Hold pravastatin until lipid panel results.  [name], MD [facility] Resident PGY-2 11/11/2015 5:06 PM

## 2015-11-12 ENCOUNTER — Encounter: Payer: Self-pay | Admitting: Family Medicine

## 2015-11-12 LAB — VITAMIN B12: VITAMIN B 12: 412 pg/mL (ref 200–1100)

## 2015-11-12 LAB — TSH: TSH: 3.53 m[IU]/L

## 2015-11-18 ENCOUNTER — Ambulatory Visit (INDEPENDENT_AMBULATORY_CARE_PROVIDER_SITE_OTHER): Payer: Medicare Other | Admitting: Sports Medicine

## 2015-11-18 ENCOUNTER — Encounter: Payer: Self-pay | Admitting: Sports Medicine

## 2015-11-18 DIAGNOSIS — L853 Xerosis cutis: Secondary | ICD-10-CM

## 2015-11-18 DIAGNOSIS — M79676 Pain in unspecified toe(s): Secondary | ICD-10-CM | POA: Diagnosis not present

## 2015-11-18 DIAGNOSIS — B351 Tinea unguium: Secondary | ICD-10-CM

## 2015-11-18 NOTE — Progress Notes (Signed)
Patient ID: Beth Harper Argote, female   DOB: 1940-06-08, 75 y.o.   MRN: [id number] Subjective: Beth Harper Heppler is a 75 y.o. female patient seen today in office with complaint of painful thickened and elongated toenails; unable to trim. Patient denies history of Diabetes, Neuropathy, or Vascular disease. Patient is assisted by son who reports mom has memory loss and hasn't had care for her feet in some time. States that his mom has moved in with him for about 4 years now and he has been her primary caregiver. Patient has no other pedal complaints at this time.   Patient Active Problem List   Diagnosis Date Noted  . Hyperlipidemia 11/11/2015  . Overgrown toenails 11/11/2015  . 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 Orthostatic hypotension 12/18/2013  . Urge incontinence 12/31/2012  . Skin lesion 12/31/2012  . Vitamin B12 deficiency 06/20/2012  . Iron deficiency anemia due to chronic blood loss 06/14/2012  . Memory change 04/09/2012  . Physical deconditioning 03/16/2012  . History of GI diverticular bleed 02/29/2012  . Weight loss 08/29/2011  . 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 GERD 03/04/2009  . HYPOTHYROIDISM, UNSPECIFIED 07/13/2006  . HYPERLIPIDEMIA 07/13/2006  . HYPERTENSION, BENIGN SYSTEMIC 07/13/2006    Current Outpatient Prescriptions on File Prior to Visit  Medication Sig Dispense Refill  . acetaminophen (TYLENOL) 500 MG tablet Take 1,000 mg by mouth 2 (two) times daily. For pain    . Calcium Carbonate-Vitamin D (CALCIUM-VITAMIN D) 500-200 MG-UNIT per tablet Take 1 tablet by mouth 2 (two) times daily with a meal. 60 tablet 11  . cyanocobalamin ([employer] VITAMIN B12) 1000 MCG tablet One tablet twice daily for two weeks, then one tablet daily    . levothyroxine (SYNTHROID, LEVOTHROID) 25 MCG tablet TAKE ONE TABLET BY MOUTH ONCE DAILY 30 tablet 0  . lisinopril (PRINIVIL,ZESTRIL) 20 MG tablet TAKE ONE TABLET BY MOUTH ONCE DAILY 90 tablet 0   No current facility-administered medications on file prior  to visit.    No Known Allergies  Objective: Physical Exam  General: Well developed, nourished, no acute distress, awake, alert and oriented x 3  Vascular: Dorsalis pedis artery 1/4 bilateral, Posterior tibial artery 1/4 bilateral, skin temperature warm to warm proximal to distal bilateral lower extremities, no varicosities, Scant pedal hair present bilateral.  Neurological: Gross sensation present via light touch bilateral.   Dermatological: Skin is warm, dry, and supple bilateral, Nails 1-10 are tender, long, thick, and discolored with moderate subungal debris, no webspace macerations present bilateral, no open lesions present bilateral, no callus/corns/hyperkeratotic tissue present bilateral. Dry skin bilateral. No signs of infection bilateral.  Musculoskeletal: No symptomatic boney deformities noted bilateral. Muscular strength within normal limits without painon range of motion. No pain with calf compression bilateral.  Assessment and Plan:  Problem List Items Addressed This Visit    None    Visit Diagnoses    Pain due to onychomycosis of toenail    -  Primary    Dry skin           -Examined patient.  -Discussed treatment options for painful mycotic nails. -Mechanically debrided and reduced mycotic nails with sterile nail nipper and dremel nail file without incident. -Recommend daily skin emollients for dry skin -Recommend good supportive shoes daily -Patient to return in 3 months for follow up evaluation or sooner if symptoms worsen.  Asencion Islamitorya Rayley Gao, DPM

## 2015-11-19 ENCOUNTER — Ambulatory Visit (INDEPENDENT_AMBULATORY_CARE_PROVIDER_SITE_OTHER): Payer: Medicare Other | Admitting: Family Medicine

## 2015-11-19 ENCOUNTER — Encounter: Payer: Self-pay | Admitting: Licensed Clinical Social Worker

## 2015-11-19 ENCOUNTER — Encounter: Payer: Self-pay | Admitting: Family Medicine

## 2015-11-19 VITALS — BP 137/67 | HR 62 | Temp 97.9°F | Ht 61.0 in | Wt 100.0 lb

## 2015-11-19 DIAGNOSIS — H547 Unspecified visual loss: Secondary | ICD-10-CM

## 2015-11-19 DIAGNOSIS — F03918 Unspecified dementia, unspecified severity, with other behavioral disturbance: Secondary | ICD-10-CM | POA: Insufficient documentation

## 2015-11-19 DIAGNOSIS — R4182 Altered mental status, unspecified: Secondary | ICD-10-CM | POA: Diagnosis not present

## 2015-11-19 DIAGNOSIS — F0391 Unspecified dementia with behavioral disturbance: Secondary | ICD-10-CM | POA: Insufficient documentation

## 2015-11-19 DIAGNOSIS — Z114 Encounter for screening for human immunodeficiency virus [HIV]: Secondary | ICD-10-CM | POA: Diagnosis not present

## 2015-11-19 DIAGNOSIS — R413 Other amnesia: Secondary | ICD-10-CM | POA: Diagnosis not present

## 2015-11-19 DIAGNOSIS — E049 Nontoxic goiter, unspecified: Secondary | ICD-10-CM | POA: Diagnosis not present

## 2015-11-19 DIAGNOSIS — I700 Atherosclerosis of aorta: Secondary | ICD-10-CM | POA: Diagnosis not present

## 2015-11-19 NOTE — Progress Notes (Addendum)
Patient ID: [name], female   DOB: Jul 25, 1940, 75 y.o.   MRN: [id number] Social Work consult from Dr. McDiarmid while patient is in the office to provide dementia support resources to patient's son. Patient lives with her son in [place] for the past four years and her 77 year old adopted daughter.  Son Beth Harper is her primary care taker and POA.  States biggest concern is patient wondering out of the house.  DSS has been to the house to talk with him about concerns of her wonder from home, no open APS at this time..  The following was discussed, educational material on dementia, guardianship, long-term care options, other stressors and additional community/ family support. Patient's son has agreed to:  1. Call the Department of Social Services to talk to a Art therapistGuardianship Social worker  2. Call [facility] Adult Day Care and Respite  3. Read book to educate self on Dementia  4. Call  [place] DSS and make a new Medicaid for patient's adopted minor daughter  5. Call [employer] for information on long-term care 6. Call [employer] in [place] about pension 7. Explore possibility of door alarms   Sammuel Hineseborah Moore, LCSW Licensed Clinical Social Worker [facility]   [phone] 4:03 PM

## 2015-11-19 NOTE — Patient Instructions (Addendum)
Beth Harper has a moderate stage dementia.   I agree with the plan for her son to seek Guardianship in order to be able to keep his mother safe.   Needs-Driven Behavioral interventions  Focus on the person, not the task.      Pause to assess the person and the situation.      Break tasks into steps, allowing the person to do what he or she can do Individually.    Respond to the patient's emotions; don't argue logically.       Use the patient's agenda.    Slow down; follow the patient's lead.    Redirect the patient with a positive approach.     If things are not going well, leave and try again later.    Dementia Dementia is a general term for problems with brain function. A person with dementia has memory loss and a hard time with at least one other brain function such as thinking, speaking, or problem solving. Dementia can affect social functioning, how you do your job, your mood, or your personality. The changes may be hidden for a long time. The earliest forms of this disease are usually not detected by family or friends. Dementia can be:  Irreversible.  Potentially reversible.  Partially reversible.  Progressive. This means it can get worse over time. CAUSES  Irreversible dementia causes may include:  Degeneration of brain cells (Alzheimer disease or Lewy body dementia).  Multiple small strokes (vascular dementia).  Infection (chronic meningitis or Creutzfeldt-Jakob disease).  Frontotemporal dementia. This affects younger people, age 77 to 5870, compared to those who have Alzheimer disease.  Dementia associated with other disorders like Parkinson disease, Huntington disease, or HIV-associated dementia. Potentially or partially reversible dementia causes may include:  Medicines.  Metabolic causes such as excessive alcohol intake, vitamin B12 deficiency, or thyroid disease.  Masses or pressure in the brain such as a tumor, blood clot, or hydrocephalus. SIGNS AND  SYMPTOMS  Symptoms are often hard to detect. Family members or coworkers may not notice them early in the disease process. Different people with dementia may have different symptoms. Symptoms can include:  A hard time with memory, especially recent memory. Long-term memory may not be impaired.  Asking the same question multiple times or forgetting something someone just said.  A hard time speaking your thoughts or finding certain words.  A hard time solving problems or performing familiar tasks (such as how to use a telephone).  Sudden changes in mood.  Changes in personality, especially increasing moodiness or mistrust.  Depression.  A hard time understanding complex ideas that were never a problem in the past. DIAGNOSIS  There are no specific tests for dementia.   Your health care provider may recommend a thorough evaluation. This is because some forms of dementia can be reversible. The evaluation will likely include a physical exam and getting a detailed history from you and a family member. The history often gives the best clues and suggestions for a diagnosis.  Memory testing may be done. A detailed brain function evaluation called neuropsychologic testing may be helpful.  Lab tests and brain imaging (such as a CT scan or MRI scan) are sometimes important.  Sometimes observation and re-evaluation over time is very helpful. TREATMENT  Treatment depends on the cause.   If the problem is a vitamin deficiency, it may be helped or cured with supplements.  For dementias such as Alzheimer disease, medicines are available to stabilize or slow the course of  the disease. There are no cures for this type of dementia.  Your health care provider can help direct you to groups, organizations, and other health care providers to help with decisions in the care of you or your loved one. HOME CARE INSTRUCTIONS The care of individuals with dementia is varied and dependent upon the progression  of the dementia. The following suggestions are intended for the person living with, or caring for, the person with dementia.  Create a safe environment.  Remove the locks on bathroom doors to prevent the person from accidentally locking himself or herself in.  Use childproof latches on kitchen cabinets and any place where cleaning supplies, chemicals, or alcohol are kept.  Use childproof covers in unused electrical outlets.  Install childproof devices to keep doors and windows secured.  Remove stove knobs or install safety knobs and an automatic shut-off on the stove.  Lower the temperature on water heaters.  Label medicines and keep them locked up.  Secure knives, lighters, matches, power tools, and guns, and keep these items out of reach.  Keep the house free from clutter. Remove rugs or anything that might contribute to a fall.  Remove objects that might break and hurt the person.  Make sure lighting is good, both inside and outside.  Install grab rails as needed.  Use a monitoring device to alert you to falls or other needs for help.  Reduce confusion.  Keep familiar objects and people around.  Use night lights or dim lights at night.  Label items or areas.  Use reminders, notes, or directions for daily activities or tasks.  Keep a simple, consistent routine for waking, meals, bathing, dressing, and bedtime.  Create a calm, quiet environment.  Place large clocks and calendars prominently.  Display emergency numbers and home address near all telephones.  Use cues to establish different times of the day. An example is to open curtains to let the natural light in during the day.   Use effective communication.  Choose simple words and short sentences.  Use a gentle, calm tone of voice.  Be careful not to interrupt.  If the person is struggling to find a word or communicate a thought, try to provide the word or thought.  Ask one question at a time. Allow  the person ample time to answer questions. Repeat the question again if the person does not respond.  Reduce nighttime restlessness.  Provide a comfortable bed.  Have a consistent nighttime routine.  Ensure a regular walking or physical activity schedule. Involve the person in daily activities as much as possible.  Limit napping during the day.  Limit caffeine.  Attend social events that stimulate rather than overwhelm the senses.  Encourage good nutrition and hydration.  Reduce distractions during meal times and snacks.  Avoid foods that are too hot or too cold.  Monitor chewing and swallowing ability.  Continue with routine vision, hearing, dental, and medical screenings.  Give medicines only as directed by the health care provider.  Monitor driving abilities. Do not allow the person to drive when safe driving is no longer possible.  Register with an identification program which could provide location assistance in the event of a missing person situation. SEEK MEDICAL CARE IF:   New behavioral problems start such as moodiness, aggressiveness, or seeing things that are not there (hallucinations).  Any new problem with brain function happens. This includes problems with balance, speech, or falling a lot.  Problems with swallowing develop.  Any symptoms  of other illness happen. Small changes or worsening in any aspect of brain function can be a sign that the illness is getting worse. It can also be a sign of another medical illness such as infection. Seeing a health care provider right away is important. SEEK IMMEDIATE MEDICAL CARE IF:   A fever develops.  New or worsened confusion develops.  New or worsened sleepiness develops.  Staying awake becomes hard to do.   This information is not intended to replace advice given to you by your health care provider. Make sure you discuss any questions you have with your health care provider.   Document Released: 10/26/2000  Document Revised: 05/23/2014 Document Reviewed: 09/27/2010 Elsevier Interactive Patient Education [employer]

## 2015-11-20 ENCOUNTER — Encounter: Payer: Self-pay | Admitting: Family Medicine

## 2015-11-20 DIAGNOSIS — I700 Atherosclerosis of aorta: Secondary | ICD-10-CM | POA: Insufficient documentation

## 2015-11-20 DIAGNOSIS — E049 Nontoxic goiter, unspecified: Secondary | ICD-10-CM

## 2015-11-20 DIAGNOSIS — H547 Unspecified visual loss: Secondary | ICD-10-CM | POA: Insufficient documentation

## 2015-11-20 HISTORY — DX: Nontoxic goiter, unspecified: E04.9

## 2015-11-20 LAB — RPR

## 2015-11-20 LAB — HIV ANTIBODY (ROUTINE TESTING W REFLEX): HIV 1&2 Ab, 4th Generation: NONREACTIVE

## 2015-11-20 NOTE — Assessment & Plan Note (Signed)
Secondary risk therapies

## 2015-11-20 NOTE — Assessment & Plan Note (Addendum)
   Visual Acuity Screening   Right eye Left eye Both eyes  Without correction: 20/70 20/70 20/70   With correction:     Comments: Handheld Snellen Chart - Performed without glasses  Patient has not had ophthalmologic exam in many years per son.  Recommend that patient have one within next few months as treatments that improve vision are associate with improved quality of life.

## 2015-11-20 NOTE — Assessment & Plan Note (Signed)
Secondary risk therpies

## 2015-11-20 NOTE — Assessment & Plan Note (Addendum)
New Diagnosis of Dementia  Addendum: Head CT (11/26/15) without CM showed atrophy and small vessel disease only Evidence of progression of cognitive and functional impairments from 2012 report that has shown gradual progressive worsening per report along with substantial cognitive impairment in multiple cognitive domains including language, attention, memory and orientation.  Moderate stage of Dementia with resultant impaired abilities instrumental activities of daily living finances, housekeeping, shopping, telephoning and travel outside home and preserved basic activities of daily living  Screening for depression did not reach threshold for further diagnostic workup Behavioral problem of wandering outside the son's home requiring police to locate and return patient home.   Counseled patient and family regarding the diagnosis of dementia and progressive nature of the neurodegenerative disorder.  Provided packet of materials assembled for patients and families with more information about dementia and resources to assist with coping with impairments and behaviors, particular the concept of Needs-Driven Behaviors and interventions to reduce wandering outside the home and improve safety should patient leave the home unaccompanied.   I recommended to the patient's son, Beth Harper, that he or someone else familiar with his mother seek legal guardianship as placement of patient into an Assistive Living Facility with a memory unit to ensure her safety may be necessary in the future.  Currently, the patient appears resistant to this possible change in living arrangements and a guardianship would be necessary to for this disposition.   [employer] as a particularly good resource for support of caregivers.  Recommend social services consultation for community resources including the [facility], adults day care programs, caregiver support. Beth Sammuel HinesDeborah Moore LCSW, provided  contact information for [facility]ians[facility] advisor.    Recommended caregivers address advanced planning for patient's financial estate, future legal questions of competency and desired medical care the patient desires currently for resuscitation and for future care, including health care power of attorney agent, should patient become severely incapacitated.  I recommend that Beth Harper provide a copy of the patient's HCPOA and and other Advanced Directives to our office so they may be scanned into his mother's health record.   RTC 6 weeks to follow up with Dr Jimmey RalphParker.    Neurology consult was not recommended.  Patient and caregiver(s) questions were invited and answered as best as possible.

## 2015-11-20 NOTE — Progress Notes (Signed)
Patient ID: Beth Harper, female   DOB: [birth year], 75 y.o.   MRN: [id number] [facility]:   Patient is accompanied by: son Primary caregiver: son Patient's lives with their son in [place], [place] Patient information was obtained from patient, relative(s) and past medical records. History/Exam limitations: impaired cognition. Primary Care Provider: Jacquiline Doe, MD Referring provider: Jacquiline Doe, MD Reason for referral:  Chief Complaint  Patient presents with  . Altered Mental Status   History Chief Complaint  Patient presents with  . Altered Mental Status   Cognitive impairment concern What problems with thinking are there? memory loss and difficulty with complex reasoning. Stops talking in mid-sentence as she is looking for a word or has lost her train of thought.   When were the changes first noticed? Around 2012  Did this change occur abruptly or gradually? gradual  How have the changes progressed since then? gradually worsening  Does their level of alertness change throughout the day? no  Is their speech disorganized, rambling? no, but is limited in content with focused topics to which she frequently returns  Has there been any tremors or abnormal movements? no  Have they had in hallucinations or delusions: no, illusions have been present with misinterpretion of visual images  Have they appeared more anxious or sad lately? yes, both anxious and sad  Do they still have interests or activities they enjor doing? yes, she would like to return to going to her home church in [place] where she had been a member of the choir for many years  How has their appetite been lately? show no change  How has their sleep been lately? nightime awakenings with her leaving the home and getting lost. Cat naps during day  Problem behaviors: suspiciousness   Compared to 5 to 10 years ago, how is the patient at:  Remembering things about family and friends e.g.  names, occupations, birthdays, addresses? are worsening.  Common for her to mistake a familiar person's identity, like her teen-age granddaughter who she raised  Remembering things that have happened recently? are worsening Recalling conversations a few days later? are worsening  Remembering what day and month it is? are worsening  Remembering where things are usually kept? are worsening.  Putting familiar household items in different locations then forgetting where the items are located.   Losing things? are worsening  Learning to use a new gadget or machine around the house? Uncertain.  She is unable to operate mobile phone to dial out  Learning new things in general? are worsening  Following a story in a book or on TV? show no change  Handling money for shopping? are worsening  Handling financial matters, e.g. their pension, dealing with the bank? are worsening  Able to cope with unexpected events? It takes longer for her to adjust to changes in routine and environment  Getting lost? are worsening.  Son reports patient leaving his home on foot unaccompanied then getting lost. Multiple times the police have had to return Beth Harper to her son's home.  [employer] DSS has been notified by police of their concern for the patient's safety.  He son says that his mother has been found in locations that would have required her to walk on the side of a busy, major highway.   Asking same questions repeatedly or telling the same story repeatedly to the same person(s)? yes, particularly about wanting to leave her son's home to return to her own home.  Her son  said that he believes his mother is trying to walk home when she is found in the community by the police   Patient reported to her son, after the fact, that when she had been driving that she had driven the wrong way on a one-way street.   Her son reports that before he took her car keys away, his mother was getting lost in while driving  routes that she had been driving for many years.   Beth Harper said, and his mother agreed, that she gets agitated with him when she starts talking about wanting to drive again.    Outpatient Encounter Prescriptions as of 11/19/2015  Medication Sig  . acetaminophen (TYLENOL) 500 MG tablet Take 1,000 mg by mouth 2 (two) times daily. For pain  . cyanocobalamin ([facility] VITAMIN B12) 1000 MCG tablet One tablet twice daily for two weeks, then one tablet daily  . levothyroxine (SYNTHROID, LEVOTHROID) 25 MCG tablet TAKE ONE TABLET BY MOUTH ONCE DAILY  . lisinopril (PRINIVIL,ZESTRIL) 20 MG tablet TAKE ONE TABLET BY MOUTH ONCE DAILY  . Calcium Carbonate-Vitamin D (CALCIUM-VITAMIN D) 500-200 MG-UNIT per tablet Take 1 tablet by mouth 2 (two) times daily with a meal.   No facility-administered encounter medications on file as of 11/19/2015.   History Patient Active Problem List   Diagnosis Date Noted  . Goiter   . Aortic arch atherosclerosis (HCC)   . Abdominal aortic atherosclerosis (HCC)   . 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 Urge incontinence 12/31/2012  . Vitamin B12 deficiency 06/20/2012  . Iron deficiency anemia due to chronic blood loss 06/14/2012  . Memory change 04/09/2012  . Weight loss 08/29/2011  . GERD 03/04/2009  . HYPOTHYROIDISM, UNSPECIFIED 07/13/2006  . HYPERLIPIDEMIA 07/13/2006  . HYPERTENSION, BENIGN SYSTEMIC 07/13/2006   Past Medical History  Diagnosis Date  . Hypertension   . Diverticula, small intestine   . Hyperlipidemia   . Hypothyroidism   . HYPOTHYROIDISM, UNSPECIFIED 07/13/2006    Qualifier: Diagnosis of  By: Haydee Salter    . HYPERLIPIDEMIA 07/13/2006    Qualifier: Diagnosis of  By: Haydee Salter    . HYPERTENSION, BENIGN SYSTEMIC 07/13/2006    Qualifier: Diagnosis of  By: Haydee Salter    . GERD 03/04/2009    Qualifier: Diagnosis of  By: Constance Goltz MD, Molli Hazard    . ROTATOR CUFF SYNDROME 10/16/2009    Qualifier: Diagnosis of  By: Jennette Kettle MD, Huntley Dec    . Weight loss 08/29/2011  .  History of GI diverticular bleed 02/29/2012  . Physical deconditioning 03/16/2012    Received fax report that patient no [facility]12.    . Memory change 04/09/2012  . Iron deficiency anemia due to chronic blood loss 06/14/2012  . Vitamin B12 deficiency 06/20/2012    Equivocal B12 (between 200-300) With elevated MMA.  Replacing with B12 1000 mcg BID x 2 week, then once daily.    . Urge incontinence 12/31/2012  . Skin lesion 12/31/2012  . Orthostatic hypotension 12/18/2013  . 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 Overgrown toenails 11/11/2015  . Goiter 11/20/2015  . Aortic arch atherosclerosis (HCC)     Found on CT   Past Surgical History  Procedure Laterality Date  . Colon surgery  09/22/2000    left hemicolectomy -Dr Magnus Ivan  . Appendectomy  09/22/2000  . Colonoscopy  03/01/2012    Procedure: COLONOSCOPY;  Surgeon: Graylin Shiver, MD;  Location: [facility] ENDOSCOPY;  Service: Endoscopy;  Laterality: N/A;  Pat  . Colostomy takedown  2002    Dr Magnus IvanBlackman   Family History  Problem Relation Age of Onset  . Dementia Mother    Social History   Social History  . Marital Status: Widowed    Spouse Name: N/A  . Number of Children: N/A  . Years of Education: N/A   Social History Main Topics  . Smoking status: Former Smoker    Quit date: 05/16/2010  . Smokeless tobacco: Never Used  . Alcohol Use: Yes     Comment: occassional  . Drug Use: No  . Sexual Activity: No   Other Topics Concern  . None   Social History Narrative   Live with her Son Beth CarnesHorace in [place].      Beth CarnesHorace reports having documentation that he is the agent for his mother's financial and healthcare Power-of-Attorney (11/19/15)      Raising her grand daughter Tobie Poet(Aynana Born in 2002).  Horace related that the patient has legal guardian ship of the patient's granddaughter, Tobie Poetynana.     Family History of Dementia: Yes - patient's mother  Basic Activities of Daily Living  ADLs Independent Needs Assistance Dependent   Bathing x    Dressing x    Ambulation x    Toileting x    Eating x     Instrumental Activities of Daily Living IADL Independent Needs Assistance Dependent  Cooking   x  Housework x    Manage Medications   x  Manage the telephone  x   Shopping for food, clothes, Meds, etc   x  Use transportation   x  Manage Finances   x    Caregivers in home: son  Caregiver Burdens: Significant.  Her son, Beth CarnesHorace, relates having to provide 24/7 supervision of his mother to ensure she receives appropriate care and that her safety is assured.   FALLS in last five office visits:  Fall Risk  11/19/2015 11/11/2015 10/15/2014 01/14/2014 12/31/2012  Falls in the past year? No No Yes No Yes  Number falls in past yr: - - 1 - 1  Injury with Fall? - - No - No    Health Maintenance reviewed: Immunization History  Administered Date(s) Administered  . Influenza Whole 03/28/2007, 02/08/2008, 03/04/2009  . Pneumococcal Polysaccharide-23 02/14/2000, 05/13/2010  . Td 03/17/1999, 03/04/2009   Health Maintenance Topics with due status: Overdue     Topic Date Due   ZOSTAVAX 06/04/1998   PNA vac Low Risk Adult 05/14/2011    Diet: Regular Nutritional supplements: none  Geriatric Syndromes ROS: Constipation no ,   Incontinence yes  Dizziness no   Syncope no   Skin problems no   Visual Impairment yes   Hearing impairment no  Eating impairment no  Impaired Memory or Cognition yes   Behavioral problems yes, wandering and occasional verbal aggression directed at her caretaker.  She does not refuse assistance, though.    Sleep problems yes   Weight loss: no per son, though review of our office records show a loss 26 lbs (20% loss) in last one year.      Vital Signs Weight: 100 lb (45.36 kg) Body mass index is 18.9 kg/(m^2). Estimated Creatinine Clearance: 27.9 mL/min (by C-G formula based on Cr of 1.21). Body surface area is 1.40 meters squared. Filed Vitals:   11/19/15 1410  BP: 137/67  Pulse: 62   Temp: 97.9 F (36.6 C)  TempSrc: Oral  Height: 5\' 1"  (1.549 m)  Weight: 100 lb (45.36 kg)   Wt Readings from Last 3 Encounters:  11/19/15 100 lb (45.36 kg)  11/11/15 100 lb (45.36 kg)  10/15/14 126 lb (57.153 kg)    Hearing Screening   Method: Audiometry   125Hz  250Hz  500Hz  1000Hz  2000Hz  4000Hz  8000Hz   Right ear:   0 0 40 0   Left ear:   0 0 0 0     Visual Acuity Screening   Right eye Left eye Both eyes  Without correction: 20/70 20/70 20/70   With correction:     Comments: Handheld Snellen Chart - Performed without glasses   Physical Examination:  VS reviewed GEN: Alert, Cooperative, Groomed, NAD  Mini-Mental State Examination or Montreal Cognitive Assessment:  Patient did  require additional cues or prompts to complete tasks. Patient was cooperative and attentive to testing tasks Patient did  appear motivated to perform well  MMSE - Mini Mental State Exam 06/14/2012  Orientation to time 4  Orientation to Place 5  Registration 3  Attention/ Calculation 4  Recall 0  Language- name 2 objects 2  Language- repeat 1  Language- follow 3 step command 3  Language- read & follow direction 1  Write a sentence 1  Copy design 1  Total score 25        Montreal Cognitive Assessment BLIND 11/19/2015  Visuospatial/ Executive (0/5) 0  Not assessed because of impaired vision  Naming (0/3) 0  Not assessed because of impaired vision  Attention: Read list of digits (0/2) 0  Attention: Read list of letters (0/1) 1  Attention: Serial 7 subtraction starting at 100 (0/3) 0  Language: Repeat phrase (0/2) 0  Language : Fluency (0/1) 0  Abstraction (0/2) 0  Delayed Recall (0/5) 0  Orientation (0/6) 2  Total 3  Adjusted Score (based on education) 4  Normal score on MOCa-BLIND <=18 out of 22    Geriatric Depression Scale:  4 / 13 (patient had difficulty answering two of the Yes/No questions, with her starting a tangential discussion that the interviewer was unable to re-direct back to  the questions.   Labs  Lab Results  Component Value Date   VITAMINB12 412 11/11/2015    Lab Results  Component Value Date   FOLATE 11.0 04/09/2012    Lab Results  Component Value Date   TSH 3.53 11/11/2015    No results found for: RPR    Chemistry      Component Value Date/Time   NA 138 11/11/2015 1436   K 4.0 11/11/2015 1436   CL 103 11/11/2015 1436   CO2 26 11/11/2015 1436   BUN 19 11/11/2015 1436   CREATININE 1.21* 11/11/2015 1436   CREATININE 1.11* 03/12/2012 0640      Component Value Date/Time   CALCIUM 9.6 11/11/2015 1436   ALKPHOS 36 11/11/2015 1436   AST 18 11/11/2015 1436   ALT 13 11/11/2015 1436   BILITOT 0.8 11/11/2015 1436       Lab Results  Component Value Date   HGBA1C 5.5 11/11/2015      Lab Results  Component Value Date   WBC 3.6* 11/11/2015   HGB 10.9* 11/11/2015   HCT 32.7* 11/11/2015   MCV 86.7 11/11/2015   PLT 281 11/11/2015    Results for orders placed or performed in visit on 11/19/15 (from the past 24 hour(s))  RPR   Collection Time: 11/19/15  3:59 PM  Result Value Ref Range   RPR Ser Ql NON REAC NON REAC  HIV antibody (with reflex)   Collection Time: 11/19/15  3:59 PM  Result Value Ref  Range   HIV 1&2 Ab, 4th Generation NONREACTIVE NONREACTIVE    Imaging Head CT: none found in [facility] EMR   Brain MRI: none found in [facility] EMR   Assessment and Plan: Problem List Items Addressed This Visit      Unprioritized   Memory change - Primary   Relevant Orders   RPR (Completed)   HIV antibody (with reflex) (Completed)   CT Head Wo Contrast   Goiter   Aortic arch atherosclerosis (HCC)   Abdominal aortic atherosclerosis (HCC) (Chronic)    Other Visit Diagnoses    Altered mental status, unspecified altered mental status type        Relevant Orders    HIV antibody (with reflex) (Completed)    Screening for HIV (human immunodeficiency virus)        Relevant Orders    HIV antibody (with reflex) (Completed)        Personal Strengths Motivation for treatment/growth Religious Affiliation Supportive family/friends  Support System Strengths Supportive Relationships, Spirituality and Hopefulness  Advanced Directives: Code Status: Full code Status ot discussed during this consultation.    Montreal Cognitive Assessment  11/19/2015  Visuospatial/ Executive (0/5) 0  Naming (0/3) 0  Attention: Read list of digits (0/2) 0  Attention: Read list of letters (0/1) 1  Attention: Serial 7 subtraction starting at 100 (0/3) 0  Language: Repeat phrase (0/2) 0  Language : Fluency (0/1) 0  Abstraction (0/2) 0  Delayed Recall (0/5) 0  Orientation (0/6) 2  Total 3  Adjusted Score (based on education) 4  Contact: First and Last Name if other than the patient involved: Vevelyn PatHorace Halbur (son)   [phone] (home)    Patient to Follow up with Dr. Jacquiline Doealeb Parker or [facility]  in 6 week(s)  60 minutes face to face where spent in total with counseling / coordination of care took more than 50% of the total time. Counseling involved discussion of the multiple cognitive tests results with patient and her son. Discussion on diagnosis and natural history of Dementia,  the need and nature of the further testing to look for reversible causes.  Care was coordinated with our Social Worker, Beth Sammuel HinesDeborah Moore LCSW,  who also saw patient during his visit.  We discussed his case with Beth Christell ConstantMoore. .Marland Kitchen

## 2015-11-25 ENCOUNTER — Ambulatory Visit (HOSPITAL_COMMUNITY): Payer: Medicare Other

## 2015-11-26 ENCOUNTER — Ambulatory Visit (HOSPITAL_COMMUNITY)
Admission: RE | Admit: 2015-11-26 | Discharge: 2015-11-26 | Disposition: A | Discharge status: Home / Self Care | Payer: Medicare Other | Source: Ambulatory Visit | Attending: Family Medicine | Admitting: Family Medicine

## 2015-11-26 DIAGNOSIS — R413 Other amnesia: Secondary | ICD-10-CM | POA: Insufficient documentation

## 2015-11-26 DIAGNOSIS — G319 Degenerative disease of nervous system, unspecified: Secondary | ICD-10-CM | POA: Diagnosis not present

## 2015-11-26 DIAGNOSIS — F0391 Unspecified dementia with behavioral disturbance: Secondary | ICD-10-CM | POA: Diagnosis present

## 2015-11-26 DIAGNOSIS — I739 Peripheral vascular disease, unspecified: Secondary | ICD-10-CM | POA: Diagnosis not present

## 2015-12-13 ENCOUNTER — Other Ambulatory Visit: Payer: Self-pay | Admitting: Family Medicine

## 2015-12-14 NOTE — Telephone Encounter (Signed)
Rx filled.  Caleb M. Parker, MD [facility] Resident PGY-3 12/14/2015 1:54 PM   

## 2015-12-23 DIAGNOSIS — I10 Essential (primary) hypertension: Secondary | ICD-10-CM | POA: Diagnosis not present

## 2015-12-23 DIAGNOSIS — R079 Chest pain, unspecified: Secondary | ICD-10-CM | POA: Diagnosis not present

## 2015-12-23 DIAGNOSIS — R109 Unspecified abdominal pain: Secondary | ICD-10-CM | POA: Diagnosis not present

## 2015-12-23 DIAGNOSIS — E7800 Pure hypercholesterolemia, unspecified: Secondary | ICD-10-CM | POA: Diagnosis not present

## 2015-12-23 DIAGNOSIS — R0789 Other chest pain: Secondary | ICD-10-CM | POA: Diagnosis not present

## 2015-12-31 ENCOUNTER — Telehealth: Payer: Self-pay | Admitting: *Deleted

## 2015-12-31 DIAGNOSIS — F0391 Unspecified dementia with behavioral disturbance: Secondary | ICD-10-CM

## 2015-12-31 DIAGNOSIS — F03918 Unspecified dementia, unspecified severity, with other behavioral disturbance: Secondary | ICD-10-CM

## 2015-12-31 NOTE — Telephone Encounter (Signed)
Mitzi DavenportShelby, NP with [facility] called to request that patient has a psych evaluation for medical and medication mangament and due to her history of wondering and dementia.  Patient is currently trying to enroll in their program and patient has to be on some kind of medication before enrollment can be completed.  Their program is apart of [facility].  Please give her a call at [phone] with questions.  Clovis PuMartin, Zyah Gomm L, RN

## 2016-01-01 NOTE — Telephone Encounter (Signed)
Referral to psychiatry placed.  [name], MD [facility] Resident PGY-3 01/01/2016 8:48 AM

## 2016-01-04 NOTE — Telephone Encounter (Signed)
This referral has been sent to [facility] for scheduling.

## 2016-01-14 NOTE — Telephone Encounter (Signed)
Behavioral Health has been unable to contact patient to schedule her appt  due to phone being disconnected.

## 2016-01-22 ENCOUNTER — Other Ambulatory Visit: Payer: Self-pay | Admitting: Family Medicine

## 2016-01-22 NOTE — Telephone Encounter (Signed)
30 day supply given. Patient needs to schedule a follow up appointment.  [name], MD [facility] Resident PGY-3 01/22/2016 4:10 PM

## 2016-02-10 ENCOUNTER — Other Ambulatory Visit: Payer: Self-pay | Admitting: Family Medicine

## 2016-02-24 ENCOUNTER — Ambulatory Visit: Payer: Medicare Other | Admitting: Sports Medicine

## 2016-04-01 ENCOUNTER — Ambulatory Visit: Payer: Medicare Other | Admitting: Family Medicine

## 2016-05-02 ENCOUNTER — Ambulatory Visit: Payer: Medicare Other | Admitting: Family Medicine

## 2016-06-10 ENCOUNTER — Telehealth: Payer: Self-pay | Admitting: Family Medicine

## 2016-06-10 DIAGNOSIS — L602 Onychogryphosis: Secondary | ICD-10-CM

## 2016-06-10 NOTE — Telephone Encounter (Signed)
Will forward to MD. Helix Lafontaine,CMA  

## 2016-06-10 NOTE — Telephone Encounter (Signed)
Pt needs another referral to [facility] in [place] for routine food care. ep

## 2016-06-12 NOTE — Telephone Encounter (Signed)
Referral placed.  [name], MD [facility] Resident PGY-3 06/12/2016 7:01 PM

## 2016-06-13 ENCOUNTER — Telehealth: Payer: Self-pay | Admitting: Family Medicine

## 2016-06-13 NOTE — Telephone Encounter (Signed)
Son called on Friday and today asking if we have received a form [facility], its an [facility] in [place]. I didn't see anything Friday, so he said he would fax it again, nothing came. Son spoke with Melvenia BeamShari this morning and as of now still no form has come in. Just wanted to let you know. ep

## 2016-06-13 NOTE — Telephone Encounter (Signed)
Form has arrived and place in Dr. Lavone NeriParker's box. ep

## 2016-06-13 NOTE — Telephone Encounter (Signed)
Will forward to MD to make him aware. Annais Crafts,CMA  

## 2016-06-14 NOTE — Telephone Encounter (Signed)
Pt's son called again, form was given to Page. Son states [facility] has to have the form by tomorrow morning and to please call [facility] after fax has been sent. Thanks! ep

## 2016-06-14 NOTE — Telephone Encounter (Signed)
Another fax received and placed in providers box. Jazmin Hartsell,CMA

## 2016-06-15 NOTE — Telephone Encounter (Signed)
Form completed and given to Jazmin.  [name], MD [facility] Resident PGY-3 06/15/2016 9:07 AM

## 2016-06-15 NOTE — Telephone Encounter (Signed)
LM for son informing him that form was faxed and if there were any concerns that he should bring patient in for a follow up. Jazmin Hartsell,CMA

## 2016-06-16 ENCOUNTER — Telehealth: Payer: Self-pay | Admitting: Family Medicine

## 2016-06-16 ENCOUNTER — Ambulatory Visit (INDEPENDENT_AMBULATORY_CARE_PROVIDER_SITE_OTHER): Payer: Medicare Other | Admitting: Family Medicine

## 2016-06-16 ENCOUNTER — Encounter: Payer: Self-pay | Admitting: Family Medicine

## 2016-06-16 VITALS — BP 130/86 | HR 70 | Temp 97.8°F | Ht 61.0 in | Wt 102.6 lb

## 2016-06-16 DIAGNOSIS — M25511 Pain in right shoulder: Secondary | ICD-10-CM

## 2016-06-16 DIAGNOSIS — M67911 Unspecified disorder of synovium and tendon, right shoulder: Secondary | ICD-10-CM

## 2016-06-16 DIAGNOSIS — F0391 Unspecified dementia with behavioral disturbance: Secondary | ICD-10-CM

## 2016-06-16 MED ORDER — METHYLPREDNISOLONE ACETATE 40 MG/ML IJ SUSP
40.0000 mg | Freq: Once | INTRAMUSCULAR | Status: AC
Start: 2016-06-16 — End: 2016-06-16
  Administered 2016-06-16: 40 mg via INTRAMUSCULAR

## 2016-06-16 MED ORDER — DONEPEZIL HCL 5 MG PO TABS: 5.0000 mg | ORAL_TABLET | Freq: Every day | ORAL | 1 refills | Status: AC

## 2016-06-16 NOTE — Patient Instructions (Signed)
Ms Beth Harper has rotator cuff pain. The injection today will help. It may worsen over the next day.  Do the exercises.  We will start donepezil.  Come back to see me soon for a regular visit.  Take care,  Dr Jimmey RalphParker

## 2016-06-16 NOTE — Telephone Encounter (Signed)
Will forward to MD. Catelynn Sparger,CMA  

## 2016-06-16 NOTE — Telephone Encounter (Signed)
Patient 's son asking for letter stating he is primary care giver for patient.   Needs it to apply for food stamps benefits Please follow up patient Ph: [phone]

## 2016-06-16 NOTE — Telephone Encounter (Signed)
Letter created and forwarded to blue team.  [name], MD [facility] Resident PGY-3 06/16/2016 5:20 PM

## 2016-06-16 NOTE — Assessment & Plan Note (Signed)
Pain most likely related to rotator cuff given prior history. Subacromial injection performed today - see above note. Discussed strengthening exercises. Follow up in 4-6 weeks. Use ibuprofen or tylenol as needed for pain.

## 2016-06-16 NOTE — Progress Notes (Signed)
    Subjective:  Beth Harper is a 76 y.o. female who [facility] today with a chief complaint of right shoulder pain.   HPI:  Right Shoulder Pain Symptoms started 2-3 weeks ago. No obvious precipitating events, though given patient's dementia, history is somewhat limited. Patient's son noted that she had a 100 pound trunk in her room that she had apparently moved. They have not tried any medications. She has a history of rotator cuff tendinopathy that responded to steroid injections in the past.  Dementia Per son, memory is worsening. She is also having more difficulty with sleep. Her son wants to try a medication to help with memory loss. Has been evaluated by geri clinic last year and rule out other causes of dementia.   ROS: Per HPI  PMH: Smoking history reviewed.   Objective:  Physical Exam: BP 130/86 (BP Location: Right Arm, Patient Position: Sitting, Cuff Size: Normal)   Pulse 70   Temp 97.8 F (36.6 C) (Oral)   Ht 5\' 1"  (1.549 m)   Wt 102 lb 9.6 oz (46.5 kg)   SpO2 97%   BMI 19.39 kg/m   Gen: NAD, resting comfortably MSK: -R Shoulder: No deformities. Limited to about 110 degrees of flexion and abduction due to pain. Weak with external rotation. Patient unable to tolerate  Empty can test. Nontender to palpation.   - L shoulder: No deformities. About 130 degrees of flexion and abduction. FROM with passive ROM. Negative empty can. Nontender to palpation.   Shoulder Injection Procedure Note  Pre-operative Diagnosis: right shoulder rotator cuff tendinopathy  Post-operative Diagnosis: same  Indications: Therapeutic  Anesthesia: Topical ethyl chloride  Procedure Details   Verbal consent was obtained for the procedure. The shoulder was prepped with iodine and the skin was anesthetized. Using a 22 gauge needle the subacromial space is injected with 3 mL 1% lidocaine and 1 mL of 40mg /cc depomedrol under the posterior aspect of the acromion. The injection site was  cleansed with topical isopropyl alcohol and a dressing was applied.  Complications:  None; patient tolerated the procedure well.   Assessment/Plan:  Tendinopathy of right rotator cuff Pain most likely related to rotator cuff given prior history. Subacromial injection performed today - see above note. Discussed strengthening exercises. Follow up in 4-6 weeks. Use ibuprofen or tylenol as needed for pain.   Dementia with behavioral problem Worsening per son. Will start donepezil today. Discussed possible side effects. Follow up in 4-6 weeks. If tolerating and noticing a difference, will increase dose. Otherwise will stop.   Beth Degreealeb M. Jimmey RalphParker, MD [facility] Resident PGY-3 06/16/2016 5:15 PM

## 2016-06-16 NOTE — Assessment & Plan Note (Signed)
Worsening per son. Will start donepezil today. Discussed possible side effects. Follow up in 4-6 weeks. If tolerating and noticing a difference, will increase dose. Otherwise will stop.

## 2016-06-17 NOTE — Telephone Encounter (Signed)
Letter printed and patient's son contacted. Syris Brookens,CMA

## 2016-08-10 ENCOUNTER — Other Ambulatory Visit: Payer: Self-pay | Admitting: Family Medicine

## 2016-09-02 ENCOUNTER — Ambulatory Visit: Payer: Medicare Other | Admitting: Family Medicine

## 2016-09-05 ENCOUNTER — Telehealth: Payer: Self-pay | Admitting: Family Medicine

## 2016-09-05 ENCOUNTER — Ambulatory Visit (INDEPENDENT_AMBULATORY_CARE_PROVIDER_SITE_OTHER): Payer: Medicare Other | Admitting: *Deleted

## 2016-09-05 ENCOUNTER — Telehealth: Payer: Self-pay

## 2016-09-05 ENCOUNTER — Encounter: Payer: Self-pay | Admitting: Family Medicine

## 2016-09-05 DIAGNOSIS — R399 Unspecified symptoms and signs involving the genitourinary system: Secondary | ICD-10-CM

## 2016-09-05 LAB — POCT UA - MICROSCOPIC ONLY

## 2016-09-05 LAB — POCT URINALYSIS DIP (MANUAL ENTRY)
BILIRUBIN UA: NEGATIVE
GLUCOSE UA: NEGATIVE mg/dL
Ketones, POC UA: NEGATIVE mg/dL
NITRITE UA: NEGATIVE
RBC UA: NEGATIVE
Spec Grav, UA: 1.025 (ref 1.010–1.025)
Urobilinogen, UA: 0.2 E.U./dL
pH, UA: 7 (ref 5.0–8.0)

## 2016-09-05 MED ORDER — FLUCONAZOLE 150 MG PO TABS: 150.0000 mg | ORAL_TABLET | Freq: Once | ORAL | 0 refills | Status: AC

## 2016-09-05 NOTE — Progress Notes (Signed)
Patient present 30 minutes late for appointment and was asked to reschedule but son stated that patient was in need of being seen.  Patient was precepted with Dr. Lum Babe by front office and she agreed to order a urine for patient and run it due to UTI symptoms.  Will forward to Dr. Lum Babe to sign off on encounter. Beth Harper,CMA

## 2016-09-05 NOTE — Telephone Encounter (Signed)
-----   Message from Doreene Eland, MD sent at 09/05/2016 11:53 AM EDT ----- Please contact patient that her urine microscope showed few yeast and I have e-scribed Diflucan to her pharmacy.

## 2016-09-05 NOTE — Telephone Encounter (Signed)
I got patient's urine microscope result after she already left. It was positive for few yeast. I called all the number listed on file for patient. However, no response and the other numbers were incorrect.  I will go ahead and Escribe Diflucan.  Patient is scheduled to see her PCP in two days. I will forward message to him. I will also have CMA try to call her again.  Note: Result letter was not mailed since her son mentioned she currently lives in a hotel.

## 2016-09-05 NOTE — Progress Notes (Signed)
Patient came in 45 min late to her appointment with Dr. Kennon Rounds today and was advised to reschedule. Her son got upset and stated he will not reschedule since he came all the way from [place]. I spoke with Dr. Kennon Rounds, her schedule will not be able to accommodate Beth Harper.  I had a brief discussion with her son about her lateness and how we can improve on this in the future. Since there is no other provider to see Beth Harper this morning, I recommended that she gives her urine and if positive to plan on obtaining urine culture.  Result below was discussed with her son. I advised him that if the urine culture comes back positive, I will contact him with result and treat accordingly. I also recommended that he schedule PCP follow-up appointment for her in order to address other health concern. He agreed with plan and verbalized understanding.  Urinalysis    Component Value Date/Time   COLORURINE YELLOW 03/08/2012 0313   APPEARANCEUR CLEAR 03/08/2012 0313   LABSPEC 1.015 03/08/2012 0313   PHURINE 7.0 03/08/2012 0313   GLUCOSEU NEGATIVE 03/08/2012 0313   HGBUR NEGATIVE 03/08/2012 0313   HGBUR trace-intact 02/08/2008 0856   BILIRUBINUR negative 09/05/2016 1045   BILIRUBINUR NEG 12/31/2012 0945   KETONESUR negative 09/05/2016 1045   KETONESUR NEGATIVE 03/08/2012 0313   PROTEINUR trace (A) 09/05/2016 1045   PROTEINUR NEG 12/31/2012 0945   PROTEINUR NEGATIVE 03/08/2012 0313   UROBILINOGEN 0.2 09/05/2016 1045   UROBILINOGEN 0.2 03/08/2012 0313   NITRITE Negative 09/05/2016 1045   NITRITE NEG 12/31/2012 0945   NITRITE NEGATIVE 03/08/2012 0313   LEUKOCYTESUR Trace (A) 09/05/2016 1045

## 2016-09-05 NOTE — Progress Notes (Signed)
URI.

## 2016-09-05 NOTE — Telephone Encounter (Signed)
Attempted to call pts son, however, no answer. A VM was left for him to call the office back.

## 2016-09-07 ENCOUNTER — Ambulatory Visit: Payer: Medicare Other | Admitting: Family Medicine

## 2016-09-07 ENCOUNTER — Telehealth: Payer: Self-pay | Admitting: Family Medicine

## 2016-09-07 LAB — URINE CULTURE: ORGANISM ID, BACTERIA: NO GROWTH

## 2016-09-07 NOTE — Telephone Encounter (Signed)
HIPPA compliant call back message left.   Note: Urine culture is neg.

## 2016-11-09 DIAGNOSIS — N289 Disorder of kidney and ureter, unspecified: Secondary | ICD-10-CM | POA: Diagnosis not present

## 2016-11-09 DIAGNOSIS — R402441 Other coma, without documented Glasgow coma scale score, or with partial score reported, in the field [EMT or ambulance]: Secondary | ICD-10-CM | POA: Diagnosis not present

## 2016-11-09 DIAGNOSIS — R55 Syncope and collapse: Secondary | ICD-10-CM | POA: Diagnosis not present

## 2017-02-18 ENCOUNTER — Other Ambulatory Visit: Payer: Self-pay | Admitting: Family Medicine

## 2017-02-25 ENCOUNTER — Other Ambulatory Visit: Payer: Self-pay | Admitting: Family Medicine

## 2017-04-12 ENCOUNTER — Ambulatory Visit: Payer: Medicare Other | Admitting: Internal Medicine

## 2017-05-17 ENCOUNTER — Encounter: Payer: Self-pay | Admitting: Sports Medicine

## 2017-05-17 ENCOUNTER — Ambulatory Visit (INDEPENDENT_AMBULATORY_CARE_PROVIDER_SITE_OTHER): Payer: Medicare Other | Admitting: Sports Medicine

## 2017-05-17 DIAGNOSIS — L853 Xerosis cutis: Secondary | ICD-10-CM | POA: Diagnosis not present

## 2017-05-17 DIAGNOSIS — B351 Tinea unguium: Secondary | ICD-10-CM

## 2017-05-17 DIAGNOSIS — M79676 Pain in unspecified toe(s): Secondary | ICD-10-CM | POA: Diagnosis not present

## 2017-05-17 NOTE — Progress Notes (Signed)
Patient ID: Francene CastleDorothy Gunnoe, female   DOB: [birth year], 77 y.o.   MRN: [id number] Subjective: Francene CastleDorothy Righi is a 77 y.o. female patient seen today in office with complaint of painful thickened and elongated toenails; unable to trim. Patient denies any changes since last visit. Patient is assisted by son who helps report history for mom since suffers with memory loss. Patient has no other pedal complaints at this time.   Patient Active Problem List   Diagnosis Date Noted  . Tendinopathy of right rotator cuff 06/16/2016  . Goiter   . Aortic arch atherosclerosis (HCC)   . Abdominal aortic atherosclerosis (HCC)   . Visual impairment   . Dementia with behavioral problem 11/19/2015  . 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 Urge incontinence 12/31/2012  . Vitamin B12 deficiency 06/20/2012  . Iron deficiency anemia due to chronic blood loss 06/14/2012  . Weight loss 08/29/2011  . GERD 03/04/2009  . HYPOTHYROIDISM, UNSPECIFIED 07/13/2006  . HYPERLIPIDEMIA 07/13/2006  . HYPERTENSION, BENIGN SYSTEMIC 07/13/2006    Current Outpatient Medications on File Prior to Visit  Medication Sig Dispense Refill  . acetaminophen (TYLENOL) 500 MG tablet Take 1,000 mg by mouth 2 (two) times daily. For pain    . Calcium Carbonate-Vitamin D (CALCIUM-VITAMIN D) 500-200 MG-UNIT per tablet Take 1 tablet by mouth 2 (two) times daily with a meal. 60 tablet 11  . cyanocobalamin ([facility] VITAMIN B12) 1000 MCG tablet One tablet twice daily for two weeks, then one tablet daily    . donepezil (ARICEPT) 5 MG tablet Take 1 tablet (5 mg total) by mouth at bedtime. 30 tablet 1  . levothyroxine (SYNTHROID, LEVOTHROID) 25 MCG tablet TAKE ONE TABLET BY MOUTH ONCE DAILY 90 tablet 3  . lisinopril (PRINIVIL,ZESTRIL) 20 MG tablet TAKE ONE TABLET BY MOUTH ONCE DAILY 30 tablet 2  . lisinopril (PRINIVIL,ZESTRIL) 20 MG tablet TAKE ONE TABLET BY MOUTH ONCE DAILY 30 tablet 11   No current facility-administered medications on file prior to visit.     No  Known Allergies  Objective: Physical Exam  General: Well developed, nourished, no acute distress, awake, alert and oriented x 3  Vascular: Dorsalis pedis artery 1/4 bilateral, Posterior tibial artery 1/4 bilateral, skin temperature warm to warm proximal to distal bilateral lower extremities, no varicosities, Scant pedal hair present bilateral.  Neurological: Gross sensation present via light touch bilateral.   Dermatological: Skin is warm, dry, and supple bilateral, Nails 1-10 are tender, long, thick, and discolored with moderate subungal debris, no webspace macerations present bilateral, no open lesions present bilateral, no callus/corns/hyperkeratotic tissue present bilateral. Dry skin bilateral. No signs of infection bilateral.  Musculoskeletal: No symptomatic boney deformities noted bilateral. Muscular strength within normal limits without painon range of motion. No pain with calf compression bilateral.  Assessment and Plan:  Problem List Items Addressed This Visit    None    Visit Diagnoses    Pain due to onychomycosis of toenail    -  Primary   Dry skin          -Examined patient.  -Discussed treatment options for painful mycotic nails. -Mechanically debrided and reduced mycotic nails with sterile nail nipper and dremel nail file without incident. -Recommend daily skin emollients for dry skin -Recommend good supportive shoes daily -Patient to return in 3 months for follow up evaluation or sooner if symptoms worsen.  Asencion Islamitorya Harrington Jobe, DPM

## 2017-05-19 ENCOUNTER — Encounter: Payer: Self-pay | Admitting: *Deleted

## 2017-08-09 ENCOUNTER — Ambulatory Visit: Payer: Medicare Other | Admitting: Sports Medicine

## 2017-08-16 ENCOUNTER — Ambulatory Visit: Payer: Medicare Other | Admitting: Sports Medicine

## 2017-09-15 ENCOUNTER — Ambulatory Visit: Payer: Medicare Other | Admitting: Internal Medicine

## 2017-09-15 IMAGING — CT CT HEAD W/O CM
3 of 4 series · 18 of 47 positions shown, 21 images · non-contrast
Comparison: None.

CLINICAL DATA: Dementia with behavioral problem and memory change.

EXAM:
CT HEAD WITHOUT CONTRAST
TECHNIQUE: Contiguous axial images were obtained from the base of the skull
through the vertex without intravenous contrast.

[Series 201: head w/o, idose (1) · axial · non-contrast · 0.40mm/px · z∈[+251,+371]mm · 12 of 28 slices shown, 15 images]
[im 2/28  brain]
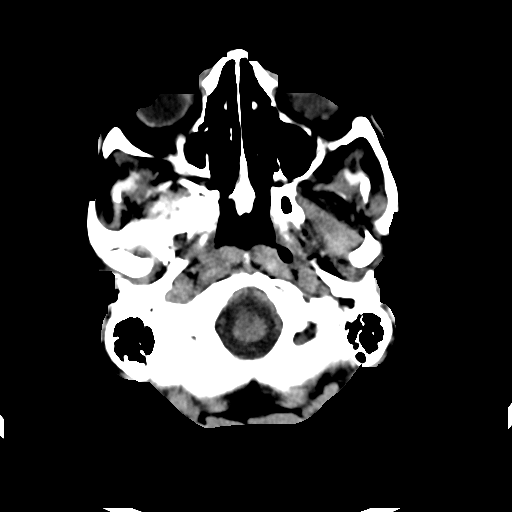
[im 2/28  bone]
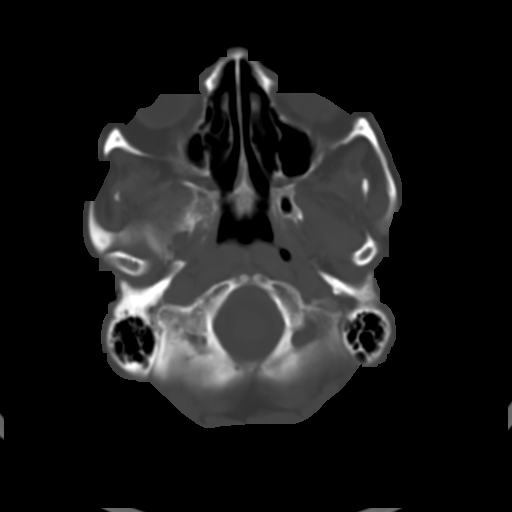
[im 4/28  brain]
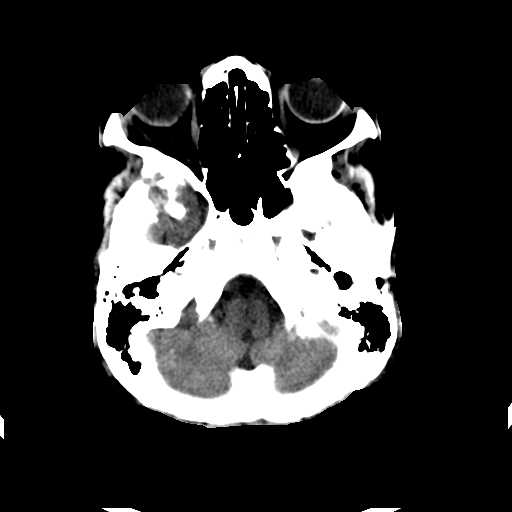
[im 6/28  brain]
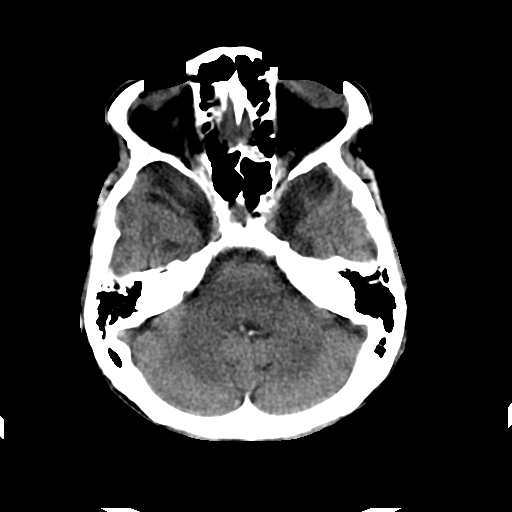
[im 8/28  brain]
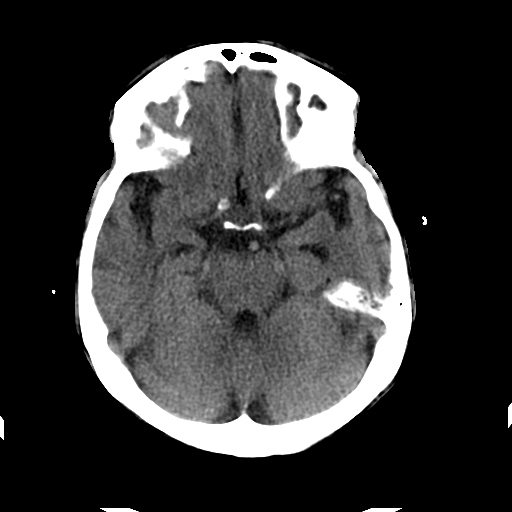
[im 10/28  brain]
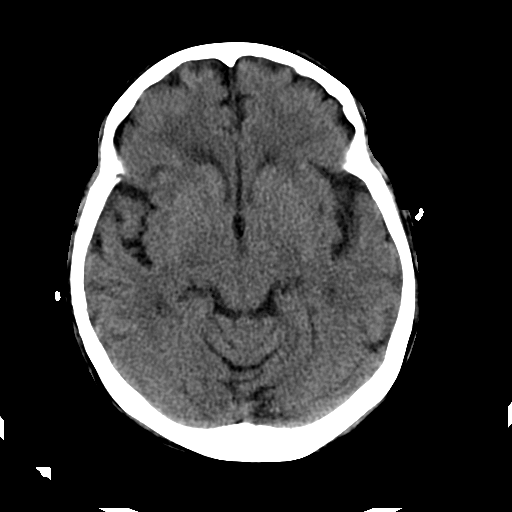
[im 10/28  bone]
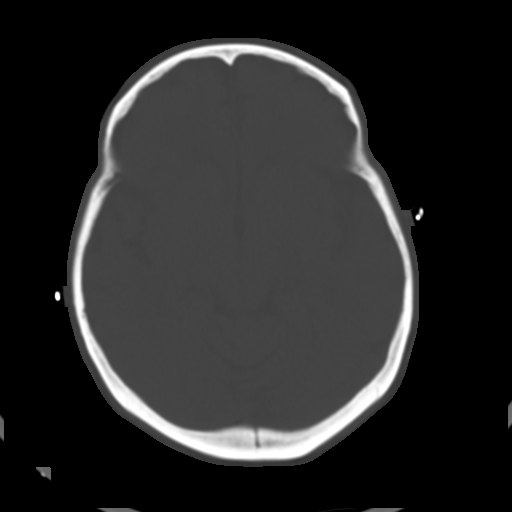
[im 12/28  brain]
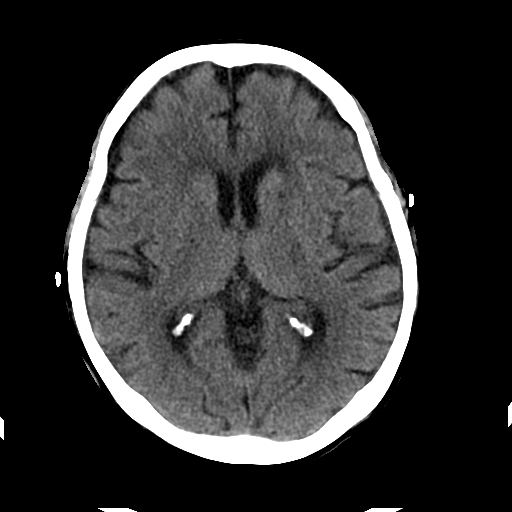
[im 16/28  brain]
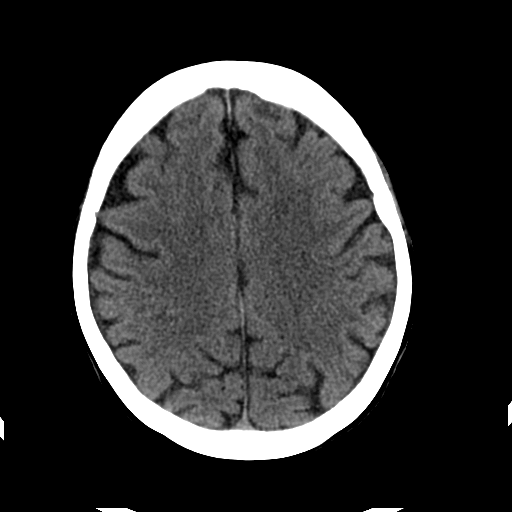
[im 18/28  brain]
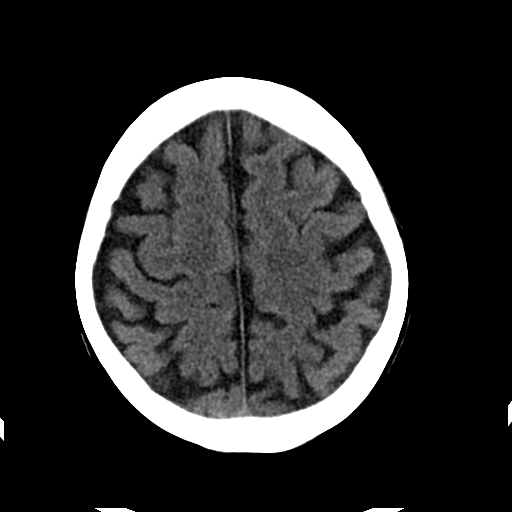
[im 20/28  brain]
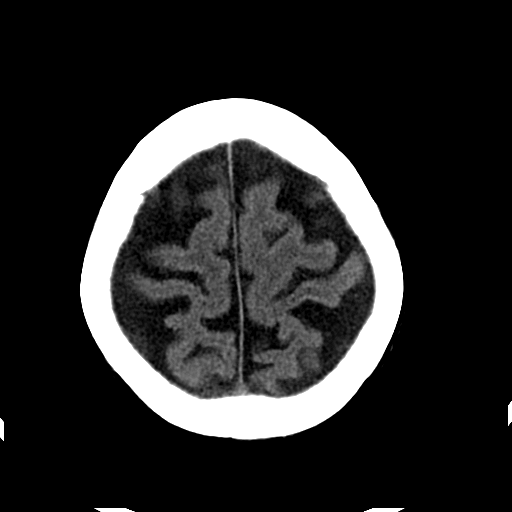
[im 20/28  bone]
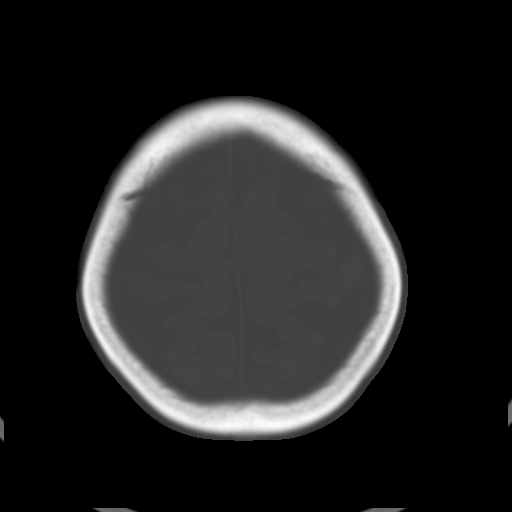
[im 22/28  brain]
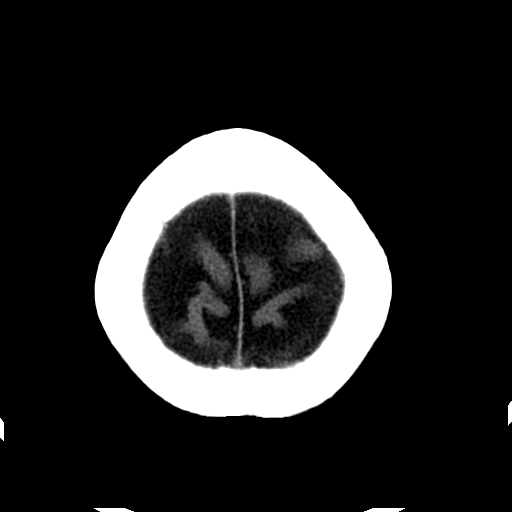
[im 24/28  brain]
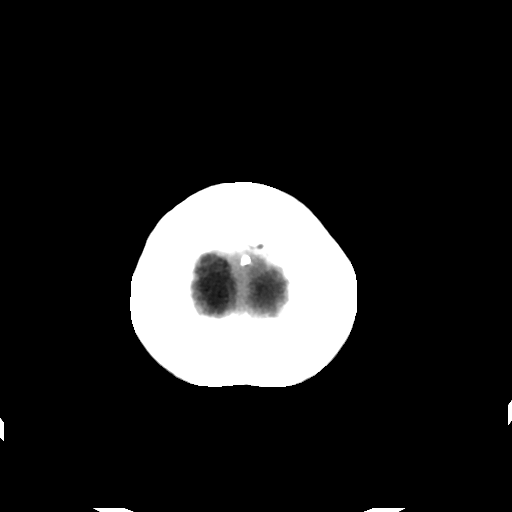
[im 26/28  brain]
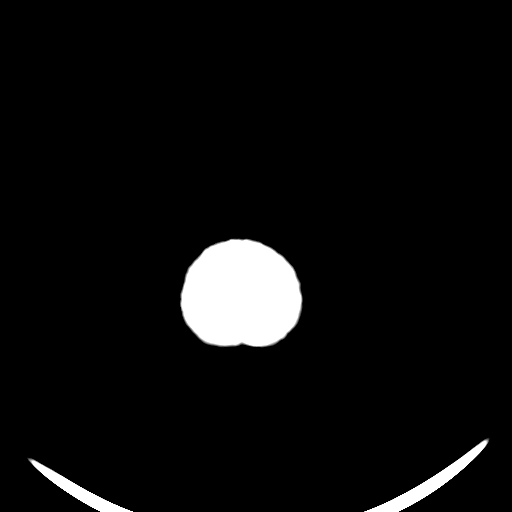

[Series 203: coronal st, idose (1) · coronal · 0.39mm/px · 3 of 63 slices shown]
[im 21/63  brain]
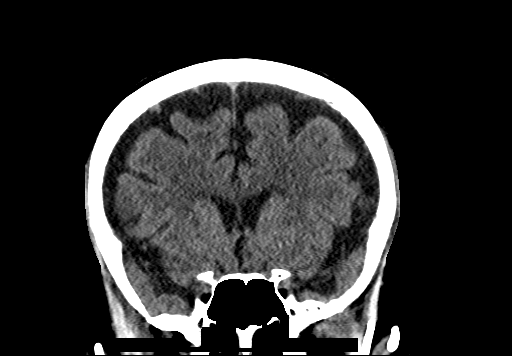
[im 28/63  brain]
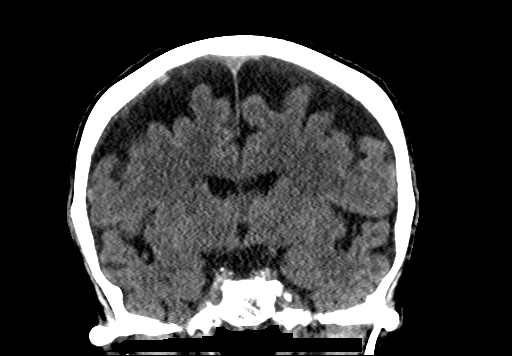
[im 35/63  brain]
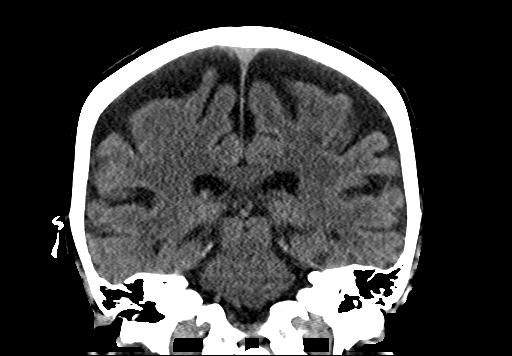

[Series 204: sagittal st, idose (1) · sagittal · 0.39mm/px · 3 of 67 slices shown]
[im 23/67  brain]
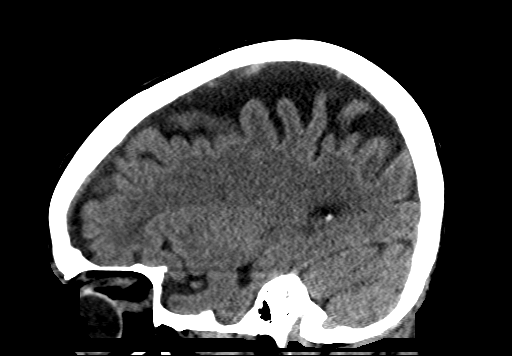
[im 34/67  brain]
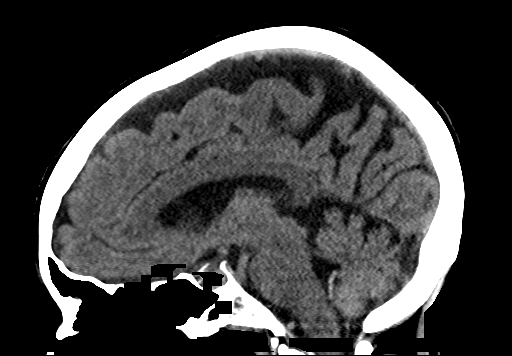
[im 45/67  brain]
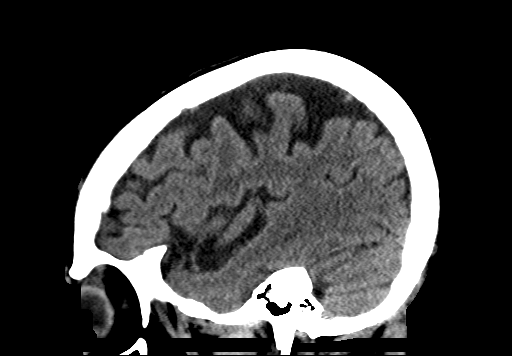

[18 of 47 positions shown; findings below may reference images not displayed]

FINDINGS: There is no evidence of intracranial hemorrhage, brain edema, or
other signs of acute infarction. There is no evidence of
intracranial mass lesion or mass effect. No abnormal extraaxial
fluid collections are identified.

Mild diffuse cerebral atrophy and chronic small vessel disease
noted. No evidence of hydrocephalus. No skull abnormality
identified.
IMPRESSION: No acute intracranial abnormality. Cerebral atrophy and chronic
small vessel disease.

## 2017-10-18 ENCOUNTER — Other Ambulatory Visit: Payer: Self-pay | Admitting: Family Medicine

## 2017-10-19 NOTE — Telephone Encounter (Signed)
Please see refill request. Thanks TLG 

## 2017-11-24 ENCOUNTER — Telehealth: Payer: Self-pay | Admitting: Family Medicine

## 2017-11-24 NOTE — Telephone Encounter (Signed)
Will forward to MD. Jazmin Hartsell,CMA  

## 2017-11-24 NOTE — Telephone Encounter (Signed)
[facility]ctive Services Social worker.  She would liek to talk with Dr Homero FellersFrank.  She wants to discuss if dr has any concerns about the care being provided by her son

## 2017-11-24 NOTE — Telephone Encounter (Signed)
Unfortunately, I have never seen this patient.  I am the primary care provider for the patient but the patient was previously seen by a different provider here at the clinic and that provider no longer works here.  Furthermore, this patient has not been seen in the clinic for over a year.  It may be helpful to point this out to illustrate that this patient has not been seen here regularly for quite some time.  Please reach out to the [employer] Social worker and explain the above information.  I am happy to read through her chart if I can address a specific questions but I cannot provide any information beyond what was written in the chart.

## 2017-11-27 NOTE — Telephone Encounter (Signed)
LM for APS to call me back regarding patient.  Did leave message on her voicemail with message from MD since it was a confidential line.  Jazmin Hartsell,CMA

## 2017-11-28 NOTE — Telephone Encounter (Signed)
Spoke with Kathalene FramesShannan regarding patient's appointment status.  She asked when patient was last seen in the office: 08/2016, who her previous PCP was: Dr. Nancy MarusMayo but she never saw her and also if she had any scheduled and missed appointments since that time.  I informed her that family had scheduled and missed 3 appointments in that time frame.  Jazmin Hartsell,CMA

## 2017-12-28 ENCOUNTER — Telehealth: Payer: Self-pay | Admitting: Licensed Clinical Social Worker

## 2017-12-28 NOTE — Progress Notes (Signed)
Type of Service: Clinical Social Work  Social work [employer]-basket consult from Environmental health practitionerfront office staff.    Pt came in office wanted to talk to social worker regarding Power of Attorney due to pt's health. Asked that social worker Please give pt a call, best phone # to contact is [phone] [employer].  LCSW called the number provided and left a voice message to call LCSW.  Plan: LCSW will wait for return call.  Sammuel Hineseborah Yaziel Brandon, LCSW Licensed Clinical Social Worker [facility]   [phone] 9:20 AM

## 2017-12-29 NOTE — Progress Notes (Addendum)
  LCSW received return call from patient's son Beth Harper ( [phone]).  States patient's bank account has been frozen due to lost debit card and they are unable to access money to pay her bills because patient does not have Power of Beth Harper. Stated he is working with Beth Harper at [facility] due to a recent Beth case that was open but has not spoken to her recently.  Not having access to the accounts is creating a hardship on the family.   LCSW provided general information about the POA process verses guardianship.  Also provided phone number to the senior legal Helpline if he had further questions and informed him he should contact his Beth Harper for additional assistance.   Beth Hineseborah Moore, LCSW Licensed Clinical Social Worker [facility]   [phone] 2:54 PM

## 2018-02-20 ENCOUNTER — Telehealth: Payer: Self-pay | Admitting: Licensed Clinical Social Worker

## 2018-02-20 NOTE — Progress Notes (Signed)
  LCSW received phone call from patient's son Beth Harper, states he is working with an attorney to get guardianship for patient hearing scheduled 03/06/18.  He is requesting copy of patient's medical records or statement from the PCP about her dementia.  Patient lives with son in [place], per son patient no longer has an active APS case.    LCSW informed son that all medical record request are handled at the front desk. Son informed LCSW he has paper work from patient's last visit and will take it to see if it meets the need.  LCSW advised son he needs to consult with his attorney on what is needed for the hearing and he will need to follow [facility] policy on requesting patient's medical information.  Plan: Son will share information that he has with the attorney and will reach out to PCP if needed  Sammuel Hines, LCSW Behavioral Health Clinician [facility]   [phone] 10:55 AM

## 2018-03-05 ENCOUNTER — Other Ambulatory Visit: Payer: Self-pay

## 2018-03-06 MED ORDER — LEVOTHYROXINE SODIUM 25 MCG PO TABS: 25.0000 ug | ORAL_TABLET | Freq: Every day | ORAL | 0 refills | Status: AC

## 2018-03-06 NOTE — Telephone Encounter (Signed)
I will prescribe a 1 month supply of levothyroxine.  This pt has not been seen in our clinic for over a year.  Please call to have her schedule an appointment to manage her medication and check her TSH level.  Mirian Mo, MD

## 2018-03-06 NOTE — Telephone Encounter (Signed)
Attempted to contact pt on both numbers listed, one said call rejected and the other said unavailable. If pt happens to call back please schedule her with Homero Fellers.

## 2018-04-21 DIAGNOSIS — R404 Transient alteration of awareness: Secondary | ICD-10-CM | POA: Diagnosis not present

## 2018-04-21 DIAGNOSIS — Z7401 Bed confinement status: Secondary | ICD-10-CM | POA: Diagnosis not present

## 2018-04-21 DIAGNOSIS — W19XXXA Unspecified fall, initial encounter: Secondary | ICD-10-CM | POA: Diagnosis not present

## 2018-04-21 DIAGNOSIS — M2550 Pain in unspecified joint: Secondary | ICD-10-CM | POA: Diagnosis not present

## 2018-04-21 DIAGNOSIS — R5381 Other malaise: Secondary | ICD-10-CM | POA: Diagnosis not present

## 2018-04-21 DIAGNOSIS — R531 Weakness: Secondary | ICD-10-CM | POA: Diagnosis not present

## 2019-12-15 DEATH — deceased

## 2023-09-08 ENCOUNTER — Other Ambulatory Visit: Payer: Self-pay
